# Patient Record
Sex: Male | Born: 1955 | Race: Black or African American | Hispanic: No | Marital: Single | Smoking: Current every day smoker
Health system: Southern US, Community
[De-identification: ages and names within clinical notes are randomized; demographics above are authoritative.]

## PROBLEM LIST (undated history)

## (undated) DIAGNOSIS — F1010 Alcohol abuse, uncomplicated: Secondary | ICD-10-CM

## (undated) DIAGNOSIS — I10 Essential (primary) hypertension: Secondary | ICD-10-CM

## (undated) DIAGNOSIS — N400 Enlarged prostate without lower urinary tract symptoms: Secondary | ICD-10-CM

## (undated) DIAGNOSIS — F1020 Alcohol dependence, uncomplicated: Secondary | ICD-10-CM

## (undated) DIAGNOSIS — K5790 Diverticulosis of intestine, part unspecified, without perforation or abscess without bleeding: Secondary | ICD-10-CM

## (undated) DIAGNOSIS — F329 Major depressive disorder, single episode, unspecified: Secondary | ICD-10-CM

---

## 2016-12-01 ENCOUNTER — Encounter: Payer: Self-pay | Admitting: Nurse Practitioner

## 2016-12-14 ENCOUNTER — Ambulatory Visit: Payer: Self-pay | Admitting: Nurse Practitioner

## 2017-05-02 ENCOUNTER — Emergency Department (HOSPITAL_COMMUNITY)
Admission: EM | Admit: 2017-05-02 | Discharge: 2017-05-02 | Disposition: A | Discharge status: Home / Self Care | Payer: Medicaid Other | Attending: Emergency Medicine | Admitting: Emergency Medicine

## 2017-05-02 ENCOUNTER — Emergency Department (HOSPITAL_COMMUNITY): Payer: Medicaid Other

## 2017-05-02 ENCOUNTER — Encounter (HOSPITAL_COMMUNITY): Payer: Self-pay | Admitting: Emergency Medicine

## 2017-05-02 DIAGNOSIS — R1030 Lower abdominal pain, unspecified: Secondary | ICD-10-CM | POA: Insufficient documentation

## 2017-05-02 DIAGNOSIS — Z79899 Other long term (current) drug therapy: Secondary | ICD-10-CM | POA: Insufficient documentation

## 2017-05-02 DIAGNOSIS — F17210 Nicotine dependence, cigarettes, uncomplicated: Secondary | ICD-10-CM | POA: Insufficient documentation

## 2017-05-02 DIAGNOSIS — I10 Essential (primary) hypertension: Secondary | ICD-10-CM | POA: Diagnosis not present

## 2017-05-02 HISTORY — DX: Benign prostatic hyperplasia without lower urinary tract symptoms: N40.0

## 2017-05-02 HISTORY — DX: Diverticulosis of intestine, part unspecified, without perforation or abscess without bleeding: K57.90

## 2017-05-02 HISTORY — DX: Essential (primary) hypertension: I10

## 2017-05-02 LAB — CBC
HEMATOCRIT: 48.3 % (ref 39.0–52.0)
HEMOGLOBIN: 16.2 g/dL (ref 13.0–17.0)
MCH: 32.5 pg (ref 26.0–34.0)
MCHC: 33.5 g/dL (ref 30.0–36.0)
MCV: 97 fL (ref 78.0–100.0)
Platelets: 140 10*3/uL — ABNORMAL LOW (ref 150–400)
RBC: 4.98 MIL/uL (ref 4.22–5.81)
RDW: 17.6 % — AB (ref 11.5–15.5)
WBC: 5.1 10*3/uL (ref 4.0–10.5)

## 2017-05-02 LAB — URINALYSIS, ROUTINE W REFLEX MICROSCOPIC
BACTERIA UA: NONE SEEN
GLUCOSE, UA: NEGATIVE mg/dL
Ketones, ur: 80 mg/dL — AB
LEUKOCYTES UA: NEGATIVE
NITRITE: NEGATIVE
PH: 5 (ref 5.0–8.0)
PROTEIN: 100 mg/dL — AB
Specific Gravity, Urine: 1.027 (ref 1.005–1.030)

## 2017-05-02 LAB — COMPREHENSIVE METABOLIC PANEL
ALT: 77 U/L — ABNORMAL HIGH (ref 17–63)
ANION GAP: 13 (ref 5–15)
AST: 111 U/L — ABNORMAL HIGH (ref 15–41)
Albumin: 4.4 g/dL (ref 3.5–5.0)
Alkaline Phosphatase: 129 U/L — ABNORMAL HIGH (ref 38–126)
BILIRUBIN TOTAL: 2.2 mg/dL — AB (ref 0.3–1.2)
BUN: 15 mg/dL (ref 6–20)
CO2: 27 mmol/L (ref 22–32)
Calcium: 9.4 mg/dL (ref 8.9–10.3)
Chloride: 94 mmol/L — ABNORMAL LOW (ref 101–111)
Creatinine, Ser: 1.34 mg/dL — ABNORMAL HIGH (ref 0.61–1.24)
GFR calc Af Amer: >60 mL/min (ref >60)
GFR, EST NON AFRICAN AMERICAN: 55 mL/min — AB (ref >60)
Glucose, Bld: 93 mg/dL (ref 65–99)
POTASSIUM: 4 mmol/L (ref 3.5–5.1)
Sodium: 134 mmol/L — ABNORMAL LOW (ref 135–145)
TOTAL PROTEIN: 8.4 g/dL — AB (ref 6.5–8.1)

## 2017-05-02 LAB — LIPASE, BLOOD: Lipase: 64 U/L — ABNORMAL HIGH (ref 11–51)

## 2017-05-02 MED ORDER — ONDANSETRON 4 MG PO TBDP
4.0000 mg | ORAL_TABLET | Freq: Once | ORAL | Status: AC | PRN
  Administered 2017-05-02: 4 mg via ORAL
  Filled 2017-05-02: qty 1

## 2017-05-02 MED ORDER — ONDANSETRON 4 MG PO TBDP: ORAL_TABLET | ORAL | 0 refills | Status: DC

## 2017-05-02 MED ORDER — PANTOPRAZOLE SODIUM 20 MG PO TBEC: 20.0000 mg | DELAYED_RELEASE_TABLET | Freq: Every day | ORAL | 0 refills | Status: DC

## 2017-05-02 MED ORDER — TRAMADOL HCL 50 MG PO TABS: 50.0000 mg | ORAL_TABLET | Freq: Four times a day (QID) | ORAL | 0 refills | Status: DC | PRN

## 2017-05-02 MED ORDER — SODIUM CHLORIDE 0.9 % IV BOLUS (SEPSIS)
1000.0000 mL | Freq: Once | INTRAVENOUS | Status: AC
  Administered 2017-05-02: 1000 mL via INTRAVENOUS

## 2017-05-02 MED ORDER — PANTOPRAZOLE SODIUM 40 MG IV SOLR
40.0000 mg | Freq: Once | INTRAVENOUS | Status: AC
  Administered 2017-05-02: 40 mg via INTRAVENOUS
  Filled 2017-05-02: qty 40

## 2017-05-02 NOTE — ED Provider Notes (Signed)
[facility]-EMERGENCY DEPT Provider Note   CSN: [id number] Arrival date & time: 05/02/17  4132     History   Chief Complaint Chief Complaint  Patient presents with  . Flank Pain  . Diarrhea    HPI Larry Byrd is a 61 y.o. male.  Patient complains of some lower abdominal pain.  He has a history of alcohol abuse and has been drinking alcohol again   The history is provided by the patient.  Flank Pain  This is a recurrent problem. The current episode started 2 days ago. The problem occurs constantly. The problem has not changed since onset.Pertinent negatives include no chest pain, no abdominal pain and no headaches. Nothing aggravates the symptoms. Nothing relieves the symptoms. He has tried nothing for the symptoms. The treatment provided no relief.  Diarrhea   Pertinent negatives include no abdominal pain, no headaches and no cough.    Past Medical History:  Diagnosis Date  . Diverticulosis   . Enlarged prostate   . Hypertension     There are no active problems to display for this patient.   History reviewed. No pertinent surgical history.     Home Medications    Prior to Admission medications   Medication Sig Start Date End Date Taking? Authorizing Provider  gabapentin (NEURONTIN) 300 MG capsule Take 300-1,200 mg by mouth as directed. Take  by mouth after lunch.  after supper, and  at bedtime 03/11/17  Yes [provider]  lisinopril-hydrochlorothiazide (PRINZIDE,ZESTORETIC) 20-12.5 MG tablet Take 1 tablet by mouth daily. 03/11/17  Yes [provider]  sertraline (ZOLOFT) 50 MG tablet Take 50 mg by mouth daily. 03/11/17  Yes [provider]  traZODone (DESYREL) 150 MG tablet Take 150-300 mg by mouth at bedtime. 03/11/17  Yes [provider]  ondansetron (ZOFRAN ODT) 4 MG disintegrating tablet  ODT q4 hours prn nausea/vomit 05/02/17   Bethann Berkshire, MD  pantoprazole (PROTONIX) 20 MG tablet Take 1 tablet (20 mg total)  by mouth daily. 05/02/17   Bethann Berkshire, MD  traMADol (ULTRAM) 50 MG tablet Take 1 tablet (50 mg total) by mouth every 6 (six) hours as needed. 05/02/17   Bethann Berkshire, MD    Family History No family history on file.  Social History Social History  Substance Use Topics  . Smoking status: Current Every Day Smoker    Types: Cigarettes  . Smokeless tobacco: Never Used  . Alcohol use Yes     Allergies   Patient has no known allergies.   Review of Systems Review of Systems  Constitutional: Negative for appetite change and fatigue.  HENT: Negative for congestion, ear discharge and sinus pressure.   Eyes: Negative for discharge.  Respiratory: Negative for cough.   Cardiovascular: Negative for chest pain.  Gastrointestinal: Positive for diarrhea. Negative for abdominal pain.  Genitourinary: Positive for flank pain. Negative for frequency and hematuria.  Musculoskeletal: Negative for back pain.  Skin: Negative for rash.  Neurological: Negative for seizures and headaches.  Psychiatric/Behavioral: Negative for hallucinations.     Physical Exam Updated Vital Signs BP (!) 156/87   Pulse 66   Temp 98.3 F (36.8 C) (Oral)   Resp 18   SpO2 100%   Physical Exam  Constitutional: He is oriented to person, place, and time. He appears well-developed.  HENT:  Head: Normocephalic.  Eyes: Conjunctivae and EOM are normal. No scleral icterus.  Neck: Neck supple. No thyromegaly present.  Cardiovascular: Normal rate and regular rhythm.  Exam reveals no gallop and  no friction rub.   No murmur heard. Pulmonary/Chest: No stridor. He has no wheezes. He has no rales. He exhibits no tenderness.  Abdominal: He exhibits no distension. There is no tenderness. There is no rebound.  Musculoskeletal: Normal range of motion. He exhibits no edema.  Lymphadenopathy:    He has no cervical adenopathy.  Neurological: He is oriented to person, place, and time. He exhibits normal muscle tone.  Coordination normal.  Skin: No rash noted. No erythema.  Psychiatric: He has a normal mood and affect. His behavior is normal.     ED Treatments / Results  Labs (all labs ordered are listed, but only abnormal results are displayed) Labs Reviewed  LIPASE, BLOOD - Abnormal; Notable for the following:       Result Value   Lipase 64 (*)    All other components within normal limits  COMPREHENSIVE METABOLIC PANEL - Abnormal; Notable for the following:    Sodium 134 (*)    Chloride 94 (*)    Creatinine, Ser 1.34 (*)    Total Protein 8.4 (*)    AST 111 (*)    ALT 77 (*)    Alkaline Phosphatase 129 (*)    Total Bilirubin 2.2 (*)    GFR calc non Af Amer 55 (*)    All other components within normal limits  CBC - Abnormal; Notable for the following:    RDW 17.6 (*)    Platelets 140 (*)    All other components within normal limits  URINALYSIS, ROUTINE W REFLEX MICROSCOPIC - Abnormal; Notable for the following:    Color, Urine AMBER (*)    APPearance HAZY (*)    Hgb urine dipstick SMALL (*)    Bilirubin Urine SMALL (*)    Ketones, ur 80 (*)    Protein, ur 100 (*)    Squamous Epithelial / LPF 0-5 (*)    All other components within normal limits    EKG  EKG Interpretation None       Radiology Ct Renal Stone Study  Result Date: 05/02/2017 CLINICAL DATA:  61 year old male with a history of flank pain and diarrhea EXAM: CT ABDOMEN AND PELVIS WITHOUT CONTRAST TECHNIQUE: Multidetector CT imaging of the abdomen and pelvis was performed following the standard protocol without IV contrast. COMPARISON:  None. FINDINGS: Lower chest: No acute abnormality. Hepatobiliary: Diffusely decreased attenuation of liver parenchyma. Unremarkable gallbladder. No intrahepatic or extrahepatic biliary ductal dilatation Pancreas: Unremarkable. No pancreatic ductal dilatation or surrounding inflammatory changes. Spleen: Normal in size without focal abnormality. Adrenals/Urinary Tract: Unremarkable appearance  of the adrenal glands. Right kidney unremarkable with no hydronephrosis. No nephrolithiasis. Unremarkable course of the right ureter. Left kidney without hydronephrosis. Nonobstructive stone at the inferior collecting system of the left kidney measures 2 mm - 3 mm. Unremarkable course of the left ureter with no inflammatory changes. Urinary bladder circumferentially thickened. Stomach/Bowel: Small hiatal hernia. Unremarkable stomach. Unremarkable appearance of small bowel without dilation. No dilated colon. Colonic diverticular change without evidence of associated inflammatory changes. Appendix is not visualized, however, no inflammatory changes are present adjacent to the cecum to indicate an appendicitis. Vascular/Lymphatic: No significant calcifications of the abdominal aorta. No adenopathy Reproductive: Transverse diameter of the prostate measures 5.0 cm. No significant calcifications. Other: No abdominal wall hernia. Musculoskeletal: Negative for acute displaced fracture. Multilevel degenerative changes of the spine. IMPRESSION: No acute intra-abdominal finding. Nonobstructive left-sided nephrolithiasis with a small 2 mm - 3 mm stone at the inferior collecting system. Circumferential urinary bladder wall  thickening may reflect cystitis, or alternatively chronic bladder outlet obstruction. Prostate diameter measures 5.0 cm. Liver steatosis. Colonic diverticular disease without evidence of acute diverticulitis. Electronically Signed   By: Gilmer Mor D.O.   On: 05/02/2017 13:10    Procedures Procedures (including critical care time)  Medications Ordered in ED Medications  ondansetron (ZOFRAN-ODT) disintegrating tablet 4 mg (4 mg Oral Given 05/02/17 0934)  sodium chloride 0.9 % bolus 1,000 mL (0 mLs Intravenous Stopped 05/02/17 1340)  pantoprazole (PROTONIX) injection 40 mg (40 mg Intravenous Given 05/02/17 1158)     Initial Impression / Assessment and Plan / ED Course  I have reviewed the triage  vital signs and the nursing notes.  Pertinent labs & imaging results that were available during my care of the patient were reviewed by me and considered in my medical decision making (see chart for details).     Patient has mild elevation of lipase. CT scan unremarkable. Patient improved with pain medicine AND medicine he will be put on protonic and will follow-up with his PCP  Final Clinical Impressions(s) / ED Diagnoses   Final diagnoses:  Lower abdominal pain    New Prescriptions New Prescriptions   ONDANSETRON (ZOFRAN ODT) 4 MG DISINTEGRATING TABLET     ODT q4 hours prn nausea/vomit   PANTOPRAZOLE (PROTONIX) 20 MG TABLET    Take 1 tablet (20 mg total) by mouth daily.   TRAMADOL (ULTRAM) 50 MG TABLET    Take 1 tablet (50 mg total) by mouth every 6 (six) hours as needed.     Bethann Berkshire, MD 05/02/17 [phone]

## 2017-05-02 NOTE — Discharge Instructions (Signed)
Follow up with your md next week for recheck.  Do not drink alcohol

## 2017-05-02 NOTE — ED Triage Notes (Signed)
Patient reports that he started back drinking ETOH after being successful in program. Patient c/o right flank pain and diarrhea for several days.

## 2017-05-02 NOTE — ED Notes (Signed)
Bed: WTR7 Expected date:  Expected time:  Means of arrival:  Comments: 

## 2017-05-02 NOTE — ED Notes (Signed)
Bed: [facility] Expected date:  Expected time:  Means of arrival:  Comments: 

## 2017-07-12 ENCOUNTER — Encounter (HOSPITAL_COMMUNITY): Payer: Self-pay | Admitting: Emergency Medicine

## 2017-07-12 DIAGNOSIS — Z79899 Other long term (current) drug therapy: Secondary | ICD-10-CM | POA: Diagnosis not present

## 2017-07-12 DIAGNOSIS — M79602 Pain in left arm: Secondary | ICD-10-CM | POA: Insufficient documentation

## 2017-07-12 DIAGNOSIS — E876 Hypokalemia: Secondary | ICD-10-CM | POA: Diagnosis not present

## 2017-07-12 DIAGNOSIS — I10 Essential (primary) hypertension: Secondary | ICD-10-CM | POA: Insufficient documentation

## 2017-07-12 DIAGNOSIS — F17210 Nicotine dependence, cigarettes, uncomplicated: Secondary | ICD-10-CM | POA: Diagnosis not present

## 2017-07-12 DIAGNOSIS — R51 Headache: Secondary | ICD-10-CM | POA: Insufficient documentation

## 2017-07-12 LAB — COMPREHENSIVE METABOLIC PANEL
ALBUMIN: 4.2 g/dL (ref 3.5–5.0)
ALT: 22 U/L (ref 17–63)
ANION GAP: 14 (ref 5–15)
AST: 45 U/L — ABNORMAL HIGH (ref 15–41)
Alkaline Phosphatase: 110 U/L (ref 38–126)
BILIRUBIN TOTAL: 1 mg/dL (ref 0.3–1.2)
BUN: 10 mg/dL (ref 6–20)
CO2: 24 mmol/L (ref 22–32)
Calcium: 9.3 mg/dL (ref 8.9–10.3)
Chloride: 96 mmol/L — ABNORMAL LOW (ref 101–111)
Creatinine, Ser: 1.93 mg/dL — ABNORMAL HIGH (ref 0.61–1.24)
GFR calc Af Amer: 41 mL/min — ABNORMAL LOW (ref >60)
GFR calc non Af Amer: 36 mL/min — ABNORMAL LOW (ref >60)
GLUCOSE: 128 mg/dL — AB (ref 65–99)
POTASSIUM: 3 mmol/L — AB (ref 3.5–5.1)
Sodium: 134 mmol/L — ABNORMAL LOW (ref 135–145)
TOTAL PROTEIN: 7.7 g/dL (ref 6.5–8.1)

## 2017-07-12 LAB — CBC
HEMATOCRIT: 43.1 % (ref 39.0–52.0)
HEMOGLOBIN: 14.8 g/dL (ref 13.0–17.0)
MCH: 33.3 pg (ref 26.0–34.0)
MCHC: 34.3 g/dL (ref 30.0–36.0)
MCV: 97.1 fL (ref 78.0–100.0)
Platelets: 191 10*3/uL (ref 150–400)
RBC: 4.44 MIL/uL (ref 4.22–5.81)
RDW: 15.6 % — ABNORMAL HIGH (ref 11.5–15.5)
WBC: 5.5 10*3/uL (ref 4.0–10.5)

## 2017-07-12 LAB — URINALYSIS, ROUTINE W REFLEX MICROSCOPIC
Bacteria, UA: NONE SEEN
Bilirubin Urine: NEGATIVE
Glucose, UA: 150 mg/dL — AB
KETONES UR: NEGATIVE mg/dL
Leukocytes, UA: NEGATIVE
NITRITE: NEGATIVE
PROTEIN: 30 mg/dL — AB
Specific Gravity, Urine: 1.016 (ref 1.005–1.030)
pH: 5 (ref 5.0–8.0)

## 2017-07-12 LAB — LIPASE, BLOOD: Lipase: 41 U/L (ref 11–51)

## 2017-07-12 NOTE — ED Triage Notes (Signed)
Pt presents with multiple complaints, states he has had rectal bleeding X several months, reports L hand pain states he has tendonitis, also reports HA. States he wants a new PCP and has came here until then.

## 2017-07-13 ENCOUNTER — Emergency Department (HOSPITAL_COMMUNITY)
Admission: EM | Admit: 2017-07-13 | Discharge: 2017-07-13 | Disposition: A | Discharge status: Home / Self Care | Payer: Medicaid Other | Attending: Emergency Medicine | Admitting: Emergency Medicine

## 2017-07-13 DIAGNOSIS — R51 Headache: Secondary | ICD-10-CM

## 2017-07-13 DIAGNOSIS — G8929 Other chronic pain: Secondary | ICD-10-CM

## 2017-07-13 DIAGNOSIS — M79602 Pain in left arm: Secondary | ICD-10-CM

## 2017-07-13 DIAGNOSIS — E876 Hypokalemia: Secondary | ICD-10-CM

## 2017-07-13 LAB — POC OCCULT BLOOD, ED: Fecal Occult Bld: NEGATIVE

## 2017-07-13 MED ORDER — GABAPENTIN 300 MG PO CAPS
300.0000 mg | ORAL_CAPSULE | Freq: Once | ORAL | Status: AC
  Administered 2017-07-13: 300 mg via ORAL
  Filled 2017-07-13: qty 1

## 2017-07-13 MED ORDER — HYDROCODONE-ACETAMINOPHEN 5-325 MG PO TABS
1.0000 | ORAL_TABLET | Freq: Once | ORAL | Status: AC
  Administered 2017-07-13: 1 via ORAL
  Filled 2017-07-13: qty 1

## 2017-07-13 MED ORDER — POTASSIUM CHLORIDE CRYS ER 20 MEQ PO TBCR: 40.0000 meq | EXTENDED_RELEASE_TABLET | Freq: Every day | ORAL | 0 refills | Status: DC

## 2017-07-13 NOTE — ED Provider Notes (Signed)
[facility]   CSN: [id number] Arrival date & time: 07/12/17  2015     History   Chief Complaint Chief Complaint  Patient presents with  . Multiple Complaints    HPI Gillermina PhyWilliam Bamber is a 61 y.o. male.  HPI  This is a 61 year old male with a history of diverticulosis hypertension who presents with multiple complaints.  Patient initially complains of left arm pain.  He reports shooting pain from his elbow to his wrist.  He states that he was diagnosed with tendinitis by his primary physician.  He was given a cream that seemed to help some.  However, the pain returns.  Patient also reports multiple months of bloody stools.  He states that it is streaks of blood.  Denies any abdominal pain, nausea, vomiting.  Denies any dark tarry stools.  History of diverticulosis but has not seen a GI doctor.  Denies any dizziness or syncope.  Patient also complains of left-sided headache.  This is been ongoing for several months.  He states "I feel like something is in my ear."  Patient rates pain at 8 out of 10.  He has not taken anything additional for pain.  When asked whether he has seen his primary physician.  He states that "I do not feel like he is doing anything for me."  Past Medical History:  Diagnosis Date  . Diverticulosis   . Enlarged prostate   . Hypertension     There are no active problems to display for this patient.   History reviewed. No pertinent surgical history.     Home Medications    Prior to Admission medications   Medication Sig Start Date End Date Taking? Authorizing Provider  gabapentin (NEURONTIN) 300 MG capsule Take 300-1,200 mg by mouth as directed. Take 300mg  by mouth after lunch. 300mg  after supper, and 1200mg  at bedtime 03/11/17  Yes [provider]  lisinopril-hydrochlorothiazide (PRINZIDE,ZESTORETIC) 20-12.5 MG tablet Take 1 tablet by mouth daily. 03/11/17  Yes [provider]    pantoprazole (PROTONIX) 20 MG tablet Take 1 tablet (20 mg total) by mouth daily. 05/02/17  Yes Bethann BerkshireZammit, Joseph, MD  sertraline (ZOLOFT) 50 MG tablet Take 50 mg by mouth daily. 03/11/17  Yes [provider]  traZODone (DESYREL) 150 MG tablet Take 150-300 mg by mouth at bedtime. 03/11/17  Yes [provider]  ondansetron (ZOFRAN ODT) 4 MG disintegrating tablet 4mg  ODT q4 hours prn nausea/vomit Patient not taking: Reported on 07/13/2017 05/02/17   Bethann BerkshireZammit, Joseph, MD  potassium chloride SA (K-DUR,KLOR-CON) 20 MEQ tablet Take 2 tablets (40 mEq total) by mouth daily. 07/13/17   Zylah Elsbernd, Mayer Maskerourtney F, MD  traMADol (ULTRAM) 50 MG tablet Take 1 tablet (50 mg total) by mouth every 6 (six) hours as needed. Patient not taking: Reported on 07/13/2017 05/02/17   Bethann BerkshireZammit, Joseph, MD    Family History No family history on file.  Social History Social History   Tobacco Use  . Smoking status: Current Every Day Smoker    Types: Cigarettes  . Smokeless tobacco: Never Used  Substance Use Topics  . Alcohol use: Yes  . Drug use: Not on file     Allergies   Patient has no known allergies.   Review of Systems Review of Systems  Constitutional: Negative for fever.  HENT: Positive for ear pain.   Respiratory: Negative for shortness of breath.   Cardiovascular: Negative for chest pain.  Gastrointestinal: Positive for blood in stool. Negative for abdominal  pain, constipation, diarrhea and vomiting.  Musculoskeletal:       Left arm pain  Neurological: Positive for headaches.  All other systems reviewed and are negative.    Physical Exam Updated Vital Signs BP (!) 148/87   Pulse 84   Temp 97.8 F (36.6 C) (Oral)   Resp 18   Ht 5\' 7"  (1.702 m)   Wt 68 kg (150 lb)   SpO2 100%   BMI 23.49 kg/m   Physical Exam  Constitutional: He is oriented to person, place, and time. He appears well-developed and well-nourished. No distress.  HENT:  Head: Normocephalic and atraumatic.  Right  Ear: External ear normal.  Left Ear: External ear normal.  TMs clear, no foreign bodies  Eyes: EOM are normal. Pupils are equal, round, and reactive to light.  Neck: Neck supple.  Cardiovascular: Normal rate, regular rhythm and normal heart sounds.  No murmur heard. Pulmonary/Chest: Effort normal and breath sounds normal. No respiratory distress. He has no wheezes.  Abdominal: Soft. Bowel sounds are normal. There is no tenderness. There is no rebound.  Genitourinary:  Genitourinary Comments: Normal rectal tone, no gross blood  Musculoskeletal: He exhibits no edema.  Normal range of motion of the left elbow and wrist, no obvious deformities, no overlying skin changes, 2+ radial pulse  Neurological: He is alert and oriented to person, place, and time.  Skin: Skin is warm and dry.  Psychiatric: He has a normal mood and affect.  Nursing note and vitals reviewed.    ED Treatments / Results  Labs (all labs ordered are listed, but only abnormal results are displayed) Labs Reviewed  COMPREHENSIVE METABOLIC PANEL - Abnormal; Notable for the following components:      Result Value   Sodium 134 (*)    Potassium 3.0 (*)    Chloride 96 (*)    Glucose, Bld 128 (*)    Creatinine, Ser 1.93 (*)    AST 45 (*)    GFR calc non Af Amer 36 (*)    GFR calc Af Amer 41 (*)    All other components within normal limits  CBC - Abnormal; Notable for the following components:   RDW 15.6 (*)    All other components within normal limits  URINALYSIS, ROUTINE W REFLEX MICROSCOPIC - Abnormal; Notable for the following components:   APPearance HAZY (*)    Glucose, UA 150 (*)    Hgb urine dipstick MODERATE (*)    Protein, ur 30 (*)    Squamous Epithelial / LPF 0-5 (*)    All other components within normal limits  LIPASE, BLOOD  POC OCCULT BLOOD, ED    EKG  EKG Interpretation None       Radiology No results found.  Procedures Procedures (including critical care time)  Medications Ordered in  ED Medications  HYDROcodone-acetaminophen (NORCO/VICODIN) 5-325 MG per tablet 1 tablet (1 tablet Oral Given 07/13/17 0622)  gabapentin (NEURONTIN) capsule 300 mg (300 mg Oral Given 07/13/17 [id number])     Initial Impression / Assessment and Plan / ED Course  I have reviewed the triage vital signs and the nursing notes.  Pertinent labs & imaging results that were available during my care of the patient were reviewed by me and considered in my medical decision making (see chart for details).     Patient presents with multiple complaints.  Most of them appear chronic in nature.  His exam is reassuring.  He has no gross blood and his hemoglobin is stable.  He does have mild hypokalemia which is likely unrelated.  Patient was given Norco and gabapentin.  Doubt acute emergent process.  Patient was given 5 days of potassium supplementation.  Recommend follow-up with PCP.  After history, exam, and medical workup I feel the patient has been appropriately medically screened and is safe for discharge home. Pertinent diagnoses were discussed with the patient. Patient was given return precautions.   Final Clinical Impressions(s) / ED Diagnoses   Final diagnoses:  Chronic nonintractable headache, unspecified headache type  Left arm pain  Hypokalemia    ED Discharge Orders        Ordered    potassium chloride SA (K-DUR,KLOR-CON) 20 MEQ tablet  Daily     07/13/17 0637       Shon Baton, MD 07/13/17 [phone]

## 2017-07-13 NOTE — Discharge Instructions (Signed)
You were seen today for multiple complaints.  Your testing for blood in her stools was negative.  Your hemoglobin is reassuring.  Your potassium was mildly diminished.  Take potassium supplementation.  Follow-up with your primary physician for recheck.  Most of your complaints are chronic in nature.  Close follow-up with primary physician and continue medications as directed.

## 2017-07-14 ENCOUNTER — Encounter (HOSPITAL_COMMUNITY): Payer: Self-pay | Admitting: Emergency Medicine

## 2017-07-14 DIAGNOSIS — M79602 Pain in left arm: Secondary | ICD-10-CM | POA: Insufficient documentation

## 2017-07-14 DIAGNOSIS — F17210 Nicotine dependence, cigarettes, uncomplicated: Secondary | ICD-10-CM | POA: Insufficient documentation

## 2017-07-14 DIAGNOSIS — M25562 Pain in left knee: Secondary | ICD-10-CM | POA: Diagnosis present

## 2017-07-14 DIAGNOSIS — R51 Headache: Secondary | ICD-10-CM | POA: Diagnosis not present

## 2017-07-14 DIAGNOSIS — I10 Essential (primary) hypertension: Secondary | ICD-10-CM | POA: Insufficient documentation

## 2017-07-14 DIAGNOSIS — Z760 Encounter for issue of repeat prescription: Secondary | ICD-10-CM | POA: Diagnosis not present

## 2017-07-14 DIAGNOSIS — Z79899 Other long term (current) drug therapy: Secondary | ICD-10-CM | POA: Insufficient documentation

## 2017-07-14 DIAGNOSIS — G8929 Other chronic pain: Secondary | ICD-10-CM | POA: Diagnosis not present

## 2017-07-14 NOTE — ED Triage Notes (Addendum)
Patient c/o right arm pain that "has been addressed by many doctors." pt also having left knee pain and tingling. Pt states he was told he has tinnitus. Pt adds he has an intermittent headache that has been going on for a while but does not want any blood work done but thinks he needs a scan of his head.

## 2017-07-15 ENCOUNTER — Emergency Department (HOSPITAL_COMMUNITY): Payer: Medicaid Other

## 2017-07-15 ENCOUNTER — Emergency Department (HOSPITAL_COMMUNITY)
Admission: EM | Admit: 2017-07-15 | Discharge: 2017-07-15 | Disposition: A | Discharge status: Home / Self Care | Payer: Medicaid Other | Attending: Emergency Medicine | Admitting: Emergency Medicine

## 2017-07-15 DIAGNOSIS — M25562 Pain in left knee: Secondary | ICD-10-CM

## 2017-07-15 DIAGNOSIS — M79602 Pain in left arm: Secondary | ICD-10-CM

## 2017-07-15 DIAGNOSIS — Z760 Encounter for issue of repeat prescription: Secondary | ICD-10-CM

## 2017-07-15 DIAGNOSIS — G8929 Other chronic pain: Secondary | ICD-10-CM

## 2017-07-15 DIAGNOSIS — R51 Headache: Secondary | ICD-10-CM

## 2017-07-15 MED ORDER — LISINOPRIL-HYDROCHLOROTHIAZIDE 20-12.5 MG PO TABS: 1.0000 | ORAL_TABLET | Freq: Every day | ORAL | 0 refills | Status: DC

## 2017-07-15 MED ORDER — KETOROLAC TROMETHAMINE 30 MG/ML IJ SOLN
30.0000 mg | Freq: Once | INTRAMUSCULAR | Status: AC
  Administered 2017-07-15: 30 mg via INTRAMUSCULAR
  Filled 2017-07-15: qty 1

## 2017-07-15 NOTE — ED Notes (Signed)
Pt is c/o left arm pain and left leg pain. Pt is ambulatory. Pt stated he fell a month ago and then developed this pain. PMS in tact.

## 2017-07-15 NOTE — ED Provider Notes (Signed)
[facility]-EMERGENCY DEPT Provider Note   CSN: [id number] Arrival date & time: 07/14/17  2238     History   Chief Complaint Chief Complaint  Patient presents with  . Arm Pain  . Knee Pain  . Headache    HPI Larry Byrd is a 61 y.o. male with a hx of HTN, diverticulosis, enlarged prostate presents to the Emergency Department complaining of gradual, persistent, progressively worsening left arm pain onset more than 1 month ago.  Pt describes the pain as burning and shooting.  Pt reports he has been diagnosed with tendonitis by a physician.  Pt reports taking gabapentin for his pain wihtout relief.  (He reports he is only taking this intermittently and is not taking it as prescribed.) He reports that sometimes the pain is so bad he has trouble using the arm and drops objects, but denies weakness without the pain.  Nothing seems to make the pain worse.    Pt also reports left knee pain that sometimes "gives out."  Pt reports injuring the left knee approx 1 month ago and has had pain since that time.  He reports the initial injury was from a fall, but since that time he has had trouble with near falls due to this.  He denies additional injury to the left knee.  He reports standing for long periods of time makes the knee pain worse.  He reports taking ibuprofen sometimes for the pain and inflammation. Last dose was yesterday.  Pt reports left ear pain as well.  He reports some pain in his temple.  Pt reports this pain has been ongoing for more than 1 month.   He does report Aleve improves the pain.  Pt denies fever, chills, neck pain, chest pain, SOB, N/V/D, vision changes, speech changes, sensation changes.   Pt also reports he has not taken his BP medications in the last 4 days as he recently moved and he had to leave all his belongings.  Upon questioning he does admit to drinking alcohol tonight.   The history is provided by medical records and the patient. No  language interpreter was used.    Past Medical History:  Diagnosis Date  . Diverticulosis   . Enlarged prostate   . Hypertension     There are no active problems to display for this patient.   History reviewed. No pertinent surgical history.     Home Medications    Prior to Admission medications   Medication Sig Start Date End Date Taking? Authorizing Provider  cetirizine (ZYRTEC) 10 MG tablet Take 10 mg by mouth daily.   Yes [provider]  gabapentin (NEURONTIN) 300 MG capsule Take 300-1,200 mg by mouth as directed. Take 300mg  by mouth after lunch. 300mg  after supper, and 1200mg  at bedtime 03/11/17  Yes [provider]  pantoprazole (PROTONIX) 20 MG tablet Take 1 tablet (20 mg total) by mouth daily. 05/02/17  Yes Bethann Berkshire, MD  potassium chloride SA (K-DUR,KLOR-CON) 20 MEQ tablet Take 2 tablets (40 mEq total) by mouth daily. 07/13/17  Yes Horton, Mayer Masker, MD  sertraline (ZOLOFT) 50 MG tablet Take 50 mg by mouth daily. 03/11/17  Yes [provider]  traZODone (DESYREL) 150 MG tablet Take 150-300 mg by mouth at bedtime. 03/11/17  Yes [provider]  lisinopril-hydrochlorothiazide (PRINZIDE,ZESTORETIC) 20-12.5 MG tablet Take 1 tablet by mouth daily. 07/15/17   Benjamim Harnish, Dahlia Client, PA-C    Family History No family history on file.  Social History Social History  Tobacco Use  . Smoking status: Current Every Day Smoker    Types: Cigarettes  . Smokeless tobacco: Never Used  Substance Use Topics  . Alcohol use: Yes  . Drug use: Not on file     Allergies   Patient has no known allergies.   Review of Systems Review of Systems  Constitutional: Negative for appetite change, diaphoresis, fatigue, fever and unexpected weight change.  HENT: Negative for mouth sores.   Eyes: Negative for visual disturbance.  Respiratory: Negative for cough, chest tightness, shortness of breath and wheezing.   Cardiovascular: Negative for chest  pain.  Gastrointestinal: Negative for abdominal pain, constipation, diarrhea, nausea and vomiting.  Endocrine: Negative for polydipsia, polyphagia and polyuria.  Genitourinary: Negative for dysuria, frequency, hematuria and urgency.  Musculoskeletal: Positive for arthralgias and myalgias. Negative for back pain and neck stiffness.  Skin: Negative for rash.  Allergic/Immunologic: Negative for immunocompromised state.  Neurological: Positive for headaches. Negative for syncope and light-headedness.  Hematological: Does not bruise/bleed easily.  Psychiatric/Behavioral: Negative for sleep disturbance. The patient is not nervous/anxious.      Physical Exam Updated Vital Signs BP 119/85 (BP Location: Left Arm)   Pulse 75   Temp 97.8 F (36.6 C) (Oral)   Resp 18   SpO2 100%   Physical Exam  Constitutional: He appears well-developed and well-nourished. No distress.  Awake, alert, nontoxic appearance  HENT:  Head: Normocephalic and atraumatic.  Mouth/Throat: Oropharynx is clear and moist. No oropharyngeal exudate.  TMs clear bilaterally without effusion or foreign bodies Left temporal area without increased warmth, rash, erythema or tenderness.  No bruit.  Eyes: Conjunctivae are normal. No scleral icterus.  Neck: Normal range of motion. Neck supple.  Cardiovascular: Normal rate, regular rhythm and intact distal pulses.  Pulmonary/Chest: Effort normal and breath sounds normal. No respiratory distress. He has no wheezes.  Equal chest expansion  Abdominal: Soft. Bowel sounds are normal. He exhibits no mass. There is no tenderness. There is no rebound and no guarding.  Musculoskeletal: Normal range of motion. He exhibits no edema.  Full range of motion of the left shoulder, elbow, wrist and fingers.  Sensation intact and normal touch in the left upper extremity.  Upper extremity strength and grip strength 5/5 in the left.  No overlying skin changes.  Left lower extremity with full range of  motion of the left hip, knee and ankle.  No abnormal patellar movement or defect of the patellar tendon.  Patient is able to fully extend and flex.  Sensation intact to normal touch in the left lower extremity.  Strength 5/5 with flexion and extension.  Normal gait.  No overlying skin changes.  Neurological: He is alert.  Speech is clear and goal oriented Moves extremities without ataxia  Skin: Skin is warm and dry. He is not diaphoretic.  Psychiatric: He has a normal mood and affect.  Nursing note and vitals reviewed.    ED Treatments / Results   Radiology Dg Knee Complete 4 Views Left  Result Date: 07/15/2017 CLINICAL DATA:  Left knee pain after fall and twisting injury 1 month ago. EXAM: LEFT KNEE - COMPLETE 4+ VIEW COMPARISON:  None. FINDINGS: No evidence of fracture, dislocation, or joint effusion. Minimal spurring of tibial spines. Trace peripheral lateral tibiofemoral spurring. Joint spaces are preserved. Small patellar and quadriceps tendon enthesophytes. Soft tissues are unremarkable. 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  No fracture or subluxation. Electronically Signed   By: Rubye OaksMelanie  Ehinger M.D.   On: 07/15/2017 05:14  Procedures Procedures (including critical care time)  Medications Ordered in ED Medications  ketorolac (TORADOL) 30 MG/ML injection 30 mg (30 mg Intramuscular Given 07/15/17 0442)     Initial Impression / Assessment and Plan / ED Course  I have reviewed the triage vital signs and the nursing notes.  Pertinent labs & imaging results that were available during my care of the patient were reviewed by me and considered in my medical decision making (see chart for details).     Patient presents with numerous complaints.  They appear chronic in nature and all have been ongoing for greater than 1 month.  Physical exam of the left upper and left lower extremity are reassuring.  X-ray of the left knee is without acute abnormalities and  osteoarthritis is noted.  No acute trauma.  No evidence of septic joint.  Patient given knee brace.  He has been drinking and I am concerned that this may be a regular occurrence for him.  As of such, will not give NSAIDs or Tylenol.  Other conservative therapies discussed with patient.  He does continue to have a prescription for gabapentin.  I have instructed him on the importance of taking this as directed.  Patient also with mild headache, present for > 1 month.  Neurologically intact.  No clinical evidence of giant cell arteritis.  No clinical evidence of shingles.  No evidence of acute emergent process.  Patient complains of hypertension as he has not been taking his medications.  He is not hypertensive here today.  I however have refilled his hypertension medication as he is unable to get to them.  I have instructed the patient to follow-up with his primary care provider for further evaluation and treatment.  Final Clinical Impressions(s) / ED Diagnoses   Final diagnoses:  Chronic pain of left knee  Pain of left upper extremity  Medication refill  Chronic nonintractable headache, unspecified headache type    ED Discharge Orders        Ordered    lisinopril-hydrochlorothiazide (PRINZIDE,ZESTORETIC) 20-12.5 MG tablet  Daily     07/15/17 0556       Bernie Fobes, Dahlia ClientHannah, PA-C 07/15/17 0601    Palumbo, April, MD 07/15/17 [phone]

## 2017-07-15 NOTE — Discharge Instructions (Addendum)
1. Medications: usual home medications 2. Treatment: rest, alternate ice and heat, elevate and use brace, drink plenty of fluids, gentle stretching 3. Follow Up: Please followup with your PCP in 1 week if no improvement for discussion of your diagnoses and further evaluation after today's visit; if you do not have a primary care doctor use the resource guide provided to find one; Please return to the ER for worsening symptoms or other concerns

## 2017-08-10 ENCOUNTER — Encounter (HOSPITAL_COMMUNITY): Payer: Self-pay | Admitting: Emergency Medicine

## 2017-08-10 DIAGNOSIS — R51 Headache: Secondary | ICD-10-CM | POA: Insufficient documentation

## 2017-08-10 DIAGNOSIS — F17210 Nicotine dependence, cigarettes, uncomplicated: Secondary | ICD-10-CM | POA: Diagnosis not present

## 2017-08-10 DIAGNOSIS — Z79899 Other long term (current) drug therapy: Secondary | ICD-10-CM | POA: Insufficient documentation

## 2017-08-10 DIAGNOSIS — J069 Acute upper respiratory infection, unspecified: Secondary | ICD-10-CM | POA: Diagnosis not present

## 2017-08-10 DIAGNOSIS — I10 Essential (primary) hypertension: Secondary | ICD-10-CM | POA: Insufficient documentation

## 2017-08-10 DIAGNOSIS — B9789 Other viral agents as the cause of diseases classified elsewhere: Secondary | ICD-10-CM | POA: Insufficient documentation

## 2017-08-10 DIAGNOSIS — M546 Pain in thoracic spine: Secondary | ICD-10-CM | POA: Diagnosis present

## 2017-08-10 NOTE — ED Triage Notes (Signed)
Pt comes in with complaints of chronic back pain and chronic headaches.  Describes the headache as a knife being stabbed in his head. States no pain medications are helping.  Pt also reports he has had some thick sputum that he has been spit up. States he is unsure if he has been around any mold. [facility].

## 2017-08-11 ENCOUNTER — Emergency Department (HOSPITAL_COMMUNITY): Payer: Medicaid Other

## 2017-08-11 ENCOUNTER — Emergency Department (HOSPITAL_COMMUNITY)
Admission: EM | Admit: 2017-08-11 | Discharge: 2017-08-11 | Disposition: A | Discharge status: Home / Self Care | Payer: Medicaid Other | Attending: Emergency Medicine | Admitting: Emergency Medicine

## 2017-08-11 DIAGNOSIS — J069 Acute upper respiratory infection, unspecified: Secondary | ICD-10-CM

## 2017-08-11 MED ORDER — POTASSIUM CHLORIDE CRYS ER 20 MEQ PO TBCR: 40.0000 meq | EXTENDED_RELEASE_TABLET | Freq: Every day | ORAL | 0 refills | Status: DC

## 2017-08-11 MED ORDER — BENZONATATE 100 MG PO CAPS: 100.0000 mg | ORAL_CAPSULE | Freq: Three times a day (TID) | ORAL | 0 refills | Status: DC

## 2017-08-11 MED ORDER — LISINOPRIL-HYDROCHLOROTHIAZIDE 20-12.5 MG PO TABS: 1.0000 | ORAL_TABLET | Freq: Every day | ORAL | 0 refills | Status: DC

## 2017-08-11 NOTE — ED Notes (Signed)
Called  No response from lobby 

## 2017-08-11 NOTE — ED Provider Notes (Signed)
[facility]-EMERGENCY DEPT Provider Note   CSN: [id number] Arrival date & time: 08/10/17  2251     History   Chief Complaint Chief Complaint  Patient presents with  . Headache  . Back Pain    HPI Larry Byrd is a 62 y.o. male.  The history is provided by the patient. No language interpreter was used.  Headache   This is a new problem. The problem occurs constantly. The problem has been gradually worsening. The headache is associated with nothing. The quality of the pain is described as dull. The pain does not radiate. Pertinent negatives include no nausea and no vomiting. He has tried nothing for the symptoms. The treatment provided no relief.  Back Pain   Associated symptoms include headaches.  Pt reports he has been coughing and has a headache.  Pt reports he is out of his blood pressure medication and his potassium.   Past Medical History:  Diagnosis Date  . Diverticulosis   . Enlarged prostate   . Hypertension     There are no active problems to display for this patient.   History reviewed. No pertinent surgical history.     Home Medications    Prior to Admission medications   Medication Sig Start Date End Date Taking? Authorizing Provider  traZODone (DESYREL) 150 MG tablet Take 150-300 mg by mouth at bedtime. 03/11/17  Yes [provider]  benzonatate (TESSALON) 100 MG capsule Take 1 capsule (100 mg total) by mouth every 8 (eight) hours. 08/11/17   Elson AreasSofia, Snyder Colavito K, PA-C  lisinopril-hydrochlorothiazide (PRINZIDE,ZESTORETIC) 20-12.5 MG tablet Take 1 tablet by mouth daily. 08/11/17   Elson AreasSofia, Myrla Malanowski K, PA-C  potassium chloride SA (K-DUR,KLOR-CON) 20 MEQ tablet Take 2 tablets (40 mEq total) by mouth daily. 08/11/17   Elson AreasSofia, Lakaisha Danish K, PA-C    Family History No family history on file.  Social History Social History   Tobacco Use  . Smoking status: Current Every Day Smoker    Types: Cigarettes  . Smokeless tobacco: Never Used    Substance Use Topics  . Alcohol use: Yes  . Drug use: No     Allergies   Patient has no known allergies.   Review of Systems Review of Systems  Gastrointestinal: Negative for nausea and vomiting.  Musculoskeletal: Positive for back pain.  Neurological: Positive for headaches.  All other systems reviewed and are negative.    Physical Exam Updated Vital Signs BP (!) 141/82   Pulse 64   Temp 98.3 F (36.8 C) (Oral)   Resp 19   Ht 5' 10.5" (1.791 m)   Wt 68 kg (150 lb)   SpO2 100%   BMI 21.22 kg/m   Physical Exam  Constitutional: He is oriented to person, place, and time. He appears well-developed and well-nourished.  HENT:  Head: Normocephalic.  Eyes: Pupils are equal, round, and reactive to light.  Neck: Normal range of motion.  Cardiovascular: Normal rate.  Pulmonary/Chest: Effort normal.  Abdominal: Soft.  Musculoskeletal: Normal range of motion.  Neurological: He is alert and oriented to person, place, and time.  Skin: Skin is warm.  Psychiatric: He has a normal mood and affect.  Nursing note and vitals reviewed.    ED Treatments / Results  Labs (all labs ordered are listed, but only abnormal results are displayed) Labs Reviewed - No data to display  EKG  EKG Interpretation None       Radiology Dg Chest 2 View  Result Date: 08/11/2017 CLINICAL DATA:  Headache, cough and hypertension. EXAM: CHEST  2 VIEW COMPARISON:  None. FINDINGS: Heart size is normal. Mediastinal shadows are normal. The lungs are clear. No bronchial thickening. No infiltrate, mass, effusion or collapse. Pulmonary vascularity is normal. No bony abnormality. IMPRESSION: Normal chest Electronically Signed   By: Paulina Fusi M.D.   On: 08/11/2017 07:03    Procedures Procedures (including critical care time)  Medications Ordered in ED Medications - No data to display   Initial Impression / Assessment and Plan / ED Course  I have reviewed the triage vital signs and the nursing  notes.  Pertinent labs & imaging results that were available during my care of the patient were reviewed by me and considered in my medical decision making (see chart for details).       Final Clinical Impressions(s) / ED Diagnoses   Final diagnoses:  Viral upper respiratory tract infection    ED Discharge Orders        Ordered    lisinopril-hydrochlorothiazide (PRINZIDE,ZESTORETIC) 20-12.5 MG tablet  Daily     08/11/17 0729    potassium chloride SA (K-DUR,KLOR-CON) 20 MEQ tablet  Daily     08/11/17 0729    benzonatate (TESSALON) 100 MG capsule  Every 8 hours     08/11/17 0729    An After Visit Summary was printed and given to the patient.    Elson Areas, [place] 08/11/17 1408    Gerhard Munch, MD 08/11/17 [phone]

## 2017-08-11 NOTE — Discharge Instructions (Signed)
Return if any problems.

## 2017-09-09 ENCOUNTER — Encounter (HOSPITAL_COMMUNITY): Payer: Self-pay | Admitting: *Deleted

## 2017-09-09 ENCOUNTER — Emergency Department (HOSPITAL_COMMUNITY): Payer: Medicaid Other

## 2017-09-09 ENCOUNTER — Other Ambulatory Visit: Payer: Self-pay

## 2017-09-09 ENCOUNTER — Inpatient Hospital Stay (HOSPITAL_COMMUNITY)
Admission: EM | Admit: 2017-09-09 | Discharge: 2017-09-13 | DRG: 683 | Disposition: A | Discharge status: Home / Self Care | Payer: Medicaid Other | Attending: Family Medicine | Admitting: Family Medicine

## 2017-09-09 DIAGNOSIS — F17210 Nicotine dependence, cigarettes, uncomplicated: Secondary | ICD-10-CM | POA: Diagnosis present

## 2017-09-09 DIAGNOSIS — N179 Acute kidney failure, unspecified: Principal | ICD-10-CM | POA: Diagnosis present

## 2017-09-09 DIAGNOSIS — I10 Essential (primary) hypertension: Secondary | ICD-10-CM | POA: Diagnosis present

## 2017-09-09 DIAGNOSIS — R519 Headache, unspecified: Secondary | ICD-10-CM

## 2017-09-09 DIAGNOSIS — R42 Dizziness and giddiness: Secondary | ICD-10-CM | POA: Diagnosis present

## 2017-09-09 DIAGNOSIS — R079 Chest pain, unspecified: Secondary | ICD-10-CM | POA: Diagnosis present

## 2017-09-09 DIAGNOSIS — N183 Chronic kidney disease, stage 3 (moderate): Secondary | ICD-10-CM | POA: Diagnosis present

## 2017-09-09 DIAGNOSIS — M542 Cervicalgia: Secondary | ICD-10-CM | POA: Diagnosis present

## 2017-09-09 DIAGNOSIS — I129 Hypertensive chronic kidney disease with stage 1 through stage 4 chronic kidney disease, or unspecified chronic kidney disease: Secondary | ICD-10-CM | POA: Diagnosis present

## 2017-09-09 DIAGNOSIS — R51 Headache: Secondary | ICD-10-CM | POA: Diagnosis present

## 2017-09-09 DIAGNOSIS — Z6820 Body mass index (BMI) 20.0-20.9, adult: Secondary | ICD-10-CM

## 2017-09-09 DIAGNOSIS — I959 Hypotension, unspecified: Secondary | ICD-10-CM | POA: Diagnosis present

## 2017-09-09 DIAGNOSIS — N400 Enlarged prostate without lower urinary tract symptoms: Secondary | ICD-10-CM | POA: Diagnosis present

## 2017-09-09 DIAGNOSIS — E861 Hypovolemia: Secondary | ICD-10-CM | POA: Diagnosis present

## 2017-09-09 DIAGNOSIS — Z79899 Other long term (current) drug therapy: Secondary | ICD-10-CM

## 2017-09-09 DIAGNOSIS — D631 Anemia in chronic kidney disease: Secondary | ICD-10-CM | POA: Diagnosis present

## 2017-09-09 DIAGNOSIS — R55 Syncope and collapse: Secondary | ICD-10-CM | POA: Diagnosis present

## 2017-09-09 DIAGNOSIS — E871 Hypo-osmolality and hyponatremia: Secondary | ICD-10-CM | POA: Diagnosis present

## 2017-09-09 DIAGNOSIS — E440 Moderate protein-calorie malnutrition: Secondary | ICD-10-CM | POA: Diagnosis present

## 2017-09-09 LAB — CBC
HEMATOCRIT: 39.2 % (ref 39.0–52.0)
Hemoglobin: 13.2 g/dL (ref 13.0–17.0)
MCH: 34 pg (ref 26.0–34.0)
MCHC: 33.7 g/dL (ref 30.0–36.0)
MCV: 101 fL — AB (ref 78.0–100.0)
PLATELETS: 222 10*3/uL (ref 150–400)
RBC: 3.88 MIL/uL — ABNORMAL LOW (ref 4.22–5.81)
RDW: 14.8 % (ref 11.5–15.5)
WBC: 5.1 10*3/uL (ref 4.0–10.5)

## 2017-09-09 LAB — BASIC METABOLIC PANEL
Anion gap: 16 — ABNORMAL HIGH (ref 5–15)
BUN: 15 mg/dL (ref 6–20)
CHLORIDE: 91 mmol/L — AB (ref 101–111)
CO2: 22 mmol/L (ref 22–32)
CREATININE: 3.06 mg/dL — AB (ref 0.61–1.24)
Calcium: 9.2 mg/dL (ref 8.9–10.3)
GFR calc Af Amer: 23 mL/min — ABNORMAL LOW (ref >60)
GFR calc non Af Amer: 20 mL/min — ABNORMAL LOW (ref >60)
GLUCOSE: 107 mg/dL — AB (ref 65–99)
POTASSIUM: 3.6 mmol/L (ref 3.5–5.1)
Sodium: 129 mmol/L — ABNORMAL LOW (ref 135–145)

## 2017-09-09 LAB — I-STAT TROPONIN, ED: Troponin i, poc: 0.02 ng/mL (ref 0.00–0.08)

## 2017-09-09 NOTE — ED Triage Notes (Addendum)
Pt has been having pain at the base of his skull for the past several days. Reports pain radiates into head and down spine, causing his chest to hurt. Pt also having dizziness and feeling lightheaded when standing. Pt did taken his lisinopril this morning

## 2017-09-10 ENCOUNTER — Other Ambulatory Visit: Payer: Self-pay

## 2017-09-10 ENCOUNTER — Inpatient Hospital Stay (HOSPITAL_COMMUNITY): Payer: Medicaid Other

## 2017-09-10 ENCOUNTER — Encounter (HOSPITAL_COMMUNITY): Payer: Self-pay | Admitting: Family Medicine

## 2017-09-10 DIAGNOSIS — I10 Essential (primary) hypertension: Secondary | ICD-10-CM | POA: Diagnosis not present

## 2017-09-10 DIAGNOSIS — R55 Syncope and collapse: Secondary | ICD-10-CM | POA: Diagnosis present

## 2017-09-10 DIAGNOSIS — E871 Hypo-osmolality and hyponatremia: Secondary | ICD-10-CM | POA: Diagnosis present

## 2017-09-10 DIAGNOSIS — I959 Hypotension, unspecified: Secondary | ICD-10-CM

## 2017-09-10 DIAGNOSIS — E440 Moderate protein-calorie malnutrition: Secondary | ICD-10-CM | POA: Diagnosis present

## 2017-09-10 DIAGNOSIS — E861 Hypovolemia: Secondary | ICD-10-CM | POA: Diagnosis present

## 2017-09-10 DIAGNOSIS — N400 Enlarged prostate without lower urinary tract symptoms: Secondary | ICD-10-CM | POA: Diagnosis present

## 2017-09-10 DIAGNOSIS — Z79899 Other long term (current) drug therapy: Secondary | ICD-10-CM | POA: Diagnosis not present

## 2017-09-10 DIAGNOSIS — M542 Cervicalgia: Secondary | ICD-10-CM

## 2017-09-10 DIAGNOSIS — N179 Acute kidney failure, unspecified: Secondary | ICD-10-CM | POA: Diagnosis not present

## 2017-09-10 DIAGNOSIS — R42 Dizziness and giddiness: Secondary | ICD-10-CM | POA: Diagnosis present

## 2017-09-10 DIAGNOSIS — F17210 Nicotine dependence, cigarettes, uncomplicated: Secondary | ICD-10-CM | POA: Diagnosis present

## 2017-09-10 DIAGNOSIS — N183 Chronic kidney disease, stage 3 (moderate): Secondary | ICD-10-CM | POA: Diagnosis present

## 2017-09-10 DIAGNOSIS — D631 Anemia in chronic kidney disease: Secondary | ICD-10-CM | POA: Diagnosis present

## 2017-09-10 DIAGNOSIS — R079 Chest pain, unspecified: Secondary | ICD-10-CM | POA: Diagnosis present

## 2017-09-10 DIAGNOSIS — Z6820 Body mass index (BMI) 20.0-20.9, adult: Secondary | ICD-10-CM | POA: Diagnosis not present

## 2017-09-10 DIAGNOSIS — R51 Headache: Secondary | ICD-10-CM | POA: Diagnosis present

## 2017-09-10 DIAGNOSIS — I129 Hypertensive chronic kidney disease with stage 1 through stage 4 chronic kidney disease, or unspecified chronic kidney disease: Secondary | ICD-10-CM | POA: Diagnosis present

## 2017-09-10 LAB — VITAMIN B12: VITAMIN B 12: 274 pg/mL (ref 180–914)

## 2017-09-10 LAB — COMPREHENSIVE METABOLIC PANEL
ALT: 40 U/L (ref 17–63)
AST: 65 U/L — ABNORMAL HIGH (ref 15–41)
Albumin: 3.4 g/dL — ABNORMAL LOW (ref 3.5–5.0)
Alkaline Phosphatase: 70 U/L (ref 38–126)
Anion gap: 16 — ABNORMAL HIGH (ref 5–15)
BILIRUBIN TOTAL: 1.1 mg/dL (ref 0.3–1.2)
BUN: 15 mg/dL (ref 6–20)
CHLORIDE: 95 mmol/L — AB (ref 101–111)
CO2: 19 mmol/L — ABNORMAL LOW (ref 22–32)
Calcium: 8.3 mg/dL — ABNORMAL LOW (ref 8.9–10.3)
Creatinine, Ser: 2.58 mg/dL — ABNORMAL HIGH (ref 0.61–1.24)
GFR, EST AFRICAN AMERICAN: 29 mL/min — AB (ref >60)
GFR, EST NON AFRICAN AMERICAN: 25 mL/min — AB (ref >60)
Glucose, Bld: 82 mg/dL (ref 65–99)
POTASSIUM: 3.7 mmol/L (ref 3.5–5.1)
Sodium: 130 mmol/L — ABNORMAL LOW (ref 135–145)
TOTAL PROTEIN: 6.1 g/dL — AB (ref 6.5–8.1)

## 2017-09-10 LAB — TROPONIN I: Troponin I: <0.03 ng/mL (ref <0.03)

## 2017-09-10 LAB — HIV ANTIBODY (ROUTINE TESTING W REFLEX): HIV SCREEN 4TH GENERATION: NONREACTIVE

## 2017-09-10 LAB — CREATININE, URINE, RANDOM: CREATININE, URINE: 118.83 mg/dL

## 2017-09-10 LAB — SODIUM, URINE, RANDOM: SODIUM UR: 64 mmol/L

## 2017-09-10 MED ORDER — SODIUM CHLORIDE 0.9% FLUSH
3.0000 mL | Freq: Two times a day (BID) | INTRAVENOUS | Status: DC
  Administered 2017-09-10 – 2017-09-13 (×5): 3 mL via INTRAVENOUS

## 2017-09-10 MED ORDER — BISACODYL 5 MG PO TBEC
5.0000 mg | DELAYED_RELEASE_TABLET | Freq: Every day | ORAL | Status: DC | PRN
Start: 2017-09-10 — End: 2017-09-13

## 2017-09-10 MED ORDER — HYDROCODONE-ACETAMINOPHEN 5-325 MG PO TABS
1.0000 | ORAL_TABLET | ORAL | Status: DC | PRN
  Administered 2017-09-10: 1 via ORAL
  Administered 2017-09-10 – 2017-09-12 (×4): 2 via ORAL
  Administered 2017-09-12: 1 via ORAL
  Filled 2017-09-10: qty 2
  Filled 2017-09-10: qty 1
  Filled 2017-09-10: qty 2
  Filled 2017-09-10: qty 1
  Filled 2017-09-10 (×2): qty 2

## 2017-09-10 MED ORDER — POTASSIUM CHLORIDE IN NACL 20-0.9 MEQ/L-% IV SOLN
INTRAVENOUS | Status: AC
  Administered 2017-09-10: 06:00:00 via INTRAVENOUS
  Filled 2017-09-10 (×2): qty 1000

## 2017-09-10 MED ORDER — INFLUENZA VAC SPLIT QUAD 0.5 ML IM SUSY: 0.5000 mL | PREFILLED_SYRINGE | INTRAMUSCULAR | Status: DC

## 2017-09-10 MED ORDER — MORPHINE SULFATE (PF) 4 MG/ML IV SOLN: 4.0000 mg | INTRAVENOUS | Status: DC | PRN

## 2017-09-10 MED ORDER — SENNOSIDES-DOCUSATE SODIUM 8.6-50 MG PO TABS: 1.0000 | ORAL_TABLET | Freq: Every evening | ORAL | Status: DC | PRN

## 2017-09-10 MED ORDER — ACETAMINOPHEN 325 MG PO TABS
650.0000 mg | ORAL_TABLET | Freq: Four times a day (QID) | ORAL | Status: DC | PRN
  Administered 2017-09-13: 650 mg via ORAL
  Filled 2017-09-10: qty 2

## 2017-09-10 MED ORDER — SODIUM CHLORIDE 0.9 % IV BOLUS (SEPSIS)
1000.0000 mL | Freq: Once | INTRAVENOUS | Status: AC
  Administered 2017-09-10: 1000 mL via INTRAVENOUS

## 2017-09-10 MED ORDER — ACETAMINOPHEN 650 MG RE SUPP: 650.0000 mg | Freq: Four times a day (QID) | RECTAL | Status: DC | PRN

## 2017-09-10 MED ORDER — ONDANSETRON HCL 4 MG/2ML IJ SOLN: 4.0000 mg | Freq: Four times a day (QID) | INTRAMUSCULAR | Status: DC | PRN

## 2017-09-10 MED ORDER — HEPARIN SODIUM (PORCINE) 5000 UNIT/ML IJ SOLN
5000.0000 [IU] | Freq: Three times a day (TID) | INTRAMUSCULAR | Status: DC
  Administered 2017-09-10 – 2017-09-13 (×7): 5000 [IU] via SUBCUTANEOUS
  Filled 2017-09-10 (×8): qty 1

## 2017-09-10 MED ORDER — ENSURE ENLIVE PO LIQD
237.0000 mL | Freq: Two times a day (BID) | ORAL | Status: DC
  Administered 2017-09-11 – 2017-09-13 (×5): 237 mL via ORAL

## 2017-09-10 MED ORDER — TRAZODONE HCL 50 MG PO TABS
150.0000 mg | ORAL_TABLET | Freq: Every day | ORAL | Status: DC
  Administered 2017-09-10: 150 mg via ORAL
  Administered 2017-09-11 – 2017-09-12 (×2): 300 mg via ORAL
  Filled 2017-09-10: qty 6
  Filled 2017-09-10: qty 3
  Filled 2017-09-10: qty 6

## 2017-09-10 MED ORDER — ONDANSETRON HCL 4 MG PO TABS: 4.0000 mg | ORAL_TABLET | Freq: Four times a day (QID) | ORAL | Status: DC | PRN

## 2017-09-10 NOTE — ED Notes (Signed)
Pt. To XRAY via stretcher. 

## 2017-09-10 NOTE — Progress Notes (Addendum)
Pt admitted after midnight. For details please refer to admission note done 09/10/2017. Pt admitted with lightheadedness and near passing out. Work up in progress.  Manson Passeylma Shaila Gilchrest [facility] [phone]

## 2017-09-10 NOTE — H&P (Signed)
History and Physical    Larry PhyWilliam Bevacqua NWG:[id number] DOB: [birth year] DOA: 09/09/2017  PCP: System, Pcp Not In   Patient coming from: Home  Chief Complaint: Neck pain, lightheaded on standing, chest pressure   HPI: Larry Byrd is a 62 y.o. male with medical history significant for hypertension, now presenting to the emergency department for evaluation of lightheadedness on standing, intermittent chest pressure, and neck pain.  Patient reports that he has developed a nonspecific malaise over the past week with poor appetite and progressive lightheadedness upon standing.  He also reports some intermittent chest pressure, sometimes with exertion, sometimes at rest.  He also describes pain at the base of his neck posteriorly, worse with certain movements, similar to pain he has experienced previously but had not had for a while.  He denies any recent fevers or chills, denies abdominal pain, nausea, vomiting, or diarrhea.  He continues to be adherent with his blood pressure medications.  ED Course: Upon arrival to the ED, patient is found to be afebrile, saturating well on room air, blood pressure 88/62, and vitals otherwise normal.  EKG features a sinus rhythm with possible sinus arrhythmia.  Chest x-ray is notable for hyperinflation.  Chemistry panel reveals a sodium of 129 and creatinine of 3.06, up from 1.93 in December and 1.34 last October.  CBC is notable for a new macrocytosis without anemia.  Troponin is within the normal limits.  Patient was given a liter of normal saline in the ED, blood pressure improved, he remains hemodynamically stable and in no apparent respiratory distress.  He will be admitted to the telemetry unit for ongoing evaluation and management of acute kidney injury and intermittent chest pressure.  Review of Systems:  All other systems reviewed and apart from HPI, are negative.  Past Medical History:  Diagnosis Date  . Diverticulosis   . Enlarged prostate   .  Hypertension     History reviewed. No pertinent surgical history.   reports that he has been smoking cigarettes.  he has never used smokeless tobacco. He reports that he drinks alcohol. He reports that he does not use drugs.  No Known Allergies  History reviewed. No pertinent family history.   Prior to Admission medications   Medication Sig Start Date End Date Taking? Authorizing Provider  benzonatate (TESSALON) 100 MG capsule Take 1 capsule (100 mg total) by mouth every 8 (eight) hours. 08/11/17   Elson AreasSofia, Leslie K, PA-C  lisinopril-hydrochlorothiazide (PRINZIDE,ZESTORETIC) 20-12.5 MG tablet Take 1 tablet by mouth daily. 08/11/17   Elson AreasSofia, Leslie K, PA-C  potassium chloride SA (K-DUR,KLOR-CON) 20 MEQ tablet Take 2 tablets (40 mEq total) by mouth daily. 08/11/17   Elson AreasSofia, Leslie K, PA-C  traZODone (DESYREL) 150 MG tablet Take 150-300 mg by mouth at bedtime. 03/11/17   [provider]    Physical Exam: Vitals:   09/09/17 2233 09/10/17 0159 09/10/17 0300  BP: (!) 88/62  103/60  Pulse: 73  70  Resp: 16  20  Temp: 98 F (36.7 C)    TempSrc: Oral    SpO2: 100%  99%  Weight:  68 kg (150 lb)   Height:  5' 10.5" (1.791 m)       Constitutional: NAD, calm, very thin Eyes: PERTLA, lids and conjunctivae normal ENMT: Mucous membranes are moist. Posterior pharynx clear of any exudate or lesions.   Neck: normal, supple, no masses, no thyromegaly Respiratory: Slightly diminished breath sounds bilaterally, no wheezing, no crackles. Normal respiratory effort.   Cardiovascular: S1 & S2  heard, regular rate and rhythm. No extremity edema. No significant JVD. Abdomen: No distension, no tenderness, no masses palpated. Bowel sounds normal.  Musculoskeletal: no clubbing / cyanosis. No joint deformity upper and lower extremities.   Skin: no significant rashes, lesions, ulcers. Poor turgor. Neurologic: CN 2-12 grossly intact. Sensation intact, DTR normal. Strength 5/5 in all 4 limbs.    Psychiatric: Alert and oriented x 3. Calm, cooperative.     Labs on Admission: I have personally reviewed following labs and imaging studies  CBC: Recent Labs  Lab 09/09/17 2238  WBC 5.1  HGB 13.2  HCT 39.2  MCV 101.0*  PLT 222   Basic Metabolic Panel: Recent Labs  Lab 09/09/17 2238  NA 129*  K 3.6  CL 91*  CO2 22  GLUCOSE 107*  BUN 15  CREATININE 3.06*  CALCIUM 9.2   GFR: Estimated Creatinine Clearance: 23.8 mL/min (A) (by C-G formula based on SCr of 3.06 mg/dL (H)). Liver Function Tests: No results for input(s): AST, ALT, ALKPHOS, BILITOT, PROT, ALBUMIN in the last 168 hours. No results for input(s): LIPASE, AMYLASE in the last 168 hours. No results for input(s): AMMONIA in the last 168 hours. Coagulation Profile: No results for input(s): INR, PROTIME in the last 168 hours. Cardiac Enzymes: No results for input(s): CKTOTAL, CKMB, CKMBINDEX, TROPONINI in the last 168 hours. BNP (last 3 results) No results for input(s): PROBNP in the last 8760 hours. HbA1C: No results for input(s): HGBA1C in the last 72 hours. CBG: No results for input(s): GLUCAP in the last 168 hours. Lipid Profile: No results for input(s): CHOL, HDL, LDLCALC, TRIG, CHOLHDL, LDLDIRECT in the last 72 hours. Thyroid Function Tests: No results for input(s): TSH, T4TOTAL, FREET4, T3FREE, THYROIDAB in the last 72 hours. Anemia Panel: No results for input(s): VITAMINB12, FOLATE, FERRITIN, TIBC, IRON, RETICCTPCT in the last 72 hours. Urine analysis:    Component Value Date/Time   COLORURINE YELLOW 07/12/2017 2100   APPEARANCEUR HAZY (A) 07/12/2017 2100   LABSPEC 1.016 07/12/2017 2100   PHURINE 5.0 07/12/2017 2100   GLUCOSEU 150 (A) 07/12/2017 2100   HGBUR MODERATE (A) 07/12/2017 2100   BILIRUBINUR NEGATIVE 07/12/2017 2100   KETONESUR NEGATIVE 07/12/2017 2100   PROTEINUR 30 (A) 07/12/2017 2100   NITRITE NEGATIVE 07/12/2017 2100   LEUKOCYTESUR NEGATIVE 07/12/2017 2100   Sepsis  Labs: @LABRCNTIP (procalcitonin:4,lacticidven:4) )No results found for this or any previous visit (from the past 240 hour(s)).   Radiological Exams on Admission: Dg Chest 2 View  Result Date: 09/09/2017 CLINICAL DATA:  Chest, neck and back pain this evening. EXAM: CHEST  2 VIEW COMPARISON:  None. FINDINGS: The heart size and mediastinal contours are within normal limits. Hyperinflated appearance of the lungs without pneumonic consolidation or CHF. No effusion or pneumothorax. The visualized skeletal structures are unremarkable. IMPRESSION: Hyperinflated lungs. Electronically Signed   By: Tollie Eth M.D.   On: 09/09/2017 23:15    EKG: Independently reviewed. Sinus rhythm, possible sinus arrhythmia.   Assessment/Plan  1. Acute kidney injury  - SCr is 3.06 on admission, up from 1.93 in December 2018 and 1.34 in October 2018  - He is hypovolemic on admission, continues to take lisinopril-HCTZ - Likely prerenal azotemia, will check renal US and urine studies  - Continue fluid-resuscitation with NS, renally-dose medications, avoid nephrotoxins, repeat chem panel in am    2. Chest pain - Reported chest pressure to ED physician, did not mention on admission interview until specifically questioned  - Vague transient chest pressure, sometimes with  exertion  - No known CAD, initial troponin wnl, no acute ischemic features appreciated on EKG, CXR notable for hyperinflation only  - Continue cardiac monitoring, obtain serial troponin measurements, hold ACE in light of AKI   3. Neck pain  - Reports pain at base of neck, worse with movements  - He used to have similar pain frequently, but not in a while  - Exam is unremarkable, will check radiographs   4. Hyponatremia  - Serum sodium is 129 on admission in setting of hypovolemia and HCTZ-use  - Hold HCTZ, continue fluid-resuscitation with NS, repeat chem panel in am     5. Hypotension; hx of HTN  - BP 88/62 on arrival to ED  - Suspected  secondary to hypovolemia, continued use of his antihypertensives  - Improved with 1 liter NS in ED  - Hold lisinopril-HCTZ, continue IVF     DVT prophylaxis: sq heparin  Code Status: Full  Family Communication: Significant other updated at bedside Disposition Plan: Admit to telemetry Consults called: None Admission status: Inpatient   Briscoe Deutscher, MD [facility] Pager [phone]  If 7PM-7AM, please contact night-coverage [email or web address] Password [facility]  09/10/2017, 4:26 AM

## 2017-09-10 NOTE — ED Notes (Signed)
Meal tray delivered.

## 2017-09-10 NOTE — ED Provider Notes (Signed)
[facility] EMERGENCY DEPARTMENT Provider Note   CSN: [id number] Arrival date & time: 09/09/17  2222     History   Chief Complaint Chief Complaint  Patient presents with  . Headache  . Chest Pain    HPI Larry Byrd is a 62 y.o. male.  Patient presents to the ER for evaluation of headache, neck pain and chest pain.  Symptoms began earlier this morning.  He reports that he feels a heaviness and weight on his neck and on his chest.  He tells me that he normally has been very active but recently has noticed he has not been able to be as active as he would like.  He has noticed that he gets pain in his chest when he walks and has to sit down.  Today he has not been able to get up and move around at all because of the pain.  He feels short of breath with exertion.      Past Medical History:  Diagnosis Date  . Diverticulosis   . Enlarged prostate   . Hypertension     There are no active problems to display for this patient.   History reviewed. No pertinent surgical history.     Home Medications    Prior to Admission medications   Medication Sig Start Date End Date Taking? Authorizing Provider  benzonatate (TESSALON) 100 MG capsule Take 1 capsule (100 mg total) by mouth every 8 (eight) hours. 08/11/17   Elson Areas, PA-C  lisinopril-hydrochlorothiazide (PRINZIDE,ZESTORETIC) 20-12.5 MG tablet Take 1 tablet by mouth daily. 08/11/17   Elson Areas, PA-C  potassium chloride SA (K-DUR,KLOR-CON) 20 MEQ tablet Take 2 tablets (40 mEq total) by mouth daily. 08/11/17   Elson Areas, PA-C  traZODone (DESYREL) 150 MG tablet Take 150-300 mg by mouth at bedtime. 03/11/17   [provider]    Family History No family history on file.  Social History Social History   Tobacco Use  . Smoking status: Current Every Day Smoker    Types: Cigarettes  . Smokeless tobacco: Never Used  Substance Use Topics  . Alcohol use: Yes  . Drug use: No      Allergies   Patient has no known allergies.   Review of Systems Review of Systems  Respiratory: Positive for shortness of breath.   Cardiovascular: Positive for chest pain.  Musculoskeletal: Positive for neck pain.  All other systems reviewed and are negative.    Physical Exam Updated Vital Signs BP (!) 88/62   Pulse 73   Temp 98 F (36.7 C) (Oral)   Resp 16   Ht 5' 10.5" (1.791 m)   Wt 68 kg (150 lb)   SpO2 100%   BMI 21.22 kg/m   Physical Exam  Constitutional: He is oriented to person, place, and time. He appears well-developed and well-nourished. No distress.  HENT:  Head: Normocephalic and atraumatic.  Right Ear: Hearing normal.  Left Ear: Hearing normal.  Nose: Nose normal.  Mouth/Throat: Oropharynx is clear and moist and mucous membranes are normal.  Eyes: Conjunctivae and EOM are normal. Pupils are equal, round, and reactive to light.  Neck: Normal range of motion. Neck supple.  Cardiovascular: Regular rhythm, S1 normal and S2 normal. Exam reveals no gallop and no friction rub.  No murmur heard. Pulmonary/Chest: Effort normal and breath sounds normal. No respiratory distress. He exhibits no tenderness.  Abdominal: Soft. Normal appearance and bowel sounds are normal. There is no hepatosplenomegaly. There is no  tenderness. There is no rebound, no guarding, no tenderness at McBurney's point and negative Murphy's sign. No hernia.  Musculoskeletal: Normal range of motion.  Neurological: He is alert and oriented to person, place, and time. He has normal strength. No cranial nerve deficit or sensory deficit. Coordination normal. GCS eye subscore is 4. GCS verbal subscore is 5. GCS motor subscore is 6.  Skin: Skin is warm, dry and intact. No rash noted. No cyanosis.  Psychiatric: He has a normal mood and affect. His speech is normal and behavior is normal. Thought content normal.  Nursing note and vitals reviewed.    ED Treatments / Results  Labs (all labs  ordered are listed, but only abnormal results are displayed) Labs Reviewed  BASIC METABOLIC PANEL - Abnormal; Notable for the following components:      Result Value   Sodium 129 (*)    Chloride 91 (*)    Glucose, Bld 107 (*)    Creatinine, Ser 3.06 (*)    GFR calc non Af Amer 20 (*)    GFR calc Af Amer 23 (*)    Anion gap 16 (*)    All other components within normal limits  CBC - Abnormal; Notable for the following components:   RBC 3.88 (*)    MCV 101.0 (*)    All other components within normal limits  I-STAT TROPONIN, ED    EKG  EKG Interpretation  Date/Time:  Friday September 09 2017 22:34:27 EST Ventricular Rate:  73 PR Interval:  142 QRS Duration: 90 QT Interval:  384 QTC Calculation: 423 R Axis:   87 Text Interpretation:  Normal sinus rhythm with sinus arrhythmia Right atrial enlargement Borderline ECG Confirmed by Gilda CreasePollina, Christopher J [phone]([id number]) on 09/10/2017 12:24:05 AM       Radiology Dg Chest 2 View  Result Date: 09/09/2017 CLINICAL DATA:  Chest, neck and back pain this evening. EXAM: CHEST  2 VIEW COMPARISON:  None. FINDINGS: The heart size and mediastinal contours are within normal limits. Hyperinflated appearance of the lungs without pneumonic consolidation or CHF. No effusion or pneumothorax. The visualized skeletal structures are unremarkable. IMPRESSION: Hyperinflated lungs. Electronically Signed   By: Tollie Ethavid  Kwon M.D.   On: 09/09/2017 23:15    Procedures Procedures (including critical care time)  Medications Ordered in ED Medications  sodium chloride 0.9 % bolus 1,000 mL (0 mLs Intravenous Stopped 09/10/17 0238)     Initial Impression / Assessment and Plan / ED Course  I have reviewed the triage vital signs and the nursing notes.  Pertinent labs & imaging results that were available during my care of the patient were reviewed by me and considered in my medical decision making (see chart for details).     Patient presents to the emergency  department for evaluation of neck pain, back pain, chest pain.  Patient reports that he has been noticing decreased exercise tolerance and exertional chest pain for the last several weeks or months.  Tonight he has not been feeling well all day, experiencing a headache as well as a pressure, "like someone standing on my chest".  This is concerning for progressive cardiac etiology.  He does not have any known cardiac disease.  Patient also found to be hypotensive at arrival.  He reports that he has not been eating or drinking, but has not had vomiting or diarrhea.  He is likely dehydrated because he has significant acute kidney injury noted.  Patient administered IV fluid hydration for his hypotension and dehydration.  He will require hospitalization for further management.  Final Clinical Impressions(s) / ED Diagnoses   Final diagnoses:  Bad headache  Chest pain, unspecified type  AKI (acute kidney injury) [facility])    ED Discharge Orders    None       Gilda Crease, MD 09/10/17 [phone]

## 2017-09-11 LAB — BASIC METABOLIC PANEL
ANION GAP: 10 (ref 5–15)
BUN: 8 mg/dL (ref 6–20)
CHLORIDE: 99 mmol/L — AB (ref 101–111)
CO2: 26 mmol/L (ref 22–32)
Calcium: 8.7 mg/dL — ABNORMAL LOW (ref 8.9–10.3)
Creatinine, Ser: 1.23 mg/dL (ref 0.61–1.24)
GFR calc non Af Amer: >60 mL/min (ref >60)
Glucose, Bld: 93 mg/dL (ref 65–99)
POTASSIUM: 4.1 mmol/L (ref 3.5–5.1)
Sodium: 135 mmol/L (ref 135–145)

## 2017-09-11 LAB — CBC
HCT: 32.6 % — ABNORMAL LOW (ref 39.0–52.0)
Hemoglobin: 10.8 g/dL — ABNORMAL LOW (ref 13.0–17.0)
MCH: 33.8 pg (ref 26.0–34.0)
MCHC: 33.1 g/dL (ref 30.0–36.0)
MCV: 101.9 fL — AB (ref 78.0–100.0)
Platelets: 172 10*3/uL (ref 150–400)
RBC: 3.2 MIL/uL — AB (ref 4.22–5.81)
RDW: 14.6 % (ref 11.5–15.5)
WBC: 4.3 10*3/uL (ref 4.0–10.5)

## 2017-09-11 LAB — UREA NITROGEN, URINE: Urea Nitrogen, Ur: 234 mg/dL

## 2017-09-11 NOTE — Plan of Care (Signed)
  Education: Knowledge of General Education information will improve 09/11/2017 0018 - Progressing by Elnita Maxwellodoo, Zaeem Kandel A, RN 09/11/2017 0017 - Progressing by Elnita Maxwellodoo, Anevay Campanella A, RN   Activity: Risk for activity intolerance will decrease 09/11/2017 0018 - Progressing by Elnita Maxwellodoo, Blayke Pinera A, RN 09/11/2017 0017 - Progressing by Elnita Maxwellodoo, Eilleen Davoli A, RN   Coping: Level of anxiety will decrease 09/11/2017 0018 - Progressing by Elnita Maxwellodoo, Seba Madole A, RN   Pain Managment: General experience of comfort will improve 09/11/2017 0018 - Progressing by Elnita Maxwellodoo, Fatimah Sundquist A, RN   Skin Integrity: Risk for impaired skin integrity will decrease 09/11/2017 0017 - Progressing by Elnita Maxwellodoo, Donnie Gedeon A, RN

## 2017-09-11 NOTE — Plan of Care (Signed)
  Health Behavior/Discharge Planning: Ability to manage health-related needs will improve 09/11/2017 2104 - Progressing by Satoshi Kalas A, RN   Clinical Measurements: Ability to maintain clinical measurements within normal limits will improve 09/11/2017 2104 - Progressing by Sheryle Hailolumbres, Orpha Dain A, RN Diagnostic test results will improve 09/11/2017 2104 - Progressing by Jessamyn Watterson A, RN   Nutrition: Adequate nutrition will be maintained 09/11/2017 2104 - Progressing by Kirtan Sada, Marlana Salvageonnie A, RN

## 2017-09-11 NOTE — Progress Notes (Signed)
Orthostatic vitals completed per order. Please see flowsheet.

## 2017-09-11 NOTE — Progress Notes (Addendum)
Patient ID: Larry Byrd, male   DOB: 1955/12/14, 62 y.o.   MRN: [id number]  PROGRESS NOTE    Larry Byrd  QMV:[id number] DOB: Dec 04, 1955 DOA: 09/09/2017  PCP: System, Pcp Not In   Brief Narrative:   62 y.o. male with medical history significant for hypertension who presented to ED with lightheadedness and almost passing out, intermittent chest pressure and neck pain for past 24 hours prior to this admission. Patient did have nonspecific malaise over past week and poor appetite. No fevers or chills or cough. No abdominal pain, nausea or vomiting. No loss of consciousness. In ED, blood pressure was 88/62, 12-lead EKG showed sinus rhythm with possible sinus arrhythmia. Chest x-ray showed hyperinflation. Sodium was 129 and creatinine was as high as 3.06, up from baseline of 1.9 recently. He was admitted for evaluation of acute on chronic kidney disease.  Assessment & Plan:   Principal Problem:   Chest pain - ACS ruled out, the 3 sets of cardiac enzymes are negative - No reports of chest pain this morning - 12-lead EKG showed no acute ischemic changes - No acute findings on CXR or neck x ray  Active Problems:   Acute renal failure superimposed on chronic kidney disease stage III - Recent creatinine baseline 1.9 and creatinine as high as 3.06 on this admission - Hold Prinzide - Creatinine is improving, 2.58 this morning - Follow-up BMP tomorrow morning      Hyponatremia - Sodium 130 this morning    Anemia of chronic kidney disease - Hemoglobin stable     DVT prophylaxis: Heparin subQ Code Status: full code  Family Communication: family at bedside Disposition Plan: home once Cr closer to baseline value of 1.9    Consultants:   None  Procedures:   None   Antimicrobials:   None    Subjective: No overnight events.    Objective: Vitals:   09/10/17 1659 09/10/17 1706 09/10/17 2115 09/11/17 0523  BP: 117/63  118/63 106/63  Pulse: 69  65 70  Resp: 18  20 20     Temp: 98.3 F (36.8 C)  98.4 F (36.9 C) 98.4 F (36.9 C)  TempSrc: Oral  Oral Oral  SpO2: 100%  100% 100%  Weight:  64 kg (141 lb)  63.3 kg (139 lb 8 oz)  Height: 5\' 10"  (1.778 m)       Intake/Output Summary (Last 24 hours) at 09/11/2017 0913 Last data filed at 09/11/2017 0824 Gross per 24 hour  Intake 1360 ml  Output 700 ml  Net 660 ml   Filed Weights   09/10/17 0159 09/10/17 1706 09/11/17 0523  Weight: 68 kg (150 lb) 64 kg (141 lb) 63.3 kg (139 lb 8 oz)    Examination:  General exam: Appears calm and comfortable  Respiratory system: Clear to auscultation. Respiratory effort normal. Cardiovascular system: S1 & S2 heard, RRR Gastrointestinal system: Abdomen is nondistended, soft and nontender. No organomegaly or masses felt. Normal bowel sounds heard. Central nervous system: Alert and oriented. No focal neurological deficits. Extremities: Symmetric 5 x 5 power. Skin: No rashes, lesions or ulcers Psychiatry: Judgement and insight appear normal. Mood & affect appropriate.   Data Reviewed: I have personally reviewed following labs and imaging studies  CBC: Recent Labs  Lab 09/09/17 2238 09/11/17 0811  WBC 5.1 4.3  HGB 13.2 10.8*  HCT 39.2 32.6*  MCV 101.0* 101.9*  PLT 222 172   Basic Metabolic Panel: Recent Labs  Lab 09/09/17 2238 09/10/17 0449  NA 129* 130*  K 3.6 3.7  CL 91* 95*  CO2 22 19*  GLUCOSE 107* 82  BUN 15 15  CREATININE 3.06* 2.58*  CALCIUM 9.2 8.3*   GFR: Estimated Creatinine Clearance: 26.2 mL/min (A) (by C-G formula based on SCr of 2.58 mg/dL (H)). Liver Function Tests: Recent Labs  Lab 09/10/17 0449  AST 65*  ALT 40  ALKPHOS 70  BILITOT 1.1  PROT 6.1*  ALBUMIN 3.4*   No results for input(s): LIPASE, AMYLASE in the last 168 hours. No results for input(s): AMMONIA in the last 168 hours. Coagulation Profile: No results for input(s): INR, PROTIME in the last 168 hours. Cardiac Enzymes: Recent Labs  Lab 09/10/17 0449  09/10/17 1114 09/10/17 1804  TROPONINI <0.03 <0.03 <0.03   BNP (last 3 results) No results for input(s): PROBNP in the last 8760 hours. HbA1C: No results for input(s): HGBA1C in the last 72 hours. CBG: No results for input(s): GLUCAP in the last 168 hours. Lipid Profile: No results for input(s): CHOL, HDL, LDLCALC, TRIG, CHOLHDL, LDLDIRECT in the last 72 hours. Thyroid Function Tests: No results for input(s): TSH, T4TOTAL, FREET4, T3FREE, THYROIDAB in the last 72 hours. Anemia Panel: Recent Labs    09/10/17 0449  VITAMINB12 274   Urine analysis:    Component Value Date/Time   COLORURINE YELLOW 07/12/2017 2100   APPEARANCEUR HAZY (A) 07/12/2017 2100   LABSPEC 1.016 07/12/2017 2100   PHURINE 5.0 07/12/2017 2100   GLUCOSEU 150 (A) 07/12/2017 2100   HGBUR MODERATE (A) 07/12/2017 2100   BILIRUBINUR NEGATIVE 07/12/2017 2100   KETONESUR NEGATIVE 07/12/2017 2100   PROTEINUR 30 (A) 07/12/2017 2100   NITRITE NEGATIVE 07/12/2017 2100   LEUKOCYTESUR NEGATIVE 07/12/2017 2100   Sepsis Labs: @LABRCNTIP (procalcitonin:4,lacticidven:4)   )No results found for this or any previous visit (from the past 240 hour(s)).    Radiology Studies: Dg Neck Soft Tissue  Result Date: 09/10/2017 CLINICAL DATA:  Neck pain. EXAM: NECK SOFT TISSUES - 1+ VIEW COMPARISON:  None. FINDINGS: There is no evidence of retropharyngeal soft tissue swelling or epiglottic enlargement. The cervical airway is unremarkable. No radio-opaque foreign body identified. Soft tissue planes are non suspicious. Multilevel degenerative disc disease in the cervical spine. C5-C6 through the cervicothoracic junction are obscured by overlapping osseous and soft tissue. IMPRESSION: 1. Unremarkable radiographic appearance of the soft tissues of the neck. 2. Diffuse degenerative disc disease of the cervical spine. Electronically Signed   By: Rubye OaksMelanie  Ehinger M.D.   On: 09/10/2017 06:18   Dg Chest 2 View  Result Date:  09/09/2017 CLINICAL DATA:  Chest, neck and back pain this evening. EXAM: CHEST  2 VIEW COMPARISON:  None. FINDINGS: The heart size and mediastinal contours are within normal limits. Hyperinflated appearance of the lungs without pneumonic consolidation or CHF. No effusion or pneumothorax. The visualized skeletal structures are unremarkable. IMPRESSION: Hyperinflated lungs. Electronically Signed   By: Tollie Ethavid  Kwon M.D.   On: 09/09/2017 23:15   [place] Renal  Result Date: 09/10/2017 CLINICAL DATA:  Acute kidney injury. EXAM: RENAL / URINARY TRACT ULTRASOUND COMPLETE COMPARISON:  CT 05/02/2017 FINDINGS: Right Kidney: Length: 10.1 cm. Echogenicity within normal limits. No mass or hydronephrosis visualized. Left Kidney: Length: 10 cm. Echogenicity within normal limits. Nonobstructing 4 mm stone in the lower kidney. No mass or hydronephrosis visualized. Bladder: Appears normal for degree of bladder distention. IMPRESSION: Nonobstructing left renal stone.  No hydronephrosis. Electronically Signed   By: Rubye OaksMelanie  Ehinger M.D.   On: 09/10/2017 05:56  Scheduled Meds: . feeding supplement (ENSURE ENLIVE)  237 mL Oral BID BM  . heparin  5,000 Units Subcutaneous Q8H  . traZODone  150-300 mg Oral QHS   Continuous Infusions:   LOS: 1 day    Time spent: 25 minutes  Greater than 50% of the time spent on counseling and coordinating the care.   Manson Passey, MD [facility] Pager [phone]  If 7PM-7AM, please contact night-coverage [email or web address] Password [facility] 09/11/2017, 9:13 AM

## 2017-09-12 LAB — CBC
HCT: 30.1 % — ABNORMAL LOW (ref 39.0–52.0)
HEMOGLOBIN: 10 g/dL — AB (ref 13.0–17.0)
MCH: 34.6 pg — ABNORMAL HIGH (ref 26.0–34.0)
MCHC: 33.2 g/dL (ref 30.0–36.0)
MCV: 104.2 fL — ABNORMAL HIGH (ref 78.0–100.0)
PLATELETS: 163 10*3/uL (ref 150–400)
RBC: 2.89 MIL/uL — ABNORMAL LOW (ref 4.22–5.81)
RDW: 14.9 % (ref 11.5–15.5)
WBC: 3.9 10*3/uL — ABNORMAL LOW (ref 4.0–10.5)

## 2017-09-12 LAB — BASIC METABOLIC PANEL
Anion gap: 10 (ref 5–15)
BUN: 12 mg/dL (ref 6–20)
CO2: 27 mmol/L (ref 22–32)
Calcium: 9.7 mg/dL (ref 8.9–10.3)
Chloride: 100 mmol/L — ABNORMAL LOW (ref 101–111)
Creatinine, Ser: 1.33 mg/dL — ABNORMAL HIGH (ref 0.61–1.24)
GFR calc Af Amer: >60 mL/min (ref >60)
GFR calc non Af Amer: 55 mL/min — ABNORMAL LOW (ref >60)
Glucose, Bld: 92 mg/dL (ref 65–99)
Potassium: 4.3 mmol/L (ref 3.5–5.1)
Sodium: 137 mmol/L (ref 135–145)

## 2017-09-12 LAB — FOLATE RBC
FOLATE, HEMOLYSATE: 367.6 ng/mL
FOLATE, RBC: 1075 ng/mL (ref >498)
HEMATOCRIT: 34.2 % — AB (ref 37.5–51.0)

## 2017-09-12 MED ORDER — ADULT MULTIVITAMIN W/MINERALS CH
1.0000 | ORAL_TABLET | Freq: Every day | ORAL | Status: DC
  Administered 2017-09-13: 1 via ORAL
  Filled 2017-09-12 (×2): qty 1

## 2017-09-12 NOTE — Progress Notes (Signed)
Initial Nutrition Assessment  DOCUMENTATION CODES:   Non-severe (moderate) malnutrition in context of social or environmental circumstances  INTERVENTION:   -Continue Ensure Enlive po BID, each supplement provides 350 kcal and 20 grams of protein -MVI daily  NUTRITION DIAGNOSIS:   Moderate Malnutrition related to social / environmental circumstances as evidenced by mild fat depletion, moderate fat depletion, mild muscle depletion, moderate muscle depletion, energy intake < 75% for > or equal to 1 month.  GOAL:   Patient will meet greater than or equal to 90% of their needs  MONITOR:   PO intake, Supplement acceptance, Labs, Weight trends, Skin, I & O's  REASON FOR ASSESSMENT:   Malnutrition Screening Tool    ASSESSMENT:   Larry Byrd is a 62 y.o. male with medical history significant for hypertension, now presenting to the emergency department for evaluation of lightheadedness on standing, intermittent chest pressure, and neck pain.   Pt admitted with AKI.   Spoke with pt at bedside, who reports concern over weight loss and "not eating right". Per pt, he has had a poor appetite over the past several months. He has gotten very concerned about ongoing wt loss and change in appearance ("I have no muscles"). Pt reports UBW is around 175#, but unable to provide details about time frame for weight loss and documented wt hx is limited.   Pt shares appetite has improved greatly since being admitted to the hospital. Pt consumed about 75% of lunch tray and also consumed an Ensure supplement earlier today. Pt estimates he consumes 1-2 times per day. He shares with this RD multiple stressors in his life including lack of family support, food/job/housing insecurity, and ongoing wt loss. Noted pt with multiple shopping bags of miscellaneous food items in room (things like peanut butter, seasonings, and candy).   Discussed importance of good meal and supplement intake to promote healing.  Pt amenable to continue Ensure supplements while in the hospital and intends to continue supplements once his living situation becomes more stable.  Labs reviewed.   NUTRITION - FOCUSED PHYSICAL EXAM:    Most Recent Value  Orbital Region  Mild depletion  Upper Arm Region  Mild depletion  Thoracic and Lumbar Region  Mild depletion  Buccal Region  Mild depletion  Temple Region  Mild depletion  Clavicle Bone Region  Moderate depletion  Clavicle and Acromion Bone Region  Mild depletion  Scapular Bone Region  Moderate depletion  Dorsal Hand  No depletion  Patellar Region  Moderate depletion  Anterior Thigh Region  Moderate depletion  Posterior Calf Region  Moderate depletion  Edema (RD Assessment)  None  Hair  Reviewed  Eyes  Reviewed  Mouth  Reviewed  Skin  Reviewed  Nails  Reviewed       Diet Order:  Diet regular Room service appropriate? Yes; Fluid consistency: Thin  EDUCATION NEEDS:   Education needs have been addressed  Skin:  Skin Assessment: Reviewed RN Assessment  Last BM:  09/10/17  Height:   Ht Readings from Last 1 Encounters:  09/10/17 5\' 10"  (1.778 m)    Weight:   Wt Readings from Last 1 Encounters:  09/12/17 143 lb 3.2 oz (65 kg)    Ideal Body Weight:  75.5 kg  BMI:  Body mass index is 20.55 kg/m.  Estimated Nutritional Needs:   Kcal:  1950-2150  Protein:  95-110 grams  Fluid:  1.9-2.1 L    Larry Byrd, RD, LDN, CDE Pager: [phone] After hours Pager: [phone]

## 2017-09-12 NOTE — Progress Notes (Signed)
Patient ID: Larry Byrd, male   DOB: 24-Jan-1956, 62 y.o.   MRN: [id number]  PROGRESS NOTE    Larry Byrd  WUJ:[id number] DOB: [birth year] DOA: 09/09/2017  PCP: System, Pcp Not In   Brief Narrative:   62 y.o. male with medical history significant for hypertension who presented to ED with lightheadedness and almost passing out, intermittent chest pressure and neck pain for past 24 hours prior to this admission. Patient did have nonspecific malaise over past week and poor appetite. No fevers or chills or cough. No abdominal pain, nausea or vomiting. No loss of consciousness. In ED, blood pressure was 88/62, 12-lead EKG showed sinus rhythm with possible sinus arrhythmia. Chest x-ray showed hyperinflation. Sodium was 129 and creatinine was as high as 3.06, up from baseline of 1.9 recently. He was admitted for evaluation of acute on chronic kidney disease.  Assessment & Plan:   Principal Problem:   Chest pain / Lightheadedness  - ACS ruled out, the 3 sets of cardiac enzymes are negative - 12-lead EKG showed no acute ischemic changes - No acute findings on CXR or neck x ray - No chest pain this am - Obtain PT eval since pt reports he is still feeling little lightheaded   Active Problems:   Acute renal failure superimposed on chronic kidney disease stage III - Recent creatinine baseline 1.9 and creatinine as high as 3.06 on this admission - Held Prinizide - Cr now WNL      Hyponatremia - Sodium improved with hydration - Sodium WNL this am     Anemia of chronic kidney disease - Hgb stable      DVT prophylaxis: Heparin subQ Code Status: full code  Family Communication: family at bedside  Disposition Plan: needs PT eval, reports feeling lightheaded and not ready for discharge    Consultants:   PT  Procedures:   None   Antimicrobials:   None    Subjective: Feels lightheaded.    Objective: Vitals:   09/11/17 1128 09/11/17 1131 09/11/17 2158 09/12/17 0507  BP:    117/73 108/63  Pulse: 70 78 62 61  Resp:   16   Temp:   98.8 F (37.1 C) 98.3 F (36.8 C)  TempSrc:   Oral Oral  SpO2:   100% 100%  Weight:    65 kg (143 lb 3.2 oz)  Height:        Intake/Output Summary (Last 24 hours) at 09/12/2017 0938 Last data filed at 09/12/2017 0507 Gross per 24 hour  Intake 1080 ml  Output 925 ml  Net 155 ml   Filed Weights   09/10/17 1706 09/11/17 0523 09/12/17 0507  Weight: 64 kg (141 lb) 63.3 kg (139 lb 8 oz) 65 kg (143 lb 3.2 oz)    Physical Exam  Constitutional: Appears well-developed and well-nourished. No distress.   CVS: RRR, S1/S2 + Pulmonary: Effort and breath sounds normal, no stridor, rhonchi, wheezes, rales.  Abdominal: Soft. BS +,  no distension, tenderness, rebound or guarding.  Musculoskeletal: Normal range of motion. No edema and no tenderness.  Lymphadenopathy: No lymphadenopathy noted, cervical, inguinal. Neuro: Alert. Normal reflexes, muscle tone coordination. No cranial nerve deficit. Skin: Skin is warm and dry. No rash noted. Not diaphoretic. No erythema. No pallor.  Psychiatric: Normal mood and affect. Behavior, judgment, thought content normal.    Data Reviewed: I have personally reviewed following labs and imaging studies  CBC: Recent Labs  Lab 09/09/17 2238 09/11/17 0811 09/12/17 0552  WBC 5.1 4.3 3.9*  HGB 13.2 10.8* 10.0*  HCT 39.2 32.6* 30.1*  MCV 101.0* 101.9* 104.2*  PLT 222 172 163   Basic Metabolic Panel: Recent Labs  Lab 09/09/17 2238 09/10/17 0449 09/11/17 0811 09/12/17 0552  NA 129* 130* 135 137  K 3.6 3.7 4.1 4.3  CL 91* 95* 99* 100*  CO2 22 19* 26 27  GLUCOSE 107* 82 93 92  BUN 15 15 8 12   CREATININE 3.06* 2.58* 1.23 1.33*  CALCIUM 9.2 8.3* 8.7* 9.7   GFR: Estimated Creatinine Clearance: 52.3 mL/min (A) (by C-G formula based on SCr of 1.33 mg/dL (H)). Liver Function Tests: Recent Labs  Lab 09/10/17 0449  AST 65*  ALT 40  ALKPHOS 70  BILITOT 1.1  PROT 6.1*  ALBUMIN 3.4*   No  results for input(s): LIPASE, AMYLASE in the last 168 hours. No results for input(s): AMMONIA in the last 168 hours. Coagulation Profile: No results for input(s): INR, PROTIME in the last 168 hours. Cardiac Enzymes: Recent Labs  Lab 09/10/17 0449 09/10/17 1114 09/10/17 1804  TROPONINI <0.03 <0.03 <0.03   BNP (last 3 results) No results for input(s): PROBNP in the last 8760 hours. HbA1C: No results for input(s): HGBA1C in the last 72 hours. CBG: No results for input(s): GLUCAP in the last 168 hours. Lipid Profile: No results for input(s): CHOL, HDL, LDLCALC, TRIG, CHOLHDL, LDLDIRECT in the last 72 hours. Thyroid Function Tests: No results for input(s): TSH, T4TOTAL, FREET4, T3FREE, THYROIDAB in the last 72 hours. Anemia Panel: Recent Labs    09/10/17 0449  VITAMINB12 274   Urine analysis:    Component Value Date/Time   COLORURINE YELLOW 07/12/2017 2100   APPEARANCEUR HAZY (A) 07/12/2017 2100   LABSPEC 1.016 07/12/2017 2100   PHURINE 5.0 07/12/2017 2100   GLUCOSEU 150 (A) 07/12/2017 2100   HGBUR MODERATE (A) 07/12/2017 2100   BILIRUBINUR NEGATIVE 07/12/2017 2100   KETONESUR NEGATIVE 07/12/2017 2100   PROTEINUR 30 (A) 07/12/2017 2100   NITRITE NEGATIVE 07/12/2017 2100   LEUKOCYTESUR NEGATIVE 07/12/2017 2100   Sepsis Labs: @LABRCNTIP (procalcitonin:4,lacticidven:4)   )No results found for this or any previous visit (from the past 240 hour(s)).    Radiology Studies: Dg Neck Soft Tissue  Result Date: 09/10/2017 CLINICAL DATA:  Neck pain. EXAM: NECK SOFT TISSUES - 1+ VIEW COMPARISON:  None. FINDINGS: There is no evidence of retropharyngeal soft tissue swelling or epiglottic enlargement. The cervical airway is unremarkable. No radio-opaque foreign body identified. Soft tissue planes are non suspicious. Multilevel degenerative disc disease in the cervical spine. C5-C6 through the cervicothoracic junction are obscured by overlapping osseous and soft tissue. IMPRESSION: 1.  Unremarkable radiographic appearance of the soft tissues of the neck. 2. Diffuse degenerative disc disease of the cervical spine. Electronically Signed   By: Rubye OaksMelanie  Ehinger M.D.   On: 09/10/2017 06:18   Dg Chest 2 View  Result Date: 09/09/2017 CLINICAL DATA:  Chest, neck and back pain this evening. EXAM: CHEST  2 VIEW COMPARISON:  None. FINDINGS: The heart size and mediastinal contours are within normal limits. Hyperinflated appearance of the lungs without pneumonic consolidation or CHF. No effusion or pneumothorax. The visualized skeletal structures are unremarkable. IMPRESSION: Hyperinflated lungs. Electronically Signed   By: Tollie Ethavid  Kwon M.D.   On: 09/09/2017 23:15   [place] Renal  Result Date: 09/10/2017 CLINICAL DATA:  Acute kidney injury. EXAM: RENAL / URINARY TRACT ULTRASOUND COMPLETE COMPARISON:  CT 05/02/2017 FINDINGS: Right Kidney: Length: 10.1 cm. Echogenicity within normal limits. No mass or hydronephrosis visualized. Left  Kidney: Length: 10 cm. Echogenicity within normal limits. Nonobstructing 4 mm stone in the lower kidney. No mass or hydronephrosis visualized. Bladder: Appears normal for degree of bladder distention. IMPRESSION: Nonobstructing left renal stone.  No hydronephrosis. Electronically Signed   By: Rubye Oaks M.D.   On: 09/10/2017 05:56       Scheduled Meds: . feeding supplement (ENSURE ENLIVE)  237 mL Oral BID BM  . heparin  5,000 Units Subcutaneous Q8H  . traZODone  150-300 mg Oral QHS   Continuous Infusions:   LOS: 2 days    Time spent: 25 minutes  Greater than 50% of the time spent on counseling and coordinating the care.   Manson Passey, MD [facility] Pager [phone]  If 7PM-7AM, please contact night-coverage [email or web address] Password [facility] 09/12/2017, 9:38 AM

## 2017-09-13 DIAGNOSIS — I10 Essential (primary) hypertension: Secondary | ICD-10-CM

## 2017-09-13 DIAGNOSIS — R079 Chest pain, unspecified: Secondary | ICD-10-CM

## 2017-09-13 DIAGNOSIS — R51 Headache: Secondary | ICD-10-CM

## 2017-09-13 DIAGNOSIS — E440 Moderate protein-calorie malnutrition: Secondary | ICD-10-CM

## 2017-09-13 MED ORDER — ADULT MULTIVITAMIN W/MINERALS CH: 1.0000 | ORAL_TABLET | Freq: Every day | ORAL | 1 refills | Status: DC

## 2017-09-13 NOTE — Clinical Social Work Note (Signed)
Clinical Social Work Assessment  Patient Details  Name: Larry Byrd MRN: [id number] Date of Birth: Apr 19, 1956  Date of referral:  09/13/17               Reason for consult:  [employer], Housing Concerns/Homelessness, Discharge Planning                Permission sought to share information with:    Permission granted to share information::  No  Name::        Agency::     Relationship::     Contact Information:     Housing/Transportation Living arrangements for the past 2 months:  Hotel/Motel Source of Information:  Patient, Medical Team Patient Interpreter Needed:  None Criminal Activity/Legal Involvement Pertinent to Current Situation/Hospitalization:  No - Comment as needed Significant Relationships:  Significant Other Lives with:  Significant Other Do you feel safe going back to the place where you live?  No Need for family participation in patient care:  Yes (Comment)  Care giving concerns:  Homelessness.   Social Worker assessment / plan:  CSW met with patient. No supports at bedside. CSW introduced role and inquired about housing situation. Patient stated that prior to admission he and his significant other had been staying at a motel until their money ran out. The patient gets paid next Wednesday and his significant other gets paid on March 1st. They have planned to move into an apartment once they both have been paid. Patient understands the [facility] is first come first serve. Discussed that [employer] requires an application process before he can stay there. The shelter in [facility] on [address] is full according to a Education officer, museum that spoke with them this morning. Patient stated his cousin that lives up [place] where he is from had offered to send him money to move back up there. CSW provided patient with community resources, free meals and food pantry booklets, and a bus pass. Patient wants to stay until tomorrow but discussed that  insurance would stop paying if he is stable for discharge. No further concerns. CSW signing off as social work intervention is no longer needed.  Employment status:  Unemployed Forensic scientist:  Medicaid In [place] PT Recommendations:  No Follow Up Information / Referral to community resources:  Shelter, Other (Comment Required)(Low-income housing options, [employer], [employer] , Bus pass.)  Patient/Family's Response to care:  Patient agreeable to receiving resources. Patient's significant other and cousin are supportive and involved in patient's care. Patient appreciated social work intervention.  Patient/Family's Understanding of and Emotional Response to Diagnosis, Current Treatment, and Prognosis:  Patient has a good understanding of the reason for admission and social work intervention. Patient appears happy with hospital care.  Emotional Assessment Appearance:  Appears stated age Attitude/Demeanor/Rapport:  Engaged, Gracious Affect (typically observed):  Accepting, Appropriate, Calm, Pleasant Orientation:  Oriented to Self, Oriented to Place, Oriented to  Time, Oriented to Situation Alcohol / Substance use:  Never Used Psych involvement (Current and /or in the community):  No (Comment)  Discharge Needs  Concerns to be addressed:  Care Coordination Readmission within the last 30 days:  No Current discharge risk:  Homeless Barriers to Discharge:  No Barriers Identified   Candie Chroman, LCSW 09/13/2017, 2:58 PM

## 2017-09-13 NOTE — Care Management Note (Addendum)
Case Management Note  Patient Details  Name: Larry Byrd MRN: [id number] Date of Birth: 1956-06-04  Subjective/Objective:  Chest Pain                Action/Plan: Patient lives with spouse; PCP is Dr Fleet ContrasEdwin Avbuere; has private insurance with Medicaid with prescription drug coverage; pharmacy of choice is [facility], they were delivering medication to his home and patient is working with them to extend this program; patient states that he and his spouse are having housing issues; Maralyn SagoSarah SW made aware and is to give them resources prior to discharge.  10:58 am - Gilmer MorCane ordered through [facility] and to be delivered to his room today prior to discharging home. Abelino DerrickB Latacha Texe[facility] HospitalRN,MHA,BSN  Expected Discharge Date:  09/13/17               Expected Discharge Plan:  Home/Self Care  Discharge planning Services  CM Consult  Status of Service:  In process, will continue to follow  Reola MosherChandler, Erika Hussar L, RN,MHA,BSN [phone] 09/13/2017, 10:25 AM

## 2017-09-13 NOTE — Discharge Summary (Signed)
Physician Discharge Summary [facility]       Patient: Larry Byrd                   Admit date: 09/09/2017   DOB: Sep 09, 1955             Discharge date:09/13/2017/10:22 AM WUJ:[id number]                           PCP: System, Pcp Not In Recommendations for Outpatient Follow-up:   1.  Please follow-up with your primary care physician within 1-2 weeks.   Discharge Condition: Stable  CODE STATUS:  Full code    Diet recommendation:  Cardiac diet/ healthy diet  ----------------------------------------------------------------------------------------------------------------------  Discharge Diagnoses:   Principal Problem:   Chest pain Active Problems:   Hypertension   AKI (acute kidney injury) ([place])   Hyponatremia   Hypotension   Near syncope   Neck pain   Malnutrition of moderate degree   History of present illness :  Is a 62 y.o.malewith medical history significant forhypertension who presented to ED with lightheadedness and almost passing out, intermittent chest pressure and neck pain for past 24 hours prior to this admission. Patient did have nonspecific malaise over past week and poor appetite. No fevers or chills or cough. No abdominal pain, nausea or vomiting. No loss of consciousness. In ED, blood pressure was 88/62, 12-lead EKG showed sinus rhythm with possible sinus arrhythmia. Chest x-ray showed hyperinflation. Sodium was 129 and creatinine was as high as 3.06, up from baseline of 1.9 recently. He was admitted for evaluation of acute on chronic kidney disease.  Hospital course / Brief Summary:  Chest pain / Lightheadedness  - ACS ruled out, the 3 sets of cardiac enzymes are negative - 12-lead EKG showed no acute ischemic changes - No acute findings on CXR or neck x ray - No chest pain this am - Obtain PT eval since pt reports he is still feeling little lightheaded   Active Problems:   Acute renal failure superimposed on chronic kidney disease  stage III - Recent creatinine baseline 1.9 and creatinine as high as 3.06 on this admission - D/C  Prinizide - Cr now WNL      Hyponatremia - Sodium improved with hydration - Sodium WNL this am     Anemia of chronic kidney disease - Hgb stable   Disposition - stable to be discharged home Has met with the patient needs has been addressed including housing.  Consultations:  None   Procedures: No admission procedures for hospital encounter.   ----------------------------------------------------------------------------------------------------------------------  Discharge Instructions:   Discharge Instructions    Activity as tolerated - No restrictions   Complete by:  As directed    Diet - low sodium heart healthy   Complete by:  As directed    Discharge instructions   Complete by:  As directed    Over the PCP accordingly   Increase activity slowly   Complete by:  As directed        Medication List    TAKE these medications   aspirin 325 MG tablet Take 650 mg by mouth every 4 (four) hours as needed for mild pain.   lisinopril-hydrochlorothiazide 20-12.5 MG tablet Commonly known as:  PRINZIDE,ZESTORETIC Take 1 tablet by mouth daily.   multivitamin with minerals Tabs tablet Take 1 tablet by mouth daily. Start taking on:  09/14/2017   potassium chloride SA 20 MEQ tablet Commonly known as:  K-DUR,KLOR-CON Take 2 tablets (40 mEq total) by mouth daily.   traZODone 150 MG tablet Commonly known as:  DESYREL Take 150-300 mg by mouth at bedtime as needed for sleep.       No Known Allergies    Procedures/Studies: Dg Neck Soft Tissue  Result Date: 09/10/2017 CLINICAL DATA:  Neck pain. EXAM: NECK SOFT TISSUES - 1+ VIEW COMPARISON:  None. FINDINGS: There is no evidence of retropharyngeal soft tissue swelling or epiglottic enlargement. The cervical airway is unremarkable. No radio-opaque foreign body identified. Soft tissue planes are non suspicious. Multilevel  degenerative disc disease in the cervical spine. C5-C6 through the cervicothoracic junction are obscured by overlapping osseous and soft tissue. IMPRESSION: 1. Unremarkable radiographic appearance of the soft tissues of the neck. 2. Diffuse degenerative disc disease of the cervical spine. Electronically Signed   By: Jeb Levering M.D.   On: 09/10/2017 06:18   Dg Chest 2 View  Result Date: 09/09/2017 CLINICAL DATA:  Chest, neck and back pain this evening. EXAM: CHEST  2 VIEW COMPARISON:  None. FINDINGS: The heart size and mediastinal contours are within normal limits. Hyperinflated appearance of the lungs without pneumonic consolidation or CHF. No effusion or pneumothorax. The visualized skeletal structures are unremarkable. IMPRESSION: Hyperinflated lungs. Electronically Signed   By: Ashley Royalty M.D.   On: 09/09/2017 23:15   US Renal  Result Date: 09/10/2017 CLINICAL DATA:  Acute kidney injury. EXAM: RENAL / URINARY TRACT ULTRASOUND COMPLETE COMPARISON:  CT 05/02/2017 FINDINGS: Right Kidney: Length: 10.1 cm. Echogenicity within normal limits. No mass or hydronephrosis visualized. Left Kidney: Length: 10 cm. Echogenicity within normal limits. Nonobstructing 4 mm stone in the lower kidney. No mass or hydronephrosis visualized. Bladder: Appears normal for degree of bladder distention. IMPRESSION: Nonobstructing left renal stone.  No hydronephrosis. Electronically Signed   By: Jeb Levering M.D.   On: 09/10/2017 05:56      Subjective: Patient was seen and examined 09/13/2017, 10:22 AM Patient stable  Today. No acute distress.  No issues overnight Stable for discharge.  Discharge Exam:  Vitals:   09/12/17 0507 09/12/17 1147 09/12/17 2008 09/13/17 0424  BP: 108/63 113/61 128/65 115/68  Pulse: 61 61 66 65  Resp:  _0 Temp: 98.3 F (36.8 C) 98.4 F (36.9 C) 97.7 F (36.5 C) 98.6 F (37 C)  TempSrc: Oral Oral Oral Oral  SpO2: 100% 100% 100% 98%  Weight: 65 kg (143 lb 3.2 oz)   63.9  kg (140 lb 12.8 oz)  Height:        General: Pt lying comfortably in bed & appears in no obvious distress. Cardiovascular: S1 & S2 heard, RRR, S1/S2 +. No murmurs, rubs, gallops or clicks. No JVD or pedal edema. Respiratory: Clear to auscultation without wheezing, rhonchi or crackles. No increased work of breathing. Abdominal:  Non distended, non tender & soft. No organomegaly or masses appreciated. Normal bowel sounds heard. CNS: Alert and oriented. No focal deficits. Extremities: no edema, no cyanosis    The results of significant diagnostics from this hospitalization (including imaging, microbiology, ancillary and laboratory) are listed below for reference.     Microbiology: No results found for this or any previous visit (from the past 240 hour(s)).   Labs: CBC: Recent Labs  Lab 09/09/17 2238 09/10/17 0450 09/11/17 0811 09/12/17 0552  WBC 5.1  --  4.3 3.9*  HGB 13.2  --  10.8* 10.0*  HCT 39.2 34.2* 32.6* 30.1*  MCV 101.0*  --  101.9* 104.2*  PLT 222  --  172 432   Basic Metabolic Panel: Recent Labs  Lab 09/09/17 2238 09/10/17 0449 09/11/17 0811 09/12/17 0552  NA 129* 130* 135 137  K 3.6 3.7 4.1 4.3  CL 91* 95* 99* 100*  CO2 22 19* 26 27  GLUCOSE 107* 82 93 92  BUN _0 CREATININE 3.06* 2.58* 1.23 1.33*  CALCIUM 9.2 8.3* 8.7* 9.7   Liver Function Tests: Recent Labs  Lab 09/10/17 0449  AST 65*  ALT 40  ALKPHOS 70  BILITOT 1.1  PROT 6.1*  ALBUMIN 3.4*   BNP (last 3 results) No results for input(s): BNP in the last 8760 hours. Cardiac Enzymes: Recent Labs  Lab 09/10/17 0449 09/10/17 1114 09/10/17 1804  TROPONINI <0.03 <0.03 <0.03   CBG: No results for input(s): GLUCAP in the last 168 hours. Hgb A1c No results for input(s): HGBA1C in the last 72 hours. Lipid Profile No results for input(s): CHOL, HDL, LDLCALC, TRIG, CHOLHDL, LDLDIRECT in the last 72 hours. Thyroid function studies No results for input(s): TSH, T4TOTAL, T3FREE,  THYROIDAB in the last 72 hours.  Invalid input(s): FREET3 Anemia work up No results for input(s): VITAMINB12, FOLATE, FERRITIN, TIBC, IRON, RETICCTPCT in the last 72 hours. Urinalysis    Component Value Date/Time   COLORURINE YELLOW 07/12/2017 2100   APPEARANCEUR HAZY (A) 07/12/2017 2100   LABSPEC 1.016 07/12/2017 2100   PHURINE 5.0 07/12/2017 2100   GLUCOSEU 150 (A) 07/12/2017 2100   HGBUR MODERATE (A) 07/12/2017 2100   BILIRUBINUR NEGATIVE 07/12/2017 2100   [place] NEGATIVE 07/12/2017 2100   PROTEINUR 30 (A) 07/12/2017 2100   NITRITE NEGATIVE 07/12/2017 2100   LEUKOCYTESUR NEGATIVE 07/12/2017 2100    Time coordinating discharge: Over 30 minutes  SIGNED: Deatra James, MD, FACP, FHM. [facility],  Pager [phone]  If 7PM-7AM, please contact night-coverage [email or web address], Password [facility] 09/13/2017, 10:22 AM

## 2017-09-13 NOTE — Evaluation (Signed)
Physical Therapy Evaluation Patient Details Name: Leonell Lobdell MRN: [id number] DOB: 1955/10/11 Today's Date: 09/13/2017   History of Present Illness  Pt is a 62 y.o. male admitted 09/09/17 with c/o lightheadedness, chest pressure, and neck pain. Worked up for acute renal failure superimposed on CKD III. CXR only showing hyperinflation. Cervical xray showing diffuse degenerative disc disease; unremarkable for soft tissue deformity. PMH includes HTN.    Clinical Impression  Pt presents with an overall decrease in functional mobility secondary to above. PTA, pt indep with mobility; lives with wife, but recently lost home and has no family/friends available to provide support. Today, pt able to ambulate with intermittent UE support and supervision for safety. Only c/o balance was upon returning to room (BP WFL - see values below). Pt's main concerns are current homelessness and continued c/o intermittent dizziness. Pt would benefit from continued acute PT services to maximize functional mobility and independence prior to d/c.   Sitting BP 109/75 Standing BP 113/56 Standing 2 min BP 114/62     Follow Up Recommendations No PT follow up;Supervision - Intermittent    Equipment Recommendations  Cane    Recommendations for Other Services       Precautions / Restrictions Precautions Precautions: Fall Restrictions Weight Bearing Restrictions: No      Mobility  Bed Mobility Overal bed mobility: Independent                Transfers Overall transfer level: Independent                  Ambulation/Gait Ambulation/Gait assistance: Supervision Ambulation Distance (Feet): 150 Feet Assistive device: None Gait Pattern/deviations: Step-through pattern;Decreased stride length;Drifts right/left Gait velocity: Decreased Gait velocity interpretation: <1.8 ft/sec, indicative of risk for recurrent falls General Gait Details: Slightly unsteady ambution with supervision for balance.  Pt intermittently reaching for UE support on rail in hallway. Educ on [facility] use. Did not c/o dizziness until returning to room  Stairs            Wheelchair Mobility    Modified Rankin (Stroke Patients Only)       Balance Overall balance assessment: Needs assistance   Sitting balance-Leahy Scale: Good       Standing balance-Leahy Scale: Fair                               Pertinent Vitals/Pain Pain Assessment: No/denies pain    Home Living Family/patient expects to be discharged to:: Unsure(homeless) Living Arrangements: Spouse/significant other               Additional Comments: Pt reports had been staying at hotel PTA, but has run out of money. No family/friends nearby who can assist    Prior Function Level of Independence: Independent               Hand Dominance        Extremity/Trunk Assessment   Upper Extremity Assessment Upper Extremity Assessment: Overall WFL for tasks assessed    Lower Extremity Assessment Lower Extremity Assessment: Overall WFL for tasks assessed       Communication   Communication: No difficulties  Cognition Arousal/Alertness: Awake/alert Behavior During Therapy: WFL for tasks assessed/performed Overall Cognitive Status: Within Functional Limits for tasks assessed  General Comments      Exercises     Assessment/Plan    PT Assessment Patient needs continued PT services  PT Problem List Decreased activity tolerance;Decreased balance;Decreased mobility;Decreased knowledge of use of DME       PT Treatment Interventions DME instruction;Gait training;Stair training;Functional mobility training;Therapeutic activities;Therapeutic exercise;Balance training;Patient/family education    PT Goals (Current goals can be found in the Care Plan section)  Acute Rehab PT Goals Patient Stated Goal: Decreased dizziness PT Goal Formulation: With  patient Time For Goal Achievement: 09/27/17 Potential to Achieve Goals: Good    Frequency Min 3X/week   Barriers to discharge Other (comment) Homeless    Co-evaluation               AM-PAC PT "6 Clicks" Daily Activity  Outcome Measure Difficulty turning over in bed (including adjusting bedclothes, sheets and blankets)?: None Difficulty moving from lying on back to sitting on the side of the bed? : None Difficulty sitting down on and standing up from a chair with arms (e.g., wheelchair, bedside commode, etc,.)?: None Help needed moving to and from a bed to chair (including a wheelchair)?: A Little Help needed walking in hospital room?: A Little Help needed climbing 3-5 steps with a railing? : A Little 6 Click Score: 21    End of Session Equipment Utilized During Treatment: Gait belt Activity Tolerance: Patient tolerated treatment well Patient left: in bed;with call bell/phone within reach Nurse Communication: Mobility status PT Visit Diagnosis: Other abnormalities of gait and mobility (R26.89)    Time: [id number] PT Time Calculation (min) (ACUTE ONLY): 17 min   Charges:   PT Evaluation $PT Eval Moderate Complexity: 1 Mod     PT G Codes:       Ina HomesJaclyn Genene Kilman, PT, DPT Acute Rehab Services  Pager: [phone]  Malachy ChamberJaclyn L Inell Mimbs 09/13/2017, 11:41 AM

## 2017-09-13 NOTE — Plan of Care (Signed)
  Education: Knowledge of General Education information will improve 09/13/2017 0054 - Progressing by Elnita Maxwellodoo, Albie Arizpe A, RN   Coping: Level of anxiety will decrease 09/13/2017 0054 - Progressing by Elnita Maxwellodoo, Khizar Fiorella A, RN   Pain Managment: General experience of comfort will improve 09/13/2017 0054 - Progressing by Elnita Maxwellodoo, Mickael Mcnutt A, RN   Safety: Ability to remain free from injury will improve 09/13/2017 0054 - Progressing by Elnita Maxwellodoo, Aryanne Gilleland A, RN

## 2017-10-08 ENCOUNTER — Emergency Department (HOSPITAL_COMMUNITY)
Admission: EM | Admit: 2017-10-08 | Discharge: 2017-10-08 | Disposition: A | Discharge status: Home / Self Care | Payer: Medicaid Other | Attending: Emergency Medicine | Admitting: Emergency Medicine

## 2017-10-08 ENCOUNTER — Other Ambulatory Visit: Payer: Self-pay

## 2017-10-08 ENCOUNTER — Encounter (HOSPITAL_COMMUNITY): Payer: Self-pay | Admitting: Emergency Medicine

## 2017-10-08 DIAGNOSIS — R748 Abnormal levels of other serum enzymes: Secondary | ICD-10-CM | POA: Diagnosis not present

## 2017-10-08 DIAGNOSIS — Z7982 Long term (current) use of aspirin: Secondary | ICD-10-CM | POA: Insufficient documentation

## 2017-10-08 DIAGNOSIS — F17210 Nicotine dependence, cigarettes, uncomplicated: Secondary | ICD-10-CM | POA: Diagnosis not present

## 2017-10-08 DIAGNOSIS — Z79899 Other long term (current) drug therapy: Secondary | ICD-10-CM | POA: Insufficient documentation

## 2017-10-08 DIAGNOSIS — I10 Essential (primary) hypertension: Secondary | ICD-10-CM | POA: Diagnosis not present

## 2017-10-08 DIAGNOSIS — Z9119 Patient's noncompliance with other medical treatment and regimen: Secondary | ICD-10-CM | POA: Diagnosis not present

## 2017-10-08 HISTORY — DX: Major depressive disorder, single episode, unspecified: F32.9

## 2017-10-08 LAB — CBC WITH DIFFERENTIAL/PLATELET
Basophils Absolute: 0 10*3/uL (ref 0.0–0.1)
Basophils Relative: 1 %
EOS ABS: 0.1 10*3/uL (ref 0.0–0.7)
EOS PCT: 3 %
HCT: 37.3 % — ABNORMAL LOW (ref 39.0–52.0)
Hemoglobin: 12.2 g/dL — ABNORMAL LOW (ref 13.0–17.0)
LYMPHS ABS: 1.8 10*3/uL (ref 0.7–4.0)
LYMPHS PCT: 53 %
MCH: 33.7 pg (ref 26.0–34.0)
MCHC: 32.7 g/dL (ref 30.0–36.0)
MCV: 103 fL — AB (ref 78.0–100.0)
MONO ABS: 0.4 10*3/uL (ref 0.1–1.0)
Monocytes Relative: 12 %
Neutro Abs: 1.1 10*3/uL — ABNORMAL LOW (ref 1.7–7.7)
Neutrophils Relative %: 31 %
PLATELETS: 124 10*3/uL — AB (ref 150–400)
RBC: 3.62 MIL/uL — AB (ref 4.22–5.81)
RDW: 14.8 % (ref 11.5–15.5)
WBC: 3.4 10*3/uL — AB (ref 4.0–10.5)

## 2017-10-08 LAB — COMPREHENSIVE METABOLIC PANEL
ALT: 108 U/L — ABNORMAL HIGH (ref 17–63)
ANION GAP: 12 (ref 5–15)
AST: 225 U/L — ABNORMAL HIGH (ref 15–41)
Albumin: 3.9 g/dL (ref 3.5–5.0)
Alkaline Phosphatase: 88 U/L (ref 38–126)
BUN: 8 mg/dL (ref 6–20)
CHLORIDE: 100 mmol/L — AB (ref 101–111)
CO2: 25 mmol/L (ref 22–32)
Calcium: 8.4 mg/dL — ABNORMAL LOW (ref 8.9–10.3)
Creatinine, Ser: 1.04 mg/dL (ref 0.61–1.24)
Glucose, Bld: 100 mg/dL — ABNORMAL HIGH (ref 65–99)
POTASSIUM: 3.5 mmol/L (ref 3.5–5.1)
SODIUM: 137 mmol/L (ref 135–145)
Total Bilirubin: 0.7 mg/dL (ref 0.3–1.2)
Total Protein: 7 g/dL (ref 6.5–8.1)

## 2017-10-08 LAB — TSH: TSH: 0.681 u[IU]/mL (ref 0.350–4.500)

## 2017-10-08 MED ORDER — AMLODIPINE BESYLATE 5 MG PO TABS: 5.0000 mg | ORAL_TABLET | Freq: Every day | ORAL | 1 refills | Status: DC

## 2017-10-08 NOTE — Discharge Instructions (Signed)
Your creatinine [kidney] has normalized.  Do not take lisinopril.  Prescription for Norvasc or amlodipine 5 mg daily.  Follow-up your primary care doctor.

## 2017-10-08 NOTE — ED Triage Notes (Signed)
Patient states he is scared to take his blood pressure medications because it lowers his blood pressure.

## 2017-10-08 NOTE — ED Notes (Signed)
Pt reports not taking his blood pressure medications since he was [facility] last month because it lowered his blood pressure to "around 80/60."

## 2017-10-09 ENCOUNTER — Encounter (HOSPITAL_COMMUNITY): Payer: Self-pay | Admitting: Emergency Medicine

## 2017-10-09 ENCOUNTER — Emergency Department (HOSPITAL_COMMUNITY)
Admission: EM | Admit: 2017-10-09 | Discharge: 2017-10-09 | Disposition: A | Discharge status: Home / Self Care | Payer: Medicaid Other | Attending: Emergency Medicine | Admitting: Emergency Medicine

## 2017-10-09 DIAGNOSIS — F17210 Nicotine dependence, cigarettes, uncomplicated: Secondary | ICD-10-CM | POA: Diagnosis not present

## 2017-10-09 DIAGNOSIS — R109 Unspecified abdominal pain: Secondary | ICD-10-CM | POA: Insufficient documentation

## 2017-10-09 DIAGNOSIS — Z7982 Long term (current) use of aspirin: Secondary | ICD-10-CM | POA: Insufficient documentation

## 2017-10-09 DIAGNOSIS — I10 Essential (primary) hypertension: Secondary | ICD-10-CM | POA: Insufficient documentation

## 2017-10-09 DIAGNOSIS — R748 Abnormal levels of other serum enzymes: Secondary | ICD-10-CM | POA: Diagnosis not present

## 2017-10-09 DIAGNOSIS — Z79899 Other long term (current) drug therapy: Secondary | ICD-10-CM | POA: Insufficient documentation

## 2017-10-09 DIAGNOSIS — R030 Elevated blood-pressure reading, without diagnosis of hypertension: Secondary | ICD-10-CM

## 2017-10-09 LAB — CBC
HEMATOCRIT: 39.9 % (ref 39.0–52.0)
HEMOGLOBIN: 13.5 g/dL (ref 13.0–17.0)
MCH: 34.4 pg — ABNORMAL HIGH (ref 26.0–34.0)
MCHC: 33.8 g/dL (ref 30.0–36.0)
MCV: 101.8 fL — ABNORMAL HIGH (ref 78.0–100.0)
Platelets: 120 10*3/uL — ABNORMAL LOW (ref 150–400)
RBC: 3.92 MIL/uL — ABNORMAL LOW (ref 4.22–5.81)
RDW: 14.8 % (ref 11.5–15.5)
WBC: 3.6 10*3/uL — ABNORMAL LOW (ref 4.0–10.5)

## 2017-10-09 LAB — URINALYSIS, ROUTINE W REFLEX MICROSCOPIC
BACTERIA UA: NONE SEEN
BILIRUBIN URINE: NEGATIVE
Glucose, UA: NEGATIVE mg/dL
Ketones, ur: NEGATIVE mg/dL
LEUKOCYTES UA: NEGATIVE
Nitrite: NEGATIVE
Protein, ur: 30 mg/dL — AB
RBC / HPF: NONE SEEN RBC/hpf (ref 0–5)
SPECIFIC GRAVITY, URINE: 1.012 (ref 1.005–1.030)
WBC, UA: NONE SEEN WBC/hpf (ref 0–5)
pH: 5 (ref 5.0–8.0)

## 2017-10-09 LAB — COMPREHENSIVE METABOLIC PANEL
ALT: 111 U/L — ABNORMAL HIGH (ref 17–63)
ANION GAP: 16 — AB (ref 5–15)
AST: 190 U/L — ABNORMAL HIGH (ref 15–41)
Albumin: 4 g/dL (ref 3.5–5.0)
Alkaline Phosphatase: 91 U/L (ref 38–126)
BILIRUBIN TOTAL: 0.8 mg/dL (ref 0.3–1.2)
BUN: 6 mg/dL (ref 6–20)
CO2: 22 mmol/L (ref 22–32)
Calcium: 8.6 mg/dL — ABNORMAL LOW (ref 8.9–10.3)
Chloride: 102 mmol/L (ref 101–111)
Creatinine, Ser: 1.11 mg/dL (ref 0.61–1.24)
GFR calc non Af Amer: >60 mL/min (ref >60)
Glucose, Bld: 87 mg/dL (ref 65–99)
POTASSIUM: 4.5 mmol/L (ref 3.5–5.1)
Sodium: 140 mmol/L (ref 135–145)
TOTAL PROTEIN: 7.1 g/dL (ref 6.5–8.1)

## 2017-10-09 LAB — LIPASE, BLOOD: LIPASE: 33 U/L (ref 11–51)

## 2017-10-09 MED ORDER — ONDANSETRON 4 MG PO TBDP: 4.0000 mg | ORAL_TABLET | Freq: Once | ORAL | Status: DC | PRN

## 2017-10-09 NOTE — ED Provider Notes (Signed)
[facility] EMERGENCY DEPARTMENT Provider Note   CSN: [id number] Arrival date & time: 10/09/17  0123     History   Chief Complaint Chief Complaint  Patient presents with  . Hypertension    HPI Larry Byrd is a 62 y.o. male.  HPI Patient presents by EMS.  Initially told staff that he called EMS to be with his girlfriend.  His complaining that his blood pressure has been variable and he has not taking his blood pressure medication.  Has not followed up with his primary physician.  States he is wanting to change to a different doctor.  Also complains of intermittent abdominal pain.  None currently.  No nausea or vomiting.  No melanotic or grossly bloody stool.  No focal weakness or numbness.  Admits to drinking alcohol heavily. Past Medical History:  Diagnosis Date  . Diverticulosis   . Enlarged prostate   . Hypertension   . Major depression, chronic     Patient Active Problem List   Diagnosis Date Noted  . Malnutrition of moderate degree 09/13/2017  . Hypertension 09/10/2017  . AKI (acute kidney injury) (HCC) 09/10/2017  . Chest pain 09/10/2017  . Hyponatremia 09/10/2017  . Hypotension 09/10/2017  . Near syncope 09/10/2017  . Neck pain 09/10/2017    History reviewed. No pertinent surgical history.     Home Medications    Prior to Admission medications   Medication Sig Start Date End Date Taking? Authorizing Provider  amLODipine (NORVASC) 5 MG tablet Take 1 tablet (5 mg total) by mouth daily. 10/08/17   Donnetta Hutching, MD  aspirin 325 MG tablet Take 650 mg by mouth every 4 (four) hours as needed for mild pain.    [provider]  diphenhydrAMINE (BENADRYL) 25 MG tablet Take 25 mg by mouth every 6 (six) hours as needed for sleep.    [provider]  lisinopril-hydrochlorothiazide (PRINZIDE,ZESTORETIC) 20-12.5 MG tablet Take 1 tablet by mouth daily. Patient not taking: Reported on 10/08/2017 08/11/17   Elson Areas, PA-C  Multiple  Vitamin (MULTIVITAMIN WITH MINERALS) TABS tablet Take 1 tablet by mouth daily. Patient not taking: Reported on 10/08/2017 09/14/17   Kendell Bane, MD  potassium chloride SA (K-DUR,KLOR-CON) 20 MEQ tablet Take 2 tablets (40 mEq total) by mouth daily. 08/11/17   Elson Areas, PA-C    Family History No family history on file.  Social History Social History   Tobacco Use  . Smoking status: Current Every Day Smoker    Types: Cigarettes  . Smokeless tobacco: Never Used  Substance Use Topics  . Alcohol use: Yes  . Drug use: No     Allergies   Patient has no known allergies.   Review of Systems Review of Systems  Constitutional: Negative for chills and fever.  HENT: Negative for congestion, sinus pressure, sore throat and trouble swallowing.   Eyes: Negative for photophobia and visual disturbance.  Respiratory: Negative for cough and shortness of breath.   Cardiovascular: Negative for chest pain, palpitations and leg swelling.  Gastrointestinal: Positive for abdominal pain. Negative for constipation, diarrhea, nausea and vomiting.  Genitourinary: Negative for dysuria, flank pain, frequency and hematuria.  Musculoskeletal: Negative for back pain, myalgias and neck pain.  Skin: Negative for rash and wound.  Neurological: Negative for dizziness, syncope, weakness, light-headedness, numbness and headaches.  All other systems reviewed and are negative.    Physical Exam Updated Vital Signs BP (!) 145/98   Pulse 76   Temp 98.3 F (36.8  C) (Oral)   Resp 16   Ht 5\' 10"  (1.778 m)   Wt 63.5 kg (140 lb)   SpO2 98%   BMI 20.09 kg/m   Physical Exam  Constitutional: He is oriented to person, place, and time. He appears well-developed and well-nourished. No distress.  Resting comfortably.  Easily aroused.  HENT:  Head: Normocephalic and atraumatic.  Mouth/Throat: Oropharynx is clear and moist. No oropharyngeal exudate.  Eyes: EOM are normal. Pupils are equal, round, and  reactive to light.  Neck: Normal range of motion. Neck supple.  Cardiovascular: Normal rate and regular rhythm. Exam reveals no gallop and no friction rub.  No murmur heard. Pulmonary/Chest: Effort normal and breath sounds normal. No stridor. No respiratory distress. He has no wheezes. He has no rales. He exhibits no tenderness.  Abdominal: Soft. Bowel sounds are normal. There is no tenderness. There is no rebound and no guarding.  Musculoskeletal: Normal range of motion. He exhibits no edema or tenderness.  No lower extremity swelling, assymetry or tenderness.  No  Lymphadenopathy:    He has no cervical adenopathy.  Neurological: He is alert and oriented to person, place, and time.  Moves all extremities without focal deficit.  Sensation intact.  Skin: Skin is warm and dry. Capillary refill takes less than 2 seconds. No rash noted. He is not diaphoretic. No erythema.  Psychiatric: He has a normal mood and affect. His behavior is normal.  Nursing note and vitals reviewed.    ED Treatments / Results  Labs (all labs ordered are listed, but only abnormal results are displayed) Labs Reviewed  COMPREHENSIVE METABOLIC PANEL - Abnormal; Notable for the following components:      Result Value   Calcium 8.6 (*)    AST 190 (*)    ALT 111 (*)    Anion gap 16 (*)    All other components within normal limits  CBC - Abnormal; Notable for the following components:   WBC 3.6 (*)    RBC 3.92 (*)    MCV 101.8 (*)    MCH 34.4 (*)    Platelets 120 (*)    All other components within normal limits  URINALYSIS, ROUTINE W REFLEX MICROSCOPIC - Abnormal; Notable for the following components:   Hgb urine dipstick SMALL (*)    Protein, ur 30 (*)    Squamous Epithelial / LPF 0-5 (*)    All other components within normal limits  LIPASE, BLOOD    EKG  EKG Interpretation None       Radiology No results found.  Procedures Procedures (including critical care time)  Medications Ordered in  ED Medications  ondansetron (ZOFRAN-ODT) disintegrating tablet 4 mg (not administered)     Initial Impression / Assessment and Plan / ED Course  I have reviewed the triage vital signs and the nursing notes.  Pertinent labs & imaging results that were available during my care of the patient were reviewed by me and considered in my medical decision making (see chart for details).    Given referral to health and wellness clinic.  Needs follow-up for blood pressure.  Abdominal exam is benign.  Mild elevation in LFTs which are improved from yesterday.  Likely due to alcohol consumption.  Advised to decrease alcohol intake.  Can have liver enzymes repeated as an outpatient basis.  Return precautions given.   Final Clinical Impressions(s) / ED Diagnoses   Final diagnoses:  Transient elevated blood pressure  Elevated liver enzymes    ED Discharge Orders  None       Loren Racer, MD 10/09/17 1153

## 2017-10-09 NOTE — ED Triage Notes (Signed)
Brought by ems from [facility].  Per patient wanted to be with his girlfriend who was also brought here.  C/o Htn.  Per patient doesn't want to take bp meds anymore because he thinks it drops too low.  Per patient also feels nauseated and has abdominal pain that "has been happening for a while".  Pointing to RU and LU quads.

## 2017-10-09 NOTE — ED Notes (Signed)
Pt ambulated to room from waiting room, tolerated well. 

## 2017-10-09 NOTE — ED Notes (Addendum)
Refusing blood work, urinalysis, and any medications at this time.

## 2017-10-09 NOTE — ED Provider Notes (Signed)
[facility]-EMERGENCY DEPT Provider Note   CSN: [id number] Arrival date & time: 10/08/17  0407     History   Chief Complaint Chief Complaint  Patient presents with  . Hypertension    HPI Larry Byrd is a 62 y.o. male.  Patient presents with a concern of increasing blood pressure.  He has been on lisinopril 20 mg in the past, but has been non compliant with this for several months.  He felt like he "might pass out".  His creatinine has been elevated in the past.  No substernal chest pain, dyspnea, gross neurological deficits, prodromal illnesses.  Severity of symptoms is mild.  Nothing makes symptoms better or worse.      Past Medical History:  Diagnosis Date  . Diverticulosis   . Enlarged prostate   . Hypertension   . Major depression, chronic     Patient Active Problem List   Diagnosis Date Noted  . Malnutrition of moderate degree 09/13/2017  . Hypertension 09/10/2017  . AKI (acute kidney injury) (HCC) 09/10/2017  . Chest pain 09/10/2017  . Hyponatremia 09/10/2017  . Hypotension 09/10/2017  . Near syncope 09/10/2017  . Neck pain 09/10/2017    History reviewed. No pertinent surgical history.     Home Medications    Prior to Admission medications   Medication Sig Start Date End Date Taking? Authorizing Provider  aspirin 325 MG tablet Take 650 mg by mouth every 4 (four) hours as needed for mild pain.   Yes [provider]  diphenhydrAMINE (BENADRYL) 25 MG tablet Take 25 mg by mouth every 6 (six) hours as needed for sleep.   Yes [provider]  potassium chloride SA (K-DUR,KLOR-CON) 20 MEQ tablet Take 2 tablets (40 mEq total) by mouth daily. 08/11/17  Yes Cheron Schaumann K, PA-C  amLODipine (NORVASC) 5 MG tablet Take 1 tablet (5 mg total) by mouth daily. 10/08/17   Donnetta Hutching, MD  lisinopril-hydrochlorothiazide (PRINZIDE,ZESTORETIC) 20-12.5 MG tablet Take 1 tablet by mouth daily. Patient not taking: Reported on 10/08/2017  08/11/17   Elson Areas, PA-C  Multiple Vitamin (MULTIVITAMIN WITH MINERALS) TABS tablet Take 1 tablet by mouth daily. Patient not taking: Reported on 10/08/2017 09/14/17   Kendell Bane, MD    Family History History reviewed. No pertinent family history.  Social History Social History   Tobacco Use  . Smoking status: Current Every Day Smoker    Types: Cigarettes  . Smokeless tobacco: Never Used  Substance Use Topics  . Alcohol use: Yes  . Drug use: No     Allergies   Patient has no known allergies.   Review of Systems Review of Systems  All other systems reviewed and are negative.    Physical Exam Updated Vital Signs BP 125/73 (BP Location: Left Arm)   Pulse 83   Temp 97.8 F (36.6 C) (Oral)   Resp 18   Ht 5\' 10"  (1.778 m)   Wt 63.5 kg (140 lb)   SpO2 98%   BMI 20.09 kg/m   Physical Exam  Constitutional: He is oriented to person, place, and time. He appears well-developed and well-nourished.  HENT:  Head: Normocephalic and atraumatic.  Eyes: Conjunctivae are normal.  Neck: Neck supple.  Cardiovascular: Normal rate and regular rhythm.  Pulmonary/Chest: Effort normal and breath sounds normal.  Abdominal: Soft. Bowel sounds are normal.  Musculoskeletal: Normal range of motion.  Neurological: He is alert and oriented to person, place, and time.  Skin: Skin is warm and dry.  Psychiatric: He has a normal mood and affect. His behavior is normal.  Nursing note and vitals reviewed.    ED Treatments / Results  Labs (all labs ordered are listed, but only abnormal results are displayed) Labs Reviewed  CBC WITH DIFFERENTIAL/PLATELET - Abnormal; Notable for the following components:      Result Value   WBC 3.4 (*)    RBC 3.62 (*)    Hemoglobin 12.2 (*)    HCT 37.3 (*)    MCV 103.0 (*)    Platelets 124 (*)    Neutro Abs 1.1 (*)    All other components within normal limits  COMPREHENSIVE METABOLIC PANEL - Abnormal; Notable for the following components:    Chloride 100 (*)    Glucose, Bld 100 (*)    Calcium 8.4 (*)    AST 225 (*)    ALT 108 (*)    All other components within normal limits  TSH    EKG  EKG Interpretation  Date/Time:  Saturday October 08 2017 12:52:42 EDT Ventricular Rate:  75 PR Interval:    QRS Duration: 102 QT Interval:  387 QTC Calculation: 433 R Axis:   85 Text Interpretation:  Sinus rhythm Borderline right axis deviation Nonspecific T abnrm, anterolateral leads Confirmed by Donnetta Hutchingook, Elfida Shimada ([id number]) on 10/08/2017 1:02:19 PM       Radiology No results found.  Procedures Procedures (including critical care time)  Medications Ordered in ED Medications - No data to display   Initial Impression / Assessment and Plan / ED Course  I have reviewed the triage vital signs and the nursing notes.  Pertinent labs & imaging results that were available during my care of the patient were reviewed by me and considered in my medical decision making (see chart for details).    Patient presents with concerns about his blood pressure.  His creatinine is normalized.  However, his liver functions are slightly elevated.  I called the phone number given to me on the demographic sheet [phone] and left a message concerning his mildly elevated liver functions.  Will start Norvasc 5 mg daily.  He will follow-up with primary care.   Final Clinical Impressions(s) / ED Diagnoses   Final diagnoses:  Hypertension, unspecified type  Elevated liver enzymes    ED Discharge Orders        Ordered    amLODipine (NORVASC) 5 MG tablet  Daily     10/08/17 1555       Donnetta Hutchingook, Pernella Ackerley, MD 10/09/17 1725

## 2017-10-12 ENCOUNTER — Encounter (HOSPITAL_COMMUNITY): Payer: Self-pay | Admitting: Emergency Medicine

## 2017-10-12 DIAGNOSIS — Z590 Homelessness: Secondary | ICD-10-CM | POA: Diagnosis not present

## 2017-10-12 DIAGNOSIS — Z7982 Long term (current) use of aspirin: Secondary | ICD-10-CM | POA: Insufficient documentation

## 2017-10-12 DIAGNOSIS — Z79899 Other long term (current) drug therapy: Secondary | ICD-10-CM | POA: Diagnosis not present

## 2017-10-12 DIAGNOSIS — R0789 Other chest pain: Secondary | ICD-10-CM | POA: Diagnosis not present

## 2017-10-12 DIAGNOSIS — F17210 Nicotine dependence, cigarettes, uncomplicated: Secondary | ICD-10-CM | POA: Diagnosis not present

## 2017-10-12 DIAGNOSIS — I10 Essential (primary) hypertension: Secondary | ICD-10-CM | POA: Insufficient documentation

## 2017-10-12 DIAGNOSIS — R51 Headache: Secondary | ICD-10-CM | POA: Diagnosis present

## 2017-10-12 LAB — I-STAT CHEM 8, ED
BUN: 3 mg/dL — ABNORMAL LOW (ref 6–20)
CHLORIDE: 96 mmol/L — AB (ref 101–111)
CREATININE: 1.4 mg/dL — AB (ref 0.61–1.24)
Calcium, Ion: 1.03 mmol/L — ABNORMAL LOW (ref 1.15–1.40)
GLUCOSE: 90 mg/dL (ref 65–99)
HEMATOCRIT: 41 % (ref 39.0–52.0)
Hemoglobin: 13.9 g/dL (ref 13.0–17.0)
POTASSIUM: 3.4 mmol/L — AB (ref 3.5–5.1)
Sodium: 134 mmol/L — ABNORMAL LOW (ref 135–145)
TCO2: 27 mmol/L (ref 22–32)

## 2017-10-12 LAB — I-STAT TROPONIN, ED: TROPONIN I, POC: 0.01 ng/mL (ref 0.00–0.08)

## 2017-10-12 LAB — DIFFERENTIAL
BASOS PCT: 1 %
Basophils Absolute: 0 10*3/uL (ref 0.0–0.1)
Eosinophils Absolute: 0.1 10*3/uL (ref 0.0–0.7)
Eosinophils Relative: 1 %
LYMPHS ABS: 1.4 10*3/uL (ref 0.7–4.0)
Lymphocytes Relative: 40 %
MONOS PCT: 9 %
Monocytes Absolute: 0.3 10*3/uL (ref 0.1–1.0)
NEUTROS ABS: 1.8 10*3/uL (ref 1.7–7.7)
NEUTROS PCT: 49 %

## 2017-10-12 LAB — APTT: APTT: 26 s (ref 24–36)

## 2017-10-12 LAB — PROTIME-INR
INR: 0.92
Prothrombin Time: 12.3 seconds (ref 11.4–15.2)

## 2017-10-12 NOTE — ED Triage Notes (Signed)
Pt presents with multiple complaints. States he has had HA since this afternoon, reports BP has been elevated. Also reports chest discomfort, pressure, 6/10. Pt rambling in triage, will not sit still.

## 2017-10-13 ENCOUNTER — Emergency Department (HOSPITAL_COMMUNITY): Payer: Medicaid Other

## 2017-10-13 ENCOUNTER — Emergency Department (HOSPITAL_COMMUNITY)
Admission: EM | Admit: 2017-10-13 | Discharge: 2017-10-13 | Disposition: A | Discharge status: Home / Self Care | Payer: Medicaid Other | Attending: Emergency Medicine | Admitting: Emergency Medicine

## 2017-10-13 DIAGNOSIS — Z5900 Homelessness unspecified: Secondary | ICD-10-CM

## 2017-10-13 DIAGNOSIS — R0789 Other chest pain: Secondary | ICD-10-CM

## 2017-10-13 LAB — COMPREHENSIVE METABOLIC PANEL
ALBUMIN: 4 g/dL (ref 3.5–5.0)
ALT: 119 U/L — AB (ref 17–63)
AST: 270 U/L — ABNORMAL HIGH (ref 15–41)
Alkaline Phosphatase: 101 U/L (ref 38–126)
Anion gap: 15 (ref 5–15)
CHLORIDE: 95 mmol/L — AB (ref 101–111)
CO2: 24 mmol/L (ref 22–32)
CREATININE: 1.03 mg/dL (ref 0.61–1.24)
Calcium: 8.8 mg/dL — ABNORMAL LOW (ref 8.9–10.3)
GFR calc Af Amer: >60 mL/min (ref >60)
GLUCOSE: 89 mg/dL (ref 65–99)
Potassium: 3.3 mmol/L — ABNORMAL LOW (ref 3.5–5.1)
Sodium: 134 mmol/L — ABNORMAL LOW (ref 135–145)
Total Bilirubin: 1.5 mg/dL — ABNORMAL HIGH (ref 0.3–1.2)
Total Protein: 7.3 g/dL (ref 6.5–8.1)

## 2017-10-13 LAB — CBC
HEMATOCRIT: 36.1 % — AB (ref 39.0–52.0)
HEMOGLOBIN: 12.5 g/dL — AB (ref 13.0–17.0)
MCH: 34.9 pg — ABNORMAL HIGH (ref 26.0–34.0)
MCHC: 34.6 g/dL (ref 30.0–36.0)
MCV: 100.8 fL — ABNORMAL HIGH (ref 78.0–100.0)
Platelets: 91 10*3/uL — ABNORMAL LOW (ref 150–400)
RBC: 3.58 MIL/uL — ABNORMAL LOW (ref 4.22–5.81)
RDW: 14.4 % (ref 11.5–15.5)
WBC: 3.7 10*3/uL — ABNORMAL LOW (ref 4.0–10.5)

## 2017-10-13 LAB — RAPID URINE DRUG SCREEN, HOSP PERFORMED
AMPHETAMINES: NOT DETECTED
BARBITURATES: NOT DETECTED
BENZODIAZEPINES: NOT DETECTED
Cocaine: NOT DETECTED
Opiates: NOT DETECTED
Tetrahydrocannabinol: NOT DETECTED

## 2017-10-13 LAB — ETHANOL: ALCOHOL ETHYL (B): 212 mg/dL — AB (ref <10)

## 2017-10-13 NOTE — Progress Notes (Signed)
CSW spoke with pt at bedside. Pt informed CSW that pt and companion are homeless and needing other options for living. CSW spoke with pt about local shelter. Pt expressed that pt dan companion have been at the shelter in the past and then told by staff that they are coming to frequently. Pt reports "I know you all are getting tired of seeing me and her but we don't have any where to go". CSW expressed verbal understanding of this and offered support in any way at this time. CSW was informed that pt and companion gets over $2,000 a month in money but have had a hard time with finding housing that is affordable. CSW suggested that pt and companion reach out to either DSS or [facility] to see if they are able to assist with finding affordable housing options for pt at this time. Pt expressed being agreeable and willing to take resources that CSW has to offer. At this time there are no further CSW needs. CSW will sign off.   Claude MangesKierra S. Abdo Denault, MSW, LCSW-A Emergency Department Clinical Social Worker [phone]

## 2017-10-13 NOTE — Discharge Instructions (Addendum)
As discussed, your evaluation today has been largely reassuring.  But, it is important that you monitor your condition carefully, and do not hesitate to return to the ED if you develop new, or concerning changes in your condition.  Please use the provided resources for assistance with locating housing assistance, and health care follow-up.

## 2017-10-13 NOTE — ED Provider Notes (Signed)
[facility]   CSN: [id number] Arrival date & time: 10/12/17  2219     History   Chief Complaint Chief Complaint  Patient presents with  . Chest Pain    HPI Larry Byrd is a 62 y.o. male.  HPI Patient presents with multiple concerns. He notes that he is homeless, drinks alcohol, lives with a male companion in a tent or in hotels. He notes that yesterday, upon awakening, about 24 hours ago he had a headache, unsteadiness, without focal weakness, without confusion.  And there was soon thereafter, diffuse chest pain, mild nausea.  The chest pain was sore, diffuse, nonradiating, consistent, with no clear alleviating or exacerbating factors. No vomiting, no vision changes, no falling, no syncope.  Patient did not take any medication for relief, and symptoms seemingly were worse with ambulation. No confusion, no disorientation. Given the after mentioned concerns, he presents for evaluation. He acknowledges relying on the emergency department for medical care, states that he has no access to other options.  Past Medical History:  Diagnosis Date  . Diverticulosis   . Enlarged prostate   . Hypertension   . Major depression, chronic     Patient Active Problem List   Diagnosis Date Noted  . Malnutrition of moderate degree 09/13/2017  . Hypertension 09/10/2017  . AKI (acute kidney injury) (HCC) 09/10/2017  . Chest pain 09/10/2017  . Hyponatremia 09/10/2017  . Hypotension 09/10/2017  . Near syncope 09/10/2017  . Neck pain 09/10/2017    History reviewed. No pertinent surgical history.     Home Medications    Prior to Admission medications   Medication Sig Start Date End Date Taking? Authorizing Provider  amLODipine (NORVASC) 5 MG tablet Take 1 tablet (5 mg total) by mouth daily. 10/08/17   Donnetta Hutchingook, Brian, MD  aspirin 325 MG tablet Take 650 mg by mouth every 4 (four) hours as needed for mild pain.    [provider]  diphenhydrAMINE (BENADRYL) 25 MG tablet Take 25 mg by mouth every 6 (six) hours as needed for sleep.    [provider]  lisinopril-hydrochlorothiazide (PRINZIDE,ZESTORETIC) 20-12.5 MG tablet Take 1 tablet by mouth daily. Patient not taking: Reported on 10/08/2017 08/11/17   Elson AreasSofia, Leslie K, PA-C  Multiple Vitamin (MULTIVITAMIN WITH MINERALS) TABS tablet Take 1 tablet by mouth daily. Patient not taking: Reported on 10/08/2017 09/14/17   Kendell BaneShahmehdi, Seyed A, MD  potassium chloride SA (K-DUR,KLOR-CON) 20 MEQ tablet Take 2 tablets (40 mEq total) by mouth daily. 08/11/17   Elson AreasSofia, Leslie K, PA-C    Family History No family history on file.  Social History Social History   Tobacco Use  . Smoking status: Current Every Day Smoker    Types: Cigarettes  . Smokeless tobacco: Never Used  Substance Use Topics  . Alcohol use: Yes  . Drug use: No     Allergies   Patient has no known allergies.   Review of Systems Review of Systems  Constitutional:       Per HPI, otherwise negative  HENT:       Per HPI, otherwise negative  Respiratory:       Per HPI, otherwise negative  Cardiovascular:       Per HPI, otherwise negative  Gastrointestinal: Negative for vomiting.  Endocrine:       Negative aside from HPI  Genitourinary:       Neg aside from HPI   Musculoskeletal:       Per HPI,  otherwise negative  Skin: Negative.   Neurological: Positive for weakness and headaches. Negative for syncope.     Physical Exam Updated Vital Signs BP (!) 149/94 (BP Location: Right Arm)   Pulse 73   Temp 98.5 F (36.9 C) (Oral)   Resp 18   Ht 5\' 10"  (1.778 m)   Wt 68 kg (150 lb)   SpO2 100%   BMI 21.52 kg/m   Physical Exam  Constitutional: He is oriented to person, place, and time. He appears well-developed. No distress.  HENT:  Head: Normocephalic and atraumatic.  Eyes: Conjunctivae and EOM are normal.  Cardiovascular: Normal rate and regular rhythm.  Pulmonary/Chest:  Effort normal. No stridor. No respiratory distress.  Abdominal: He exhibits no distension.  Musculoskeletal: He exhibits no edema.  No deformities, patient moves all extremities spontaneously.  Neurological: He is alert and oriented to person, place, and time.  Skin: Skin is warm and dry.  Psychiatric: He has a normal mood and affect.  Patient demonstrates some insight into the fact that his homelessness, lack of access to care, lack of access to medications is contributing to his episodic pain.  Nursing note and vitals reviewed.    ED Treatments / Results  Labs (all labs ordered are listed, but only abnormal results are displayed) Labs Reviewed  CBC - Abnormal; Notable for the following components:      Result Value   WBC 3.7 (*)    RBC 3.58 (*)    Hemoglobin 12.5 (*)    HCT 36.1 (*)    MCV 100.8 (*)    MCH 34.9 (*)    Platelets 91 (*)    All other components within normal limits  COMPREHENSIVE METABOLIC PANEL - Abnormal; Notable for the following components:   Sodium 134 (*)    Potassium 3.3 (*)    Chloride 95 (*)    BUN <5 (*)    Calcium 8.8 (*)    AST 270 (*)    ALT 119 (*)    Total Bilirubin 1.5 (*)    All other components within normal limits  ETHANOL - Abnormal; Notable for the following components:   Alcohol, Ethyl (B) 212 (*)    All other components within normal limits  I-STAT CHEM 8, ED - Abnormal; Notable for the following components:   Sodium 134 (*)    Potassium 3.4 (*)    Chloride 96 (*)    BUN 3 (*)    Creatinine, Ser 1.40 (*)    Calcium, Ion 1.03 (*)    All other components within normal limits  PROTIME-INR  APTT  DIFFERENTIAL  RAPID URINE DRUG SCREEN, HOSP PERFORMED  I-STAT TROPONIN, ED    EKG  EKG Interpretation  Date/Time:  Wednesday October 12 2017 22:58:16 EDT Ventricular Rate:  78 PR Interval:  142 QRS Duration: 92 QT Interval:  382 QTC Calculation: 435 R Axis:   85 Text Interpretation:  Normal sinus rhythm Normal ECG Normal ECG  Confirmed by Gerhard Munch [phone]) on 10/13/2017 7:04:10 AM       Radiology Ct Head Wo Contrast  Result Date: 10/13/2017 CLINICAL DATA:  Headache.  Cramping in hands. EXAM: CT HEAD WITHOUT CONTRAST TECHNIQUE: Contiguous axial images were obtained from the base of the skull through the vertex without intravenous contrast. COMPARISON:  None. FINDINGS: Brain: Age related atrophy. Mild chronic small vessel ischemia. No intracranial hemorrhage, mass effect, or midline shift. No hydrocephalus. The basilar cisterns are patent. No evidence of territorial infarct or acute ischemia. No  extra-axial or intracranial fluid collection. Vascular: No hyperdense vessel or unexpected calcification. Skull: No fracture or focal lesion. Sinuses/Orbits: Paranasal sinuses and mastoid air cells are clear. The visualized orbits are unremarkable. Other: None. IMPRESSION: 1.  No acute intracranial abnormality. 2. Age related atrophy and chronic small vessel ischemia. Electronically Signed   By: Rubye Oaks M.D.   On: 10/13/2017 03:59    Procedures Procedures (including critical care time)  Medications Ordered in ED Medications - No data to display   Initial Impression / Assessment and Plan / ED Course  I have reviewed the triage vital signs and the nursing notes.  Pertinent labs & imaging results that were available during my care of the patient were reviewed by me and considered in my medical decision making (see chart for details).     7:28 AM On repeat exam the patient is in similar condition, labs most notable for elevated alcohol level, otherwise generally reassuring, consistent with prior studies. No evidence for ongoing coronary ischemia, with reassuring troponin, EKG. No evidence for pneumonia, bacteremia, sepsis. Patient has been seen multiple times, and given today's reassuring findings, there is low suspicion for acute new pathology, some suspicion for the fact that it is raining outside  contributing to his presentation. However, the patient is homeless, has multiple medical issues, including hypertension, poor access to medication and healthcare, no clear living situation. Patient will have his case discussed with social work, prior to discharge.  Final Clinical Impressions(s) / ED Diagnoses  Atypical chest pain Homelessness   Gerhard Munch, MD 10/13/17 [phone]

## 2018-07-14 ENCOUNTER — Encounter (HOSPITAL_COMMUNITY): Payer: Self-pay | Admitting: Emergency Medicine

## 2018-07-14 ENCOUNTER — Emergency Department (HOSPITAL_COMMUNITY): Payer: Medicaid Other

## 2018-07-14 ENCOUNTER — Emergency Department (HOSPITAL_COMMUNITY)
Admission: EM | Admit: 2018-07-14 | Discharge: 2018-07-14 | Disposition: A | Discharge status: Home / Self Care | Payer: Medicaid Other | Attending: Emergency Medicine | Admitting: Emergency Medicine

## 2018-07-14 ENCOUNTER — Other Ambulatory Visit: Payer: Self-pay

## 2018-07-14 DIAGNOSIS — F10920 Alcohol use, unspecified with intoxication, uncomplicated: Secondary | ICD-10-CM | POA: Diagnosis not present

## 2018-07-14 DIAGNOSIS — R899 Unspecified abnormal finding in specimens from other organs, systems and tissues: Secondary | ICD-10-CM

## 2018-07-14 DIAGNOSIS — F10929 Alcohol use, unspecified with intoxication, unspecified: Secondary | ICD-10-CM | POA: Diagnosis present

## 2018-07-14 DIAGNOSIS — I10 Essential (primary) hypertension: Secondary | ICD-10-CM | POA: Insufficient documentation

## 2018-07-14 DIAGNOSIS — Z79899 Other long term (current) drug therapy: Secondary | ICD-10-CM | POA: Diagnosis not present

## 2018-07-14 DIAGNOSIS — F17210 Nicotine dependence, cigarettes, uncomplicated: Secondary | ICD-10-CM | POA: Diagnosis not present

## 2018-07-14 LAB — CBC WITH DIFFERENTIAL/PLATELET
Abs Immature Granulocytes: 0.01 10*3/uL (ref 0.00–0.07)
Basophils Absolute: 0 10*3/uL (ref 0.0–0.1)
Basophils Relative: 2 %
EOS ABS: 0 10*3/uL (ref 0.0–0.5)
Eosinophils Relative: 1 %
HEMATOCRIT: 39.1 % (ref 39.0–52.0)
HEMOGLOBIN: 13.2 g/dL (ref 13.0–17.0)
IMMATURE GRANULOCYTES: 0 %
LYMPHS ABS: 1.3 10*3/uL (ref 0.7–4.0)
LYMPHS PCT: 48 %
MCH: 33.9 pg (ref 26.0–34.0)
MCHC: 33.8 g/dL (ref 30.0–36.0)
MCV: 100.5 fL — AB (ref 80.0–100.0)
MONOS PCT: 13 %
Monocytes Absolute: 0.4 10*3/uL (ref 0.1–1.0)
Neutro Abs: 1 10*3/uL — ABNORMAL LOW (ref 1.7–7.7)
Neutrophils Relative %: 36 %
Platelets: 93 10*3/uL — ABNORMAL LOW (ref 150–400)
RBC: 3.89 MIL/uL — ABNORMAL LOW (ref 4.22–5.81)
RDW: 13.1 % (ref 11.5–15.5)
WBC: 2.7 10*3/uL — ABNORMAL LOW (ref 4.0–10.5)
nRBC: 0 % (ref 0.0–0.2)

## 2018-07-14 LAB — COMPREHENSIVE METABOLIC PANEL
ALK PHOS: 71 U/L (ref 38–126)
ALT: 61 U/L — AB (ref 0–44)
AST: 157 U/L — AB (ref 15–41)
Albumin: 4.4 g/dL (ref 3.5–5.0)
Anion gap: 17 — ABNORMAL HIGH (ref 5–15)
BILIRUBIN TOTAL: 1 mg/dL (ref 0.3–1.2)
BUN: 8 mg/dL (ref 8–23)
CALCIUM: 9.3 mg/dL (ref 8.9–10.3)
CO2: 24 mmol/L (ref 22–32)
CREATININE: 0.98 mg/dL (ref 0.61–1.24)
Chloride: 96 mmol/L — ABNORMAL LOW (ref 98–111)
GFR calc Af Amer: >60 mL/min (ref >60)
GFR calc non Af Amer: >60 mL/min (ref >60)
Glucose, Bld: 109 mg/dL — ABNORMAL HIGH (ref 70–99)
Potassium: 4.3 mmol/L (ref 3.5–5.1)
Sodium: 137 mmol/L (ref 135–145)
TOTAL PROTEIN: 8 g/dL (ref 6.5–8.1)

## 2018-07-14 LAB — ETHANOL: Alcohol, Ethyl (B): 427 mg/dL (ref <10)

## 2018-07-14 NOTE — ED Notes (Signed)
ED Provider at bedside. 

## 2018-07-14 NOTE — ED Notes (Signed)
Patient transported to X-ray 

## 2018-07-14 NOTE — ED Provider Notes (Signed)
[facility]   CSN: [id number] Arrival date & time: 07/14/18  0358     History   Chief Complaint Chief Complaint  Patient presents with  . Cough  . Nasal Congestion    HPI Gillermina PhyWilliam Dimon is a 62 y.o. male.  Patient presents to the emergency department with a chief complaint of cough and nasal congestion.  Also reports alcohol abuse, and would like help with this.  States that he drinks daily.  He is intoxicated now.  Denies SI or HI.  Denies any hallucinations.  Denies being in any pain.  Refuses to state how much she drank today.  The history is provided by the patient. No language interpreter was used.    Past Medical History:  Diagnosis Date  . Diverticulosis   . Enlarged prostate   . Hypertension   . Major depression, chronic     Patient Active Problem List   Diagnosis Date Noted  . Malnutrition of moderate degree 09/13/2017  . Hypertension 09/10/2017  . AKI (acute kidney injury) (HCC) 09/10/2017  . Chest pain 09/10/2017  . Hyponatremia 09/10/2017  . Hypotension 09/10/2017  . Near syncope 09/10/2017  . Neck pain 09/10/2017    History reviewed. No pertinent surgical history.      Home Medications    Prior to Admission medications   Medication Sig Start Date End Date Taking? Authorizing Provider  amLODipine (NORVASC) 5 MG tablet Take 1 tablet (5 mg total) by mouth daily. 10/08/17   Donnetta Hutchingook, Brian, MD  aspirin 325 MG tablet Take 650 mg by mouth every 4 (four) hours as needed for mild pain.    [provider]  diphenhydrAMINE (BENADRYL) 25 MG tablet Take 25 mg by mouth every 6 (six) hours as needed for sleep.    [provider]  lisinopril-hydrochlorothiazide (PRINZIDE,ZESTORETIC) 20-12.5 MG tablet Take 1 tablet by mouth daily. Patient not taking: Reported on 10/08/2017 08/11/17   Elson AreasSofia, Leslie K, PA-C  Multiple Vitamin (MULTIVITAMIN WITH MINERALS) TABS tablet Take 1 tablet by mouth  daily. Patient not taking: Reported on 10/08/2017 09/14/17   Kendell BaneShahmehdi, Seyed A, MD  potassium chloride SA (K-DUR,KLOR-CON) 20 MEQ tablet Take 2 tablets (40 mEq total) by mouth daily. 08/11/17   Elson AreasSofia, Leslie K, PA-C    Family History History reviewed. No pertinent family history.  Social History Social History   Tobacco Use  . Smoking status: Current Every Day Smoker    Types: Cigarettes  . Smokeless tobacco: Never Used  Substance Use Topics  . Alcohol use: Yes  . Drug use: No     Allergies   Patient has no known allergies.   Review of Systems Review of Systems  All other systems reviewed and are negative.    Physical Exam Updated Vital Signs BP (!) 165/95   Pulse 79   Temp (!) 97.4 F (36.3 C) (Oral)   Resp 16   Ht 5\' 11"  (1.803 m)   Wt 61.2 kg   SpO2 100%   BMI 18.83 kg/m   Physical Exam Vitals signs and nursing note reviewed.  Constitutional:      Appearance: He is well-developed.  HENT:     Head: Normocephalic and atraumatic.  Eyes:     General: No scleral icterus.       Right eye: No discharge.        Left eye: No discharge.     Conjunctiva/sclera: Conjunctivae normal.     Pupils: Pupils are equal, round, and  reactive to light.  Neck:     Musculoskeletal: Normal range of motion and neck supple.     Vascular: No JVD.  Cardiovascular:     Rate and Rhythm: Normal rate and regular rhythm.     Heart sounds: Normal heart sounds. No murmur. No friction rub. No gallop.   Pulmonary:     Effort: Pulmonary effort is normal. No respiratory distress.     Breath sounds: Normal breath sounds. No wheezing or rales.  Chest:     Chest wall: No tenderness.  Abdominal:     General: There is no distension.     Palpations: Abdomen is soft. There is no mass.     Tenderness: There is no abdominal tenderness. There is no guarding or rebound.  Musculoskeletal: Normal range of motion.        General: No tenderness.  Skin:    General: Skin is warm and dry.   Neurological:     Mental Status: He is alert and oriented to person, place, and time.  Psychiatric:        Behavior: Behavior normal.        Thought Content: Thought content normal.        Judgment: Judgment normal.      ED Treatments / Results  Labs (all labs ordered are listed, but only abnormal results are displayed) Labs Reviewed  CBC WITH DIFFERENTIAL/PLATELET  COMPREHENSIVE METABOLIC PANEL  ETHANOL    EKG None  Radiology No results found.  Procedures Procedures (including critical care time)  Medications Ordered in ED Medications - No data to display   Initial Impression / Assessment and Plan / ED Course  I have reviewed the triage vital signs and the nursing notes.  Pertinent labs & imaging results that were available during my care of the patient were reviewed by me and considered in my medical decision making (see chart for details).     Patient presents to the emergency department with nasal congestion, alcohol intoxication, and reported abnormal lab test.  He states that his white blood cell count was low.  This concerned him.  He wanted to be reevaluated tonight.  His white blood cell count is 2.7, but he has been low in the past.  Do not feel that any additional emergent work-up is indicated.  He is in no acute distress.  He stays with his girlfriend, and can go home with her.  She states that she will look after him.  Final Clinical Impressions(s) / ED Diagnoses   Final diagnoses:  Abnormal laboratory test  Alcoholic intoxication without complication [facility](HCC)    ED Discharge Orders    None       Roxy HorsemanBrowning, Jameia Makris, PA-C 07/14/18 0617    Ward, Layla MawKristen N, DO 07/14/18 [phone]

## 2018-07-14 NOTE — ED Triage Notes (Signed)
Pt c/o nasal congestion, watery eyes, dry cough. Denies pain. Admits to ETOH use, but avoiding answer as to how much he's had to drink tonight or regularly. Presents with mild slurred, slow speech. Ambulatory but slow movements.

## 2018-07-16 ENCOUNTER — Emergency Department (HOSPITAL_COMMUNITY)
Admission: EM | Admit: 2018-07-16 | Discharge: 2018-07-16 | Disposition: A | Discharge status: Home / Self Care | Payer: Medicaid Other | Attending: Emergency Medicine | Admitting: Emergency Medicine

## 2018-07-16 ENCOUNTER — Encounter (HOSPITAL_COMMUNITY): Payer: Self-pay

## 2018-07-16 DIAGNOSIS — F17210 Nicotine dependence, cigarettes, uncomplicated: Secondary | ICD-10-CM | POA: Insufficient documentation

## 2018-07-16 DIAGNOSIS — R51 Headache: Secondary | ICD-10-CM | POA: Diagnosis not present

## 2018-07-16 DIAGNOSIS — Y907 Blood alcohol level of 200-239 mg/100 ml: Secondary | ICD-10-CM | POA: Diagnosis not present

## 2018-07-16 DIAGNOSIS — I10 Essential (primary) hypertension: Secondary | ICD-10-CM | POA: Diagnosis not present

## 2018-07-16 DIAGNOSIS — H9312 Tinnitus, left ear: Secondary | ICD-10-CM | POA: Insufficient documentation

## 2018-07-16 DIAGNOSIS — F1010 Alcohol abuse, uncomplicated: Secondary | ICD-10-CM

## 2018-07-16 DIAGNOSIS — F10120 Alcohol abuse with intoxication, uncomplicated: Secondary | ICD-10-CM | POA: Insufficient documentation

## 2018-07-16 LAB — CBC WITH DIFFERENTIAL/PLATELET
Abs Immature Granulocytes: 0 10*3/uL (ref 0.00–0.07)
BASOS PCT: 1 %
Basophils Absolute: 0 10*3/uL (ref 0.0–0.1)
EOS ABS: 0 10*3/uL (ref 0.0–0.5)
EOS PCT: 1 %
HEMATOCRIT: 35.6 % — AB (ref 39.0–52.0)
Hemoglobin: 12 g/dL — ABNORMAL LOW (ref 13.0–17.0)
Immature Granulocytes: 0 %
LYMPHS ABS: 1.2 10*3/uL (ref 0.7–4.0)
Lymphocytes Relative: 31 %
MCH: 33.5 pg (ref 26.0–34.0)
MCHC: 33.7 g/dL (ref 30.0–36.0)
MCV: 99.4 fL (ref 80.0–100.0)
MONOS PCT: 14 %
Monocytes Absolute: 0.5 10*3/uL (ref 0.1–1.0)
Neutro Abs: 2 10*3/uL (ref 1.7–7.7)
Neutrophils Relative %: 53 %
Platelets: 95 10*3/uL — ABNORMAL LOW (ref 150–400)
RBC: 3.58 MIL/uL — ABNORMAL LOW (ref 4.22–5.81)
RDW: 12.9 % (ref 11.5–15.5)
WBC: 3.7 10*3/uL — ABNORMAL LOW (ref 4.0–10.5)
nRBC: 0 % (ref 0.0–0.2)

## 2018-07-16 LAB — I-STAT CHEM 8, ED
BUN: 11 mg/dL (ref 8–23)
CREATININE: 1.3 mg/dL — AB (ref 0.61–1.24)
Calcium, Ion: 1.02 mmol/L — ABNORMAL LOW (ref 1.15–1.40)
Chloride: 96 mmol/L — ABNORMAL LOW (ref 98–111)
Glucose, Bld: 94 mg/dL (ref 70–99)
HCT: 38 % — ABNORMAL LOW (ref 39.0–52.0)
Hemoglobin: 12.9 g/dL — ABNORMAL LOW (ref 13.0–17.0)
Potassium: 3.8 mmol/L (ref 3.5–5.1)
Sodium: 133 mmol/L — ABNORMAL LOW (ref 135–145)
TCO2: 27 mmol/L (ref 22–32)

## 2018-07-16 LAB — BASIC METABOLIC PANEL WITH GFR
Anion gap: 18 — ABNORMAL HIGH (ref 5–15)
BUN: 11 mg/dL (ref 8–23)
CO2: 24 mmol/L (ref 22–32)
Calcium: 8.8 mg/dL — ABNORMAL LOW (ref 8.9–10.3)
Chloride: 86 mmol/L — ABNORMAL LOW (ref 98–111)
Creatinine, Ser: 0.99 mg/dL (ref 0.61–1.24)
GFR calc Af Amer: >60 mL/min
GFR calc non Af Amer: >60 mL/min
Glucose, Bld: 89 mg/dL (ref 70–99)
Potassium: 3.6 mmol/L (ref 3.5–5.1)
Sodium: 128 mmol/L — ABNORMAL LOW (ref 135–145)

## 2018-07-16 LAB — ETHANOL: Alcohol, Ethyl (B): 213 mg/dL — ABNORMAL HIGH (ref <10)

## 2018-07-16 MED ORDER — SODIUM CHLORIDE 0.9 % IV SOLN
INTRAVENOUS | Status: DC
  Administered 2018-07-16: 19:00:00 via INTRAVENOUS

## 2018-07-16 MED ORDER — SODIUM CHLORIDE 0.9 % IV SOLN
Freq: Once | INTRAVENOUS | Status: AC
  Administered 2018-07-16: 20:00:00 via INTRAVENOUS

## 2018-07-16 MED ORDER — ACETAMINOPHEN 325 MG PO TABS: 325.0000 mg | ORAL_TABLET | Freq: Once | ORAL | Status: DC

## 2018-07-16 MED ORDER — ACETAMINOPHEN 325 MG PO TABS
650.0000 mg | ORAL_TABLET | Freq: Once | ORAL | Status: AC
  Administered 2018-07-16: 650 mg via ORAL
  Filled 2018-07-16: qty 2

## 2018-07-16 NOTE — ED Notes (Signed)
Patient ambulated to bathroom with stead gait at this time. Denies dizziness or blurry vision.

## 2018-07-16 NOTE — ED Notes (Signed)
Patient ambulated to the bathroom. Steady gait noted at this time.  

## 2018-07-16 NOTE — ED Provider Notes (Signed)
Complains of tinnitus of right ear for 1 week.  He also reports nasal congestion and drainage in the back of his throat for approximately a year and left-sided parietal headaches intermittently for a year which occur "when I get worried".  Patient admits to drinking alcohol earlier today.  He reportedly fell today but did not strike his head.  He also reports that he had diarrhea earlier in the week.  Has had no diarrhea for the past 2 days.  Denies abdominal pain.  He is treated himself with Zyrtec which does help drainage and throat and with nasal congestion.  On exam he is alert Glasgow Coma Score 15 ENT exam normocephalic atraumatic.  Bilateral tympanic membranes are normal.  Ear canals normal external ears normal.  Neck supple lungs clear to auscultation heart regular rate and rhythm abdomen nondistended nontender neurologic Glasgow Coma Score 15 cranial nerves II through XII intact gait normal Romberg normal pronator drift normal finger-to-nose normal.   Larry Byrd, Larry Enck, MD 07/16/18 [phone]

## 2018-07-16 NOTE — Discharge Instructions (Addendum)
Follow up with your doctor. Take your blood pressure medication as directed.

## 2018-07-16 NOTE — ED Notes (Signed)
Patient verbalizes understanding of medications and discharge instructions. No further questions at this time. VSS and patient ambulatory at discharge.   

## 2018-07-16 NOTE — ED Triage Notes (Signed)
Patient c/o ringing in RIGHT ear x 1 week. Patient states he was seen here a few days ago but forgot to mention it. Patient also c/o headache, watery eyes and runny nose. Denies chest pain, SOB, abdominal pain, dysuria, and fever.

## 2018-07-16 NOTE — ED Provider Notes (Signed)
[facility] Provider Note   CSN: [id number] Arrival date & time: 07/16/18  1816     History   Chief Complaint Chief Complaint  Patient presents with  . Tinnitus    HPI Larry Byrd is a 62 y.o. male with hx of alcohol abuse, HTN and depression who presents to the ED with multiple complaints. Patient reports having high blood pressure and is in the process of moving and his medication got sent with other stuff so he has not had his medication in 2 days. Patient also c/o ringing in his ears, ear pain and headache on the left side. Patient reports headaches off and on for the past year and this is similar Patient reports diarrhea a few days ago but none now.   The history is provided by the patient. No language interpreter was used.    Past Medical History:  Diagnosis Date  . Diverticulosis   . Enlarged prostate   . Hypertension   . Major depression, chronic     Patient Active Problem List   Diagnosis Date Noted  . Malnutrition of moderate degree 09/13/2017  . Hypertension 09/10/2017  . AKI (acute kidney injury) (HCC) 09/10/2017  . Chest pain 09/10/2017  . Hyponatremia 09/10/2017  . Hypotension 09/10/2017  . Near syncope 09/10/2017  . Neck pain 09/10/2017    History reviewed. No pertinent surgical history.      Home Medications    Prior to Admission medications   Medication Sig Start Date End Date Taking? Authorizing Provider  amLODipine (NORVASC) 5 MG tablet Take 1 tablet (5 mg total) by mouth daily. 10/08/17   Donnetta Hutchingook, Brian, MD  aspirin 325 MG tablet Take 650 mg by mouth every 4 (four) hours as needed for mild pain.    [provider]  diphenhydrAMINE (BENADRYL) 25 MG tablet Take 25 mg by mouth every 6 (six) hours as needed for sleep.    [provider]  lisinopril-hydrochlorothiazide (PRINZIDE,ZESTORETIC) 20-12.5 MG tablet Take 1 tablet by mouth daily. Patient not taking: Reported on 10/08/2017 08/11/17    Elson AreasSofia, Leslie K, PA-C  Multiple Vitamin (MULTIVITAMIN WITH MINERALS) TABS tablet Take 1 tablet by mouth daily. Patient not taking: Reported on 10/08/2017 09/14/17   Kendell BaneShahmehdi, Seyed A, MD  potassium chloride SA (K-DUR,KLOR-CON) 20 MEQ tablet Take 2 tablets (40 mEq total) by mouth daily. 08/11/17   Elson AreasSofia, Leslie K, PA-C    Family History No family history on file.  Social History Social History   Tobacco Use  . Smoking status: Current Every Day Smoker    Types: Cigarettes  . Smokeless tobacco: Never Used  Substance Use Topics  . Alcohol use: Yes  . Drug use: No     Allergies   Patient has no known allergies.   Review of Systems Review of Systems  Constitutional: Negative for chills and fever.  HENT: Positive for ear pain and tinnitus.   Respiratory: Negative for cough and shortness of breath.   Cardiovascular: Negative for chest pain.  Gastrointestinal: Positive for diarrhea. Negative for abdominal pain and vomiting.  Musculoskeletal: Positive for arthralgias.       Left leg pain  Skin: Negative for rash.  Neurological: Positive for headaches.  Psychiatric/Behavioral: Negative for confusion.     Physical Exam Updated Vital Signs BP (!) 147/94 (BP Location: Right Arm)   Pulse 91   Temp 98.4 F (36.9 C) (Oral)   Resp 18   Ht 5\' 10"  (1.778 m)   Wt 61.2 kg  SpO2 99%   BMI 19.37 kg/m   Physical Exam Vitals signs and nursing note reviewed.  Constitutional:      General: He is not in acute distress.    Appearance: He is well-developed.  HENT:     Head: Normocephalic.     Right Ear: Tympanic membrane normal.     Left Ear: Tympanic membrane normal.     Nose: Nose normal.  Neck:     Musculoskeletal: Neck supple.  Cardiovascular:     Rate and Rhythm: Normal rate.  Pulmonary:     Effort: Pulmonary effort is normal.  Abdominal:     Palpations: Abdomen is soft.     Tenderness: There is no abdominal tenderness.  Musculoskeletal: Normal range of motion.  Skin:     General: Skin is warm and dry.  Neurological:     Mental Status: He is alert and oriented to person, place, and time.     Motor: Motor function is intact.     Coordination: Romberg sign negative.     Gait: Gait normal.  Psychiatric:        Mood and Affect: Mood normal.    Dr. Ethelda ChickJacubowitz also saw the patient and did a neuro exam that was normal.   ED Treatments / Results  Labs (all labs ordered are listed, but only abnormal results are displayed) Labs Reviewed  CBC WITH DIFFERENTIAL/PLATELET - Abnormal; Notable for the following components:      Result Value   WBC 3.7 (*)    RBC 3.58 (*)    Hemoglobin 12.0 (*)    HCT 35.6 (*)    Platelets 95 (*)    All other components within normal limits  BASIC METABOLIC PANEL - Abnormal; Notable for the following components:   Sodium 128 (*)    Chloride 86 (*)    Calcium 8.8 (*)    Anion gap 18 (*)    All other components within normal limits  ETHANOL - Abnormal; Notable for the following components:   Alcohol, Ethyl (B) 213 (*)    All other components within normal limits  I-STAT CHEM 8, ED - Abnormal; Notable for the following components:   Sodium 133 (*)    Chloride 96 (*)    Creatinine, Ser 1.30 (*)    Calcium, Ion 1.02 (*)    Hemoglobin 12.9 (*)    HCT 38.0 (*)    All other components within normal limits  I-STAT CHEM 8, ED   Radiology No results found.  Procedures Procedures (including critical care time)  Medications Ordered in ED Medications  0.9 %  sodium chloride infusion ( Intravenous Stopped 07/16/18 2041)  0.9 %  sodium chloride infusion ( Intravenous Stopped 07/16/18 1938)  acetaminophen (TYLENOL) tablet 650 mg (650 mg Oral Given 07/16/18 2125)   After IV hydration patient reports feeling better and ready to go home. Repeat labs reviewed with Dr. Jeraldine LootsLockwood and patient is stable for d/c. Discussed with the patient need for f/u with PCP or return for worsening symptoms.   Initial Impression / Assessment and Plan  / ED Course  I have reviewed the triage vital signs and the nursing notes.  Final Clinical Impressions(s) / ED Diagnoses   Final diagnoses:  Tinnitus of left ear  Alcohol abuse    ED Discharge Orders    None       Kerrie Buffaloeese, Welford Christmas NewcastleM, NP 07/16/18 2256    Gerhard MunchLockwood, Robert, MD 07/16/18 2326

## 2019-03-24 ENCOUNTER — Encounter (HOSPITAL_COMMUNITY): Payer: Self-pay

## 2019-03-24 ENCOUNTER — Emergency Department (HOSPITAL_COMMUNITY)
Admission: EM | Admit: 2019-03-24 | Discharge: 2019-03-24 | Disposition: A | Discharge status: Home / Self Care | Payer: Medicaid Other | Attending: Emergency Medicine | Admitting: Emergency Medicine

## 2019-03-24 ENCOUNTER — Emergency Department (HOSPITAL_COMMUNITY)
Admission: EM | Admit: 2019-03-24 | Discharge: 2019-03-24 | Disposition: A | Discharge status: Home / Self Care | Payer: Medicaid Other | Source: Home / Self Care | Attending: Emergency Medicine | Admitting: Emergency Medicine

## 2019-03-24 ENCOUNTER — Other Ambulatory Visit: Payer: Self-pay

## 2019-03-24 ENCOUNTER — Emergency Department (HOSPITAL_COMMUNITY): Payer: Medicaid Other

## 2019-03-24 DIAGNOSIS — S0083XA Contusion of other part of head, initial encounter: Secondary | ICD-10-CM | POA: Insufficient documentation

## 2019-03-24 DIAGNOSIS — Y999 Unspecified external cause status: Secondary | ICD-10-CM | POA: Diagnosis not present

## 2019-03-24 DIAGNOSIS — S199XXA Unspecified injury of neck, initial encounter: Secondary | ICD-10-CM | POA: Diagnosis not present

## 2019-03-24 DIAGNOSIS — H5712 Ocular pain, left eye: Secondary | ICD-10-CM | POA: Insufficient documentation

## 2019-03-24 DIAGNOSIS — F10929 Alcohol use, unspecified with intoxication, unspecified: Secondary | ICD-10-CM | POA: Diagnosis not present

## 2019-03-24 DIAGNOSIS — Y929 Unspecified place or not applicable: Secondary | ICD-10-CM | POA: Diagnosis not present

## 2019-03-24 DIAGNOSIS — F17210 Nicotine dependence, cigarettes, uncomplicated: Secondary | ICD-10-CM | POA: Insufficient documentation

## 2019-03-24 DIAGNOSIS — Z7982 Long term (current) use of aspirin: Secondary | ICD-10-CM | POA: Insufficient documentation

## 2019-03-24 DIAGNOSIS — I10 Essential (primary) hypertension: Secondary | ICD-10-CM | POA: Insufficient documentation

## 2019-03-24 DIAGNOSIS — Y939 Activity, unspecified: Secondary | ICD-10-CM | POA: Insufficient documentation

## 2019-03-24 DIAGNOSIS — Z79899 Other long term (current) drug therapy: Secondary | ICD-10-CM | POA: Insufficient documentation

## 2019-03-24 DIAGNOSIS — S0990XA Unspecified injury of head, initial encounter: Secondary | ICD-10-CM | POA: Diagnosis present

## 2019-03-24 HISTORY — DX: Alcohol dependence, uncomplicated: F10.20

## 2019-03-24 MED ORDER — KETOROLAC TROMETHAMINE 15 MG/ML IJ SOLN
15.0000 mg | Freq: Once | INTRAMUSCULAR | Status: AC
  Administered 2019-03-24: 21:00:00 15 mg via INTRAMUSCULAR
  Filled 2019-03-24: qty 1

## 2019-03-24 NOTE — ED Notes (Signed)
Pt stated "I can't walk and I can't see". Pt ambulatory in hallway without assistance to [place].

## 2019-03-24 NOTE — ED Notes (Signed)
Care handoff to Katie, RN
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
## 2019-03-24 NOTE — ED Notes (Signed)
Patient ambulated to restroom without complication or assistance. 
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
## 2019-03-24 NOTE — Discharge Instructions (Addendum)
Follow-up with outpatient resources for alcohol use.  Return to ER as needed for worsening or concerning symptoms.  Apply ice for pain and swelling your face.
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
## 2019-03-24 NOTE — ED Notes (Signed)
Pt was not in [facility] when I went to discharge him.

## 2019-03-24 NOTE — Patient Outreach (Signed)
[facility] made several attempts to speak with Pt and to better assist Pt. [facility] asked Pt if he wanted to receive assistance from [facility] for services and Pt stated that he just wants to sleep. [facility] issued resources contact information for Pt to follow up when Pt is ready for services.

## 2019-03-24 NOTE — ED Notes (Signed)
Pt ambulatory to restroom with no difficulty 

## 2019-03-24 NOTE — ED Notes (Signed)
Patient transported to CT 
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
## 2019-03-29 ENCOUNTER — Emergency Department (HOSPITAL_COMMUNITY): Payer: Medicaid Other

## 2019-03-29 ENCOUNTER — Other Ambulatory Visit: Payer: Self-pay

## 2019-03-29 ENCOUNTER — Emergency Department (HOSPITAL_COMMUNITY)
Admission: EM | Admit: 2019-03-29 | Discharge: 2019-03-29 | Disposition: A | Discharge status: Home / Self Care | Payer: Medicaid Other | Attending: Emergency Medicine | Admitting: Emergency Medicine

## 2019-03-29 DIAGNOSIS — R58 Hemorrhage, not elsewhere classified: Secondary | ICD-10-CM | POA: Insufficient documentation

## 2019-03-29 DIAGNOSIS — W19XXXA Unspecified fall, initial encounter: Secondary | ICD-10-CM | POA: Insufficient documentation

## 2019-03-29 DIAGNOSIS — S2231XA Fracture of one rib, right side, initial encounter for closed fracture: Secondary | ICD-10-CM | POA: Insufficient documentation

## 2019-03-29 DIAGNOSIS — Y939 Activity, unspecified: Secondary | ICD-10-CM | POA: Diagnosis not present

## 2019-03-29 DIAGNOSIS — F10920 Alcohol use, unspecified with intoxication, uncomplicated: Secondary | ICD-10-CM | POA: Insufficient documentation

## 2019-03-29 DIAGNOSIS — Y929 Unspecified place or not applicable: Secondary | ICD-10-CM | POA: Diagnosis not present

## 2019-03-29 DIAGNOSIS — Z79899 Other long term (current) drug therapy: Secondary | ICD-10-CM | POA: Insufficient documentation

## 2019-03-29 DIAGNOSIS — S098XXA Other specified injuries of head, initial encounter: Secondary | ICD-10-CM | POA: Insufficient documentation

## 2019-03-29 DIAGNOSIS — Y999 Unspecified external cause status: Secondary | ICD-10-CM | POA: Insufficient documentation

## 2019-03-29 DIAGNOSIS — F17210 Nicotine dependence, cigarettes, uncomplicated: Secondary | ICD-10-CM | POA: Diagnosis not present

## 2019-03-29 DIAGNOSIS — H1132 Conjunctival hemorrhage, left eye: Secondary | ICD-10-CM | POA: Diagnosis not present

## 2019-03-29 DIAGNOSIS — I10 Essential (primary) hypertension: Secondary | ICD-10-CM | POA: Insufficient documentation

## 2019-03-29 DIAGNOSIS — H5712 Ocular pain, left eye: Secondary | ICD-10-CM | POA: Diagnosis present

## 2019-03-29 LAB — ETHANOL: Alcohol, Ethyl (B): 391 mg/dL (ref <10)

## 2019-03-29 MED ORDER — ACETAMINOPHEN 325 MG PO TABS
650.0000 mg | ORAL_TABLET | Freq: Once | ORAL | Status: AC
  Administered 2019-03-29: 650 mg via ORAL
  Filled 2019-03-29: qty 2

## 2019-03-29 MED ORDER — SODIUM CHLORIDE 0.9 % IV BOLUS
1000.0000 mL | Freq: Once | INTRAVENOUS | Status: AC
  Administered 2019-03-29: 1000 mL via INTRAVENOUS
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
## 2019-03-29 NOTE — Discharge Instructions (Signed)
You have a broken rib. You will need to use the incentive spirometer as instructed to help prevent pneumonia.   You were given information to follow up with a regular doctor in regards to your broken rib.   You were given information to follow up with an eye doctor in regards to your eye.   Please call the offices to make follow up appointments within the next week.   Return

## 2019-04-08 ENCOUNTER — Encounter (HOSPITAL_COMMUNITY): Payer: Self-pay | Admitting: Emergency Medicine

## 2019-04-08 ENCOUNTER — Emergency Department (HOSPITAL_COMMUNITY): Payer: Medicaid Other

## 2019-04-08 ENCOUNTER — Other Ambulatory Visit: Payer: Self-pay

## 2019-04-08 ENCOUNTER — Emergency Department (HOSPITAL_COMMUNITY)
Admission: EM | Admit: 2019-04-08 | Discharge: 2019-04-08 | Disposition: A | Discharge status: Home / Self Care | Payer: Medicaid Other | Attending: Emergency Medicine | Admitting: Emergency Medicine

## 2019-04-08 DIAGNOSIS — F17210 Nicotine dependence, cigarettes, uncomplicated: Secondary | ICD-10-CM | POA: Diagnosis not present

## 2019-04-08 DIAGNOSIS — S2231XA Fracture of one rib, right side, initial encounter for closed fracture: Secondary | ICD-10-CM | POA: Insufficient documentation

## 2019-04-08 DIAGNOSIS — Y929 Unspecified place or not applicable: Secondary | ICD-10-CM | POA: Diagnosis not present

## 2019-04-08 DIAGNOSIS — Y999 Unspecified external cause status: Secondary | ICD-10-CM | POA: Insufficient documentation

## 2019-04-08 DIAGNOSIS — Z79899 Other long term (current) drug therapy: Secondary | ICD-10-CM | POA: Diagnosis not present

## 2019-04-08 DIAGNOSIS — I10 Essential (primary) hypertension: Secondary | ICD-10-CM | POA: Diagnosis not present

## 2019-04-08 DIAGNOSIS — S29091A Other injury of muscle and tendon of front wall of thorax, initial encounter: Secondary | ICD-10-CM | POA: Diagnosis present

## 2019-04-08 DIAGNOSIS — W19XXXA Unspecified fall, initial encounter: Secondary | ICD-10-CM | POA: Diagnosis not present

## 2019-04-08 DIAGNOSIS — Y939 Activity, unspecified: Secondary | ICD-10-CM | POA: Insufficient documentation

## 2019-04-08 MED ORDER — KETOROLAC TROMETHAMINE 60 MG/2ML IM SOLN
15.0000 mg | Freq: Once | INTRAMUSCULAR | Status: AC
  Administered 2019-04-08: 15 mg via INTRAMUSCULAR
  Filled 2019-04-08: qty 2

## 2019-04-08 MED ORDER — ACETAMINOPHEN 500 MG PO TABS
1000.0000 mg | ORAL_TABLET | Freq: Once | ORAL | Status: AC
  Administered 2019-04-08: 1000 mg via ORAL
  Filled 2019-04-08: qty 2
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
## 2019-04-08 NOTE — ED Provider Notes (Signed)
[facility] EMERGENCY DEPARTMENT Provider Note   CSN: [id number] Arrival date & time: 04/08/19  1325     History   Chief Complaint Chief Complaint  Patient presents with  . Fall  . Chest Pain    HPI Larry Byrd is a 63 y.o. male.     63 yo M with a chief complaint of a fall.  This occurred a couple days ago.  Was seen in the ED had a CT scan of his head and his neck and a plain film of his chest.  Found to have acute rib fracture and discharged home.  Patient here with continued pain to the left chest wall.  The history is provided by the patient.  Fall This is a new problem. The current episode started 2 days ago. The problem occurs constantly. The problem has not changed since onset.Associated symptoms include chest pain. Pertinent negatives include no abdominal pain, no headaches and no shortness of breath. The symptoms are aggravated by bending and twisting. Nothing relieves the symptoms. He has tried nothing for the symptoms. The treatment provided no relief.  Chest Pain Associated symptoms: no abdominal pain, no fever, no headache, no palpitations, no shortness of breath and no vomiting     Past Medical History:  Diagnosis Date  . Alcohol dependence (HCC)   . Diverticulosis   . Enlarged prostate   . Hypertension   . Major depression, chronic     Patient Active Problem List   Diagnosis Date Noted  . Malnutrition of moderate degree 09/13/2017  . Hypertension 09/10/2017  . AKI (acute kidney injury) (HCC) 09/10/2017  . Chest pain 09/10/2017  . Hyponatremia 09/10/2017  . Hypotension 09/10/2017  . Near syncope 09/10/2017  . Neck pain 09/10/2017    History reviewed. No pertinent surgical history.      Home Medications    Prior to Admission medications   Medication Sig Start Date End Date Taking? Authorizing Provider  amLODipine (NORVASC) 5 MG tablet Take 1 tablet (5 mg total) by mouth daily. Patient not taking: Reported on 03/29/2019  10/08/17   Donnetta Hutching, MD  FLUoxetine (PROZAC) 20 MG capsule Take 20 mg by mouth daily. 02/24/19   [provider]  lisinopril (ZESTRIL) 5 MG tablet Take 5 mg by mouth daily. 02/24/19   [provider]  lisinopril-hydrochlorothiazide (PRINZIDE,ZESTORETIC) 20-12.5 MG tablet Take 1 tablet by mouth daily. Patient not taking: Reported on 03/29/2019 08/11/17   Elson Areas, PA-C  Multiple Vitamin (MULTIVITAMIN WITH MINERALS) TABS tablet Take 1 tablet by mouth daily. Patient not taking: Reported on 10/08/2017 09/14/17   Kendell Bane, MD  potassium chloride SA (K-DUR,KLOR-CON) 20 MEQ tablet Take 2 tablets (40 mEq total) by mouth daily. Patient not taking: Reported on 03/29/2019 08/11/17   Elson Areas, PA-C    Family History No family history on file.  Social History Social History   Tobacco Use  . Smoking status: Current Every Day Smoker    Types: Cigarettes  . Smokeless tobacco: Never Used  Substance Use Topics  . Alcohol use: Yes  . Drug use: No     Allergies   Patient has no known allergies.   Review of Systems Review of Systems  Constitutional: Negative for chills and fever.  HENT: Negative for congestion and facial swelling.   Eyes: Negative for discharge and visual disturbance.  Respiratory: Negative for shortness of breath.   Cardiovascular: Positive for chest pain. Negative for palpitations.  Gastrointestinal: Negative for abdominal pain,  diarrhea and vomiting.  Musculoskeletal: Negative for arthralgias and myalgias.  Skin: Negative for color change and rash.  Neurological: Negative for tremors, syncope and headaches.  Psychiatric/Behavioral: Negative for confusion and dysphoric mood.     Physical Exam Updated Vital Signs BP (!) 160/81 (BP Location: Right Arm)   Pulse 73   Temp 98.7 F (37.1 C)   Resp 14   SpO2 100%   Physical Exam Vitals signs and nursing note reviewed.  Constitutional:      Appearance: He is well-developed.  HENT:      Head: Normocephalic and atraumatic.  Eyes:     Pupils: Pupils are equal, round, and reactive to light.  Neck:     Musculoskeletal: Normal range of motion and neck supple.     Vascular: No JVD.  Cardiovascular:     Rate and Rhythm: Normal rate and regular rhythm.     Heart sounds: No murmur. No friction rub. No gallop.   Pulmonary:     Effort: No respiratory distress.     Breath sounds: No wheezing.  Abdominal:     General: There is no distension.     Tenderness: There is no guarding or rebound.  Musculoskeletal: Normal range of motion.  Skin:    Coloration: Skin is not pale.     Findings: No rash.  Neurological:     Mental Status: He is alert and oriented to person, place, and time.  Psychiatric:        Behavior: Behavior normal.      ED Treatments / Results  Labs (all labs ordered are listed, but only abnormal results are displayed) Labs Reviewed - No data to display  EKG None  Radiology Dg Chest 2 View  Result Date: 04/08/2019 CLINICAL DATA:  Chest pain since a fall a few days ago. The microwave then fell on top of him. Right-sided rib pain. EXAM: CHEST - 2 VIEW COMPARISON:  Radiographs dated 03/29/2019 FINDINGS: There is a subtle fracture anterior aspect of the right eighth rib just lateral to the costochondral junction. There is a minimal deformity of the anterolateral aspect of the right sixth rib which I doubt represents a fracture. The heart size and pulmonary vascularity are normal. No pneumothorax. No effusions. IMPRESSION: Fracture of the anterolateral aspect of the right eighth rib. Electronically Signed   By: Lorriane Shire M.D.   On: 04/08/2019 14:24    Procedures Procedures (including critical care time)  Medications Ordered in ED Medications  acetaminophen (TYLENOL) tablet 1,000 mg (1,000 mg Oral Given 04/08/19 1433)  ketorolac (TORADOL) injection 15 mg (15 mg Intramuscular Given 04/08/19 1433)     Initial Impression / Assessment and Plan / ED Course   I have reviewed the triage vital signs and the nursing notes.  Pertinent labs & imaging results that were available during my care of the patient were reviewed by me and considered in my medical decision making (see chart for details).        63 yo M with a chief complaint of chest pain after a fall 3 days ago.  Was seen at the onset of the fall with a plain film that showed a rib fracture.  With ongoing symptoms we will obtain a chest x-ray to evaluate for pneumothorax or now underlying pneumonia.  He is well-appearing nontoxic on my exam.  Plain film viewed by me without focal infiltrate or ptx.  Known R 8th rib fx.  D/c home. PCP follow up.   2:40 PM:  I  have discussed the diagnosis/risks/treatment options with the patient and believe the pt to be eligible for discharge home to follow-up with PCP. We also discussed returning to the ED immediately if new or worsening sx occur. We discussed the sx which are most concerning (e.g., sudden worsening pain, fever, inability to tolerate by mouth) that necessitate immediate return. Medications administered to the patient during their visit and any new prescriptions provided to the patient are listed below.  Medications given during this visit Medications  acetaminophen (TYLENOL) tablet 1,000 mg (1,000 mg Oral Given 04/08/19 1433)  ketorolac (TORADOL) injection 15 mg (15 mg Intramuscular Given 04/08/19 1433)     The patient appears reasonably screen and/or stabilized for discharge and I doubt any other medical condition or other [facility] requiring further screening, evaluation, or treatment in the ED at this time prior to discharge.    Final Clinical Impressions(s) / ED Diagnoses   Final diagnoses:  Closed fracture of one rib of right side, initial encounter    ED Discharge Orders    None       Melene PlanFloyd, Delshawn Stech, DO 04/08/19 1440

## 2019-04-08 NOTE — Discharge Instructions (Signed)
Take 4 over the counter ibuprofen tablets 3 times a day or 2 over-the-counter naproxen tablets twice a day for pain. Also take tylenol 1000mg (2 extra strength) four times a day.    Use your incentive spirometer 10 minutes out of every hour that you are awake.  Please return for worsening trouble breathing or fever.

## 2019-04-08 NOTE — ED Notes (Signed)
Signature pad not responding. Pt discharge paperwork reviewed with patient. Pt verbalized understanding of instructions. Pt discharged.

## 2019-04-11 ENCOUNTER — Encounter (HOSPITAL_COMMUNITY): Payer: Self-pay | Admitting: Emergency Medicine

## 2019-04-11 DIAGNOSIS — F17210 Nicotine dependence, cigarettes, uncomplicated: Secondary | ICD-10-CM | POA: Diagnosis not present

## 2019-04-11 DIAGNOSIS — Z79899 Other long term (current) drug therapy: Secondary | ICD-10-CM | POA: Diagnosis not present

## 2019-04-11 DIAGNOSIS — I10 Essential (primary) hypertension: Secondary | ICD-10-CM | POA: Insufficient documentation

## 2019-04-11 DIAGNOSIS — Z5320 Procedure and treatment not carried out because of patient's decision for unspecified reasons: Secondary | ICD-10-CM | POA: Diagnosis not present

## 2019-04-11 DIAGNOSIS — M545 Low back pain: Secondary | ICD-10-CM | POA: Insufficient documentation

## 2019-04-11 NOTE — ED Triage Notes (Signed)
Patient here from home with complaints of left sided back pain. Patient acting weird. Not answering questions appropriately.

## 2019-04-12 ENCOUNTER — Emergency Department (HOSPITAL_COMMUNITY)
Admission: EM | Admit: 2019-04-12 | Discharge: 2019-04-12 | Discharge status: Against Medical Advice | Payer: Medicaid Other | Attending: Emergency Medicine | Admitting: Emergency Medicine

## 2019-04-12 ENCOUNTER — Encounter (HOSPITAL_COMMUNITY): Payer: Self-pay | Admitting: Emergency Medicine

## 2019-04-12 DIAGNOSIS — M545 Low back pain, unspecified: Secondary | ICD-10-CM

## 2019-04-12 MED ORDER — ACETAMINOPHEN 325 MG PO TABS
650.0000 mg | ORAL_TABLET | Freq: Once | ORAL | Status: AC
  Administered 2019-04-12: 05:00:00 650 mg via ORAL
  Filled 2019-04-12: qty 2

## 2019-04-12 NOTE — ED Notes (Signed)
Pt provided with sprite and crackers with peanut butter. Pt tolerated Sprite and Crackers.

## 2019-04-12 NOTE — ED Provider Notes (Signed)
[facility] DEPT Provider Note   CSN: [id number] Arrival date & time: 04/11/19  2105     History   Chief Complaint Chief Complaint  Patient presents with  . Back Pain    HPI Larry Byrd is a 63 y.o. male.     The history is provided by the patient.  Back Pain Location:  Sacro-iliac joint Quality:  Aching Radiates to:  Does not radiate Pain severity:  Mild Onset quality:  Gradual Timing:  Constant Progression:  Unchanged Chronicity:  New Context: not emotional stress, not falling, not jumping from heights, not lifting heavy objects, not MCA, not MVA, not occupational injury, not pedestrian accident, not physical stress, not recent illness, not recent injury and not twisting   Relieved by:  Nothing Worsened by:  Nothing Ineffective treatments:  None tried Associated symptoms: no abdominal pain, no abdominal swelling, no bladder incontinence, no bowel incontinence, no chest pain, no dysuria, no fever, no headaches, no leg pain, no numbness, no paresthesias, no pelvic pain, no perianal numbness, no tingling, no weakness and no weight loss   Risk factors: no hx of cancer     Past Medical History:  Diagnosis Date  . Alcohol dependence ([place])   . Diverticulosis   . Enlarged prostate   . Hypertension   . Major depression, chronic     Patient Active Problem List   Diagnosis Date Noted  . Malnutrition of moderate degree 09/13/2017  . Hypertension 09/10/2017  . AKI (acute kidney injury) ([place]) 09/10/2017  . Chest pain 09/10/2017  . Hyponatremia 09/10/2017  . Hypotension 09/10/2017  . Near syncope 09/10/2017  . Neck pain 09/10/2017    History reviewed. No pertinent surgical history.      Home Medications    Prior to Admission medications   Medication Sig Start Date End Date Taking? Authorizing Provider  amLODipine (NORVASC) 5 MG tablet Take 1 tablet (5 mg total) by mouth daily. Patient not taking: Reported on 03/29/2019 10/08/17    Nat Christen, MD  FLUoxetine (PROZAC) 20 MG capsule Take 20 mg by mouth daily. 02/24/19   [provider]  lisinopril (ZESTRIL) 5 MG tablet Take 5 mg by mouth daily. 02/24/19   [provider]  lisinopril-hydrochlorothiazide (PRINZIDE,ZESTORETIC) 20-12.5 MG tablet Take 1 tablet by mouth daily. Patient not taking: Reported on 03/29/2019 08/11/17   Fransico Meadow, PA-C  Multiple Vitamin (MULTIVITAMIN WITH MINERALS) TABS tablet Take 1 tablet by mouth daily. Patient not taking: Reported on 10/08/2017 09/14/17   Deatra James, MD  potassium chloride SA (K-DUR,KLOR-CON) 20 MEQ tablet Take 2 tablets (40 mEq total) by mouth daily. Patient not taking: Reported on 03/29/2019 08/11/17   Fransico Meadow, PA-C    Family History No family history on file.  Social History Social History   Tobacco Use  . Smoking status: Current Every Day Smoker    Types: Cigarettes  . Smokeless tobacco: Never Used  Substance Use Topics  . Alcohol use: Yes  . Drug use: No     Allergies   Patient has no known allergies.   Review of Systems Review of Systems  Constitutional: Negative for fever and weight loss.  HENT: Negative for congestion.   Eyes: Negative for visual disturbance.  Respiratory: Negative for apnea.   Cardiovascular: Negative for chest pain.  Gastrointestinal: Negative for abdominal pain and bowel incontinence.  Genitourinary: Negative for bladder incontinence, dysuria and pelvic pain.  Musculoskeletal: Positive for back pain.  Neurological: Negative for dizziness, tingling, facial  asymmetry, speech difficulty, weakness, numbness, headaches and paresthesias.  Psychiatric/Behavioral: Negative for agitation.  All other systems reviewed and are negative.    Physical Exam Updated Vital Signs BP (!) 151/90 (BP Location: Right Arm)   Pulse 84   Temp 98 F (36.7 C) (Oral)   Resp 18   SpO2 100%   Physical Exam Vitals signs and nursing note reviewed.  Constitutional:       General: He is not in acute distress. HENT:     Head: Normocephalic and atraumatic.     Nose: Nose normal.  Eyes:     Conjunctiva/sclera: Conjunctivae normal.     Pupils: Pupils are equal, round, and reactive to light.  Neck:     Musculoskeletal: Normal range of motion and neck supple.  Cardiovascular:     Rate and Rhythm: Normal rate and regular rhythm.     Pulses: Normal pulses.     Heart sounds: Normal heart sounds.  Pulmonary:     Effort: Pulmonary effort is normal.     Breath sounds: Normal breath sounds.  Abdominal:     General: Abdomen is flat. Bowel sounds are normal.     Tenderness: There is no abdominal tenderness. There is no guarding or rebound.  Musculoskeletal: Normal range of motion.  Skin:    General: Skin is warm and dry.     Capillary Refill: Capillary refill takes less than 2 seconds.  Neurological:     General: No focal deficit present.     Mental Status: He is alert and oriented to person, place, and time.     Motor: No weakness.     Deep Tendon Reflexes: Reflexes normal.  Psychiatric:        Mood and Affect: Mood normal.        Behavior: Behavior normal.      ED Treatments / Results  Labs (all labs ordered are listed, but only abnormal results are displayed) Labs Reviewed - No data to display  EKG None  Radiology No results found.  Procedures Procedures (including critical care time)  Medications Ordered in ED Medications  acetaminophen (TYLENOL) tablet 650 mg (650 mg Oral Given 04/12/19 0529)    No red flags patient is really here because he is drunk.    Larry Byrd was evaluated in Emergency Department on 04/12/2019 for the symptoms described in the history of present illness. He was evaluated in the context of the global COVID-19 pandemic, which necessitated consideration that the patient might be at risk for infection with the SARS-CoV-2 virus that causes COVID-19. Institutional protocols and algorithms that pertain to the evaluation  of patients at risk for COVID-19 are in a state of rapid change based on information released by regulatory bodies including the CDC and federal and state organizations. These policies and algorithms were followed during the patient's care in the ED.   Final Clinical Impressions(s) / ED Diagnoses    Return for intractable cough, coughing up blood,fevers >100.4 unrelieved by medication, shortness of breath, intractable vomiting, chest pain, shortness of breath, weakness,numbness, changes in speech, facial asymmetry,abdominal pain, passing out,Inability to tolerate liquids or food, cough, altered mental status or any concerns. No signs of systemic illness or infection. The patient is nontoxic-appearing on exam and vital signs are within normal limits.   I have reviewed the triage vital signs and the nursing notes. Pertinent labs &imaging results that were available during my care of the patient were reviewed by me and considered in my medical decision making (see  chart for details).After history, exam, and medical workup I feel the patient has beenappropriately medically screened and is safe for discharge home. Pertinent diagnoses were discussed with the patient. Patient was given return precautions.    Mar Zettler, MD 04/12/19 [phone]

## 2019-04-12 NOTE — ED Notes (Signed)
Pt refused to allow vital signs to be taken or sign for discharge.

## 2019-04-12 NOTE — ED Notes (Signed)
Pt ambulated to bed from triage with a slow, steady gait. No assistance required.

## 2019-04-24 ENCOUNTER — Encounter (HOSPITAL_COMMUNITY): Payer: Self-pay | Admitting: Emergency Medicine

## 2019-04-24 DIAGNOSIS — F17210 Nicotine dependence, cigarettes, uncomplicated: Secondary | ICD-10-CM | POA: Diagnosis not present

## 2019-04-24 DIAGNOSIS — Y939 Activity, unspecified: Secondary | ICD-10-CM | POA: Diagnosis not present

## 2019-04-24 DIAGNOSIS — Y908 Blood alcohol level of 240 mg/100 ml or more: Secondary | ICD-10-CM | POA: Diagnosis not present

## 2019-04-24 DIAGNOSIS — S2242XA Multiple fractures of ribs, left side, initial encounter for closed fracture: Secondary | ICD-10-CM | POA: Insufficient documentation

## 2019-04-24 DIAGNOSIS — F10920 Alcohol use, unspecified with intoxication, uncomplicated: Secondary | ICD-10-CM | POA: Diagnosis not present

## 2019-04-24 DIAGNOSIS — W19XXXA Unspecified fall, initial encounter: Secondary | ICD-10-CM | POA: Insufficient documentation

## 2019-04-24 DIAGNOSIS — R197 Diarrhea, unspecified: Secondary | ICD-10-CM | POA: Diagnosis not present

## 2019-04-24 DIAGNOSIS — Y929 Unspecified place or not applicable: Secondary | ICD-10-CM | POA: Insufficient documentation

## 2019-04-24 DIAGNOSIS — I10 Essential (primary) hypertension: Secondary | ICD-10-CM | POA: Diagnosis not present

## 2019-04-24 DIAGNOSIS — R630 Anorexia: Secondary | ICD-10-CM | POA: Diagnosis not present

## 2019-04-24 DIAGNOSIS — Z79899 Other long term (current) drug therapy: Secondary | ICD-10-CM | POA: Diagnosis not present

## 2019-04-24 DIAGNOSIS — Y998 Other external cause status: Secondary | ICD-10-CM | POA: Insufficient documentation

## 2019-04-24 DIAGNOSIS — S299XXA Unspecified injury of thorax, initial encounter: Secondary | ICD-10-CM | POA: Diagnosis present

## 2019-04-24 LAB — COMPREHENSIVE METABOLIC PANEL
ALT: 19 U/L (ref 0–44)
AST: 41 U/L (ref 15–41)
Albumin: 4.2 g/dL (ref 3.5–5.0)
Alkaline Phosphatase: 221 U/L — ABNORMAL HIGH (ref 38–126)
Anion gap: 9 (ref 5–15)
BUN: 8 mg/dL (ref 8–23)
CO2: 30 mmol/L (ref 22–32)
Calcium: 9.1 mg/dL (ref 8.9–10.3)
Chloride: 96 mmol/L — ABNORMAL LOW (ref 98–111)
Creatinine, Ser: 0.75 mg/dL (ref 0.61–1.24)
GFR calc Af Amer: >60 mL/min (ref >60)
GFR calc non Af Amer: >60 mL/min (ref >60)
Glucose, Bld: 114 mg/dL — ABNORMAL HIGH (ref 70–99)
Potassium: 4.2 mmol/L (ref 3.5–5.1)
Sodium: 135 mmol/L (ref 135–145)
Total Bilirubin: 0.7 mg/dL (ref 0.3–1.2)
Total Protein: 8.5 g/dL — ABNORMAL HIGH (ref 6.5–8.1)

## 2019-04-24 LAB — CBC
HCT: 40.4 % (ref 39.0–52.0)
Hemoglobin: 13.1 g/dL (ref 13.0–17.0)
MCH: 35.8 pg — ABNORMAL HIGH (ref 26.0–34.0)
MCHC: 32.4 g/dL (ref 30.0–36.0)
MCV: 110.4 fL — ABNORMAL HIGH (ref 80.0–100.0)
Platelets: 205 10*3/uL (ref 150–400)
RBC: 3.66 MIL/uL — ABNORMAL LOW (ref 4.22–5.81)
RDW: 14.4 % (ref 11.5–15.5)
WBC: 3.8 10*3/uL — ABNORMAL LOW (ref 4.0–10.5)
nRBC: 0 % (ref 0.0–0.2)

## 2019-04-24 LAB — ETHANOL: Alcohol, Ethyl (B): 377 mg/dL (ref <10)

## 2019-04-24 NOTE — ED Triage Notes (Signed)
Patient here from home via EMS reporting heavy ETOH today. Drinks daily. Fall today. Poor appetite and diarrhea x1-2 weeks .

## 2019-04-25 ENCOUNTER — Emergency Department (HOSPITAL_COMMUNITY): Payer: Medicaid Other

## 2019-04-25 ENCOUNTER — Emergency Department (HOSPITAL_COMMUNITY)
Admission: EM | Admit: 2019-04-25 | Discharge: 2019-04-25 | Disposition: A | Discharge status: Home / Self Care | Payer: Medicaid Other | Attending: Emergency Medicine | Admitting: Emergency Medicine

## 2019-04-25 DIAGNOSIS — S2242XA Multiple fractures of ribs, left side, initial encounter for closed fracture: Secondary | ICD-10-CM

## 2019-04-25 LAB — RAPID URINE DRUG SCREEN, HOSP PERFORMED
Amphetamines: NOT DETECTED
Barbiturates: NOT DETECTED
Benzodiazepines: NOT DETECTED
Cocaine: NOT DETECTED
Opiates: NOT DETECTED
Tetrahydrocannabinol: NOT DETECTED

## 2019-04-25 MED ORDER — MELOXICAM 7.5 MG PO TABS: 7.5000 mg | ORAL_TABLET | Freq: Every day | ORAL | 0 refills | Status: DC

## 2019-04-25 NOTE — ED Provider Notes (Signed)
[facility]-EMERGENCY DEPT Provider Note   CSN: [id number] Arrival date & time: 04/24/19  2102     History   Chief Complaint Chief Complaint  Patient presents with  . Alcohol Intoxication  . Diarrhea  . Anorexia    HPI Larry Byrd is a 63 y.o. male.     Patient presents to the emergency department with multiple complaints.  Patient reports that he has been drinking heavily today.  This is normal for him.  He had a fall earlier and now is complaining of pain in the left lateral and posterior ribs.  He also reports that he has not been able to eat or drink much over the last couple of weeks and has had diarrhea.     Past Medical History:  Diagnosis Date  . Alcohol dependence ([facility])   . Diverticulosis   . Enlarged prostate   . Hypertension   . Major depression, chronic     Patient Active Problem List   Diagnosis Date Noted  . Malnutrition of moderate degree 09/13/2017  . Hypertension 09/10/2017  . AKI (acute kidney injury) ([facility]) 09/10/2017  . Chest pain 09/10/2017  . Hyponatremia 09/10/2017  . Hypotension 09/10/2017  . Near syncope 09/10/2017  . Neck pain 09/10/2017    History reviewed. No pertinent surgical history.      Home Medications    Prior to Admission medications   Medication Sig Start Date End Date Taking? Authorizing Provider  amLODipine (NORVASC) 5 MG tablet Take 1 tablet (5 mg total) by mouth daily. Patient not taking: Reported on 03/29/2019 10/08/17   Donnetta Hutching, MD  FLUoxetine (PROZAC) 20 MG capsule Take 20 mg by mouth daily. 02/24/19   [provider]  lisinopril (ZESTRIL) 5 MG tablet Take 5 mg by mouth daily. 02/24/19   [provider]  lisinopril-hydrochlorothiazide (PRINZIDE,ZESTORETIC) 20-12.5 MG tablet Take 1 tablet by mouth daily. Patient not taking: Reported on 03/29/2019 08/11/17   Elson Areas, PA-C  Multiple Vitamin (MULTIVITAMIN WITH MINERALS) TABS tablet Take 1 tablet by mouth daily. Patient  not taking: Reported on 10/08/2017 09/14/17   Kendell Bane, MD  potassium chloride SA (K-DUR,KLOR-CON) 20 MEQ tablet Take 2 tablets (40 mEq total) by mouth daily. Patient not taking: Reported on 03/29/2019 08/11/17   Elson Areas, PA-C    Family History No family history on file.  Social History Social History   Tobacco Use  . Smoking status: Current Every Day Smoker    Types: Cigarettes  . Smokeless tobacco: Never Used  Substance Use Topics  . Alcohol use: Yes  . Drug use: No     Allergies   Patient has no known allergies.   Review of Systems Review of Systems  Constitutional: Positive for appetite change.  Gastrointestinal: Positive for diarrhea.  Musculoskeletal:       Rib pain  All other systems reviewed and are negative.    Physical Exam Updated Vital Signs BP (!) 141/92 (BP Location: Right Arm)   Pulse 75   Temp 97.8 F (36.6 C) (Oral)   Resp 18   Ht 5' 10.5" (1.791 m)   Wt 63.5 kg   SpO2 100%   BMI 19.80 kg/m   Physical Exam Vitals signs and nursing note reviewed.  Constitutional:      General: He is not in acute distress.    Appearance: Normal appearance. He is well-developed.  HENT:     Head: Normocephalic and atraumatic.     Right Ear: Hearing normal.  Left Ear: Hearing normal.     Nose: Nose normal.  Eyes:     Conjunctiva/sclera: Conjunctivae normal.     Pupils: Pupils are equal, round, and reactive to light.  Neck:     Musculoskeletal: Normal range of motion and neck supple.  Cardiovascular:     Rate and Rhythm: Regular rhythm.     Heart sounds: S1 normal and S2 normal. No murmur. No friction rub. No gallop.   Pulmonary:     Effort: Pulmonary effort is normal. No respiratory distress.     Breath sounds: Normal breath sounds.  Chest:     Chest wall: No tenderness.    Abdominal:     General: Bowel sounds are normal.     Palpations: Abdomen is soft.     Tenderness: There is no abdominal tenderness. There is no guarding or  rebound. Negative signs include Murphy's sign and McBurney's sign.     Hernia: No hernia is present.  Musculoskeletal: Normal range of motion.       Back:  Skin:    General: Skin is warm and dry.     Findings: No rash.  Neurological:     Mental Status: He is alert and oriented to person, place, and time.     GCS: GCS eye subscore is 4. GCS verbal subscore is 5. GCS motor subscore is 6.     Cranial Nerves: No cranial nerve deficit.     Sensory: No sensory deficit.     Coordination: Coordination normal.  Psychiatric:        Speech: Speech normal.        Behavior: Behavior normal.        Thought Content: Thought content normal.      ED Treatments / Results  Labs (all labs ordered are listed, but only abnormal results are displayed) Labs Reviewed  COMPREHENSIVE METABOLIC PANEL - Abnormal; Notable for the following components:      Result Value   Chloride 96 (*)    Glucose, Bld 114 (*)    Total Protein 8.5 (*)    Alkaline Phosphatase 221 (*)    All other components within normal limits  ETHANOL - Abnormal; Notable for the following components:   Alcohol, Ethyl (B) 377 (*)    All other components within normal limits  CBC - Abnormal; Notable for the following components:   WBC 3.8 (*)    RBC 3.66 (*)    MCV 110.4 (*)    MCH 35.8 (*)    All other components within normal limits  RAPID URINE DRUG SCREEN, HOSP PERFORMED    EKG None  Radiology Dg Ribs Unilateral W/chest Left  Result Date: 04/25/2019 CLINICAL DATA:  Left rib pain after fall EXAM: LEFT RIBS AND CHEST - 3+ VIEW COMPARISON:  Radiograph 04/08/2019 FINDINGS: Left posterolateral eighth and ninth rib fractures are present. There is no evidence of pneumothorax or pleural effusion. Both lungs are clear. Heart size and mediastinal contours are within normal limits. IMPRESSION: Left posterolateral eighth and ninth minimally displaced rib fractures. No pneumothorax or effusion. Previously seen right eighth rib fracture is  not well visualized on this exam. No active cardiopulmonary disease. Electronically Signed   By: Lovena Le M.D.   On: 04/25/2019 02:22    Procedures Procedures (including critical care time)  Medications Ordered in ED Medications - No data to display   Initial Impression / Assessment and Plan / ED Course  I have reviewed the triage vital signs and the nursing notes.  Pertinent labs & imaging results that were available during my care of the patient were reviewed by me and considered in my medical decision making (see chart for details).        Patient presents to the emergency department after a fall.  He admits to drinking daily.  He reports that he fell onto his left side and is complaining of left rib pain.  No evidence of head injury.  No neck or midline back pain or tenderness.  Examination reveals tenderness in the posterior and lateral mid ribs without crepitance.  Lungs are clear on auscultation.  X-ray does show a minimally displaced left eighth and ninth rib fractures, explaining his symptoms.  Remainder of work-up unremarkable.  Final Clinical Impressions(s) / ED Diagnoses   Final diagnoses:  Closed fracture of multiple ribs of left side, initial encounter    ED Discharge Orders    None       Pollina, Canary Brimhristopher J, MD 04/25/19 [phone]

## 2019-04-25 NOTE — ED Notes (Addendum)
When returning from the bathroom patient asked where is room was and attempted to enter room 17, patient was instructed where is his bed was and stated "I just asked you where it was. You have a bad attitude"

## 2019-04-25 NOTE — ED Notes (Addendum)
Pt ambulated to the bathroom without assistance. Gait steady. Pt made it clear to all staff that he only likes to be referred to as "Larry Byrd".

## 2019-04-25 NOTE — ED Notes (Signed)
Discharge instructions reviewed with no questions.

## 2019-04-25 NOTE — ED Notes (Signed)
Staff attempted to assist pt back to his bed and asked he put his urine sample on the desk near his bed. Pt stated "You mean this table. It isnt a desk"  When staff agreed that is the correct spot, pt tried to walk into room 17. Staff told pt that wasn't the correct room and showed him where his bed was and pt looked at staff and said  "you have a bad attitude." Bed in and lowest position. Pt within view of staff.

## 2019-04-25 NOTE — ED Notes (Signed)
Pt asleep into the lobby when called for a bed.

## 2019-04-25 NOTE — ED Notes (Addendum)
Pt sleeping with bed in locked and lowest position. No acute distress.

## 2019-05-09 ENCOUNTER — Other Ambulatory Visit: Payer: Self-pay

## 2019-05-09 DIAGNOSIS — W19XXXA Unspecified fall, initial encounter: Secondary | ICD-10-CM | POA: Diagnosis not present

## 2019-05-09 DIAGNOSIS — Y939 Activity, unspecified: Secondary | ICD-10-CM | POA: Insufficient documentation

## 2019-05-09 DIAGNOSIS — S20412A Abrasion of left back wall of thorax, initial encounter: Secondary | ICD-10-CM | POA: Diagnosis not present

## 2019-05-09 DIAGNOSIS — Y929 Unspecified place or not applicable: Secondary | ICD-10-CM | POA: Insufficient documentation

## 2019-05-09 DIAGNOSIS — Y999 Unspecified external cause status: Secondary | ICD-10-CM | POA: Diagnosis not present

## 2019-05-09 DIAGNOSIS — S299XXA Unspecified injury of thorax, initial encounter: Secondary | ICD-10-CM | POA: Diagnosis present

## 2019-05-09 NOTE — ED Triage Notes (Signed)
Pt arrives via EMS with companion who called out because the patient had not been feeling good these days and seems to be "talking out of his head'-displaying odd behavior.  Heavy ETOH use hx. Denied SI en route. Oriented x 4 en route. 130 Palpated BP, 100 HR 99%, 18 RR, 128.

## 2019-05-09 NOTE — ED Notes (Signed)
Pt says he keeps falling. Pt says he went to the bathroom and fell. Pt trying to push himself in the w/c and says that he does not want to answer any questions without his wife.

## 2019-05-10 ENCOUNTER — Encounter (HOSPITAL_COMMUNITY): Payer: Self-pay | Admitting: Emergency Medicine

## 2019-05-10 ENCOUNTER — Emergency Department (HOSPITAL_COMMUNITY)
Admission: EM | Admit: 2019-05-10 | Discharge: 2019-05-10 | Disposition: A | Discharge status: Home / Self Care | Payer: Medicaid Other | Attending: Emergency Medicine | Admitting: Emergency Medicine

## 2019-05-10 DIAGNOSIS — T148XXA Other injury of unspecified body region, initial encounter: Secondary | ICD-10-CM

## 2019-05-10 MED ORDER — ACETAMINOPHEN 500 MG PO TABS
1000.0000 mg | ORAL_TABLET | Freq: Once | ORAL | Status: AC
  Administered 2019-05-10: 1000 mg via ORAL
  Filled 2019-05-10: qty 2

## 2019-05-10 MED ORDER — LIDOCAINE 5 % EX PTCH
2.0000 | MEDICATED_PATCH | CUTANEOUS | Status: DC
  Administered 2019-05-10: 2 via TRANSDERMAL
  Filled 2019-05-10: qty 2

## 2019-05-10 NOTE — ED Provider Notes (Signed)
[facility] DEPT Provider Note   CSN: [id number] Arrival date & time: 05/09/19  2035     History   Chief Complaint Chief Complaint  Patient presents with  . Fall    HPI Larry Byrd is a 63 y.o. male.     The history is provided by the patient.  Fall This is a chronic problem. The current episode started more than 1 week ago [place] in September but is still having pain at the site of his back abrasion). The problem occurs rarely. The problem has been resolved. Pertinent negatives include no chest pain, no abdominal pain, no headaches and no shortness of breath. Nothing aggravates the symptoms. Nothing relieves the symptoms. He has tried nothing for the symptoms. The treatment provided no relief.  Patient has been drinking alcohol and worried he has an infected abrasion on his back from his previous fall.  He has not Larry Byrd anything for the abrasion.  No f/c/r.   Past Medical History:  Diagnosis Date  . Alcohol dependence ([place])   . Diverticulosis   . Enlarged prostate   . Hypertension   . Major depression, chronic     Patient Active Problem List   Diagnosis Date Noted  . Malnutrition of moderate degree 09/13/2017  . Hypertension 09/10/2017  . AKI (acute kidney injury) ([place]) 09/10/2017  . Chest pain 09/10/2017  . Hyponatremia 09/10/2017  . Hypotension 09/10/2017  . Near syncope 09/10/2017  . Neck pain 09/10/2017    History reviewed. No pertinent surgical history.      Home Medications    Prior to Admission medications   Medication Sig Start Date End Date Taking? Authorizing Provider  amLODipine (NORVASC) 5 MG tablet Take 1 tablet (5 mg total) by mouth daily. Patient not taking: Reported on 03/29/2019 10/08/17   Nat Christen, MD  FLUoxetine (PROZAC) 20 MG capsule Take 20 mg by mouth daily. 02/24/19   [provider]  lisinopril (ZESTRIL) 5 MG tablet Take 5 mg by mouth daily. 02/24/19   [provider]   lisinopril-hydrochlorothiazide (PRINZIDE,ZESTORETIC) 20-12.5 MG tablet Take 1 tablet by mouth daily. Patient not taking: Reported on 03/29/2019 08/11/17   Fransico Meadow, PA-C  meloxicam (MOBIC) 7.5 MG tablet Take 1 tablet (7.5 mg total) by mouth daily. 04/25/19   Orpah Greek, MD  Multiple Vitamin (MULTIVITAMIN WITH MINERALS) TABS tablet Take 1 tablet by mouth daily. Patient not taking: Reported on 10/08/2017 09/14/17   Deatra James, MD  potassium chloride SA (K-DUR,KLOR-CON) 20 MEQ tablet Take 2 tablets (40 mEq total) by mouth daily. Patient not taking: Reported on 03/29/2019 08/11/17   Fransico Meadow, PA-C    Family History No family history on file.  Social History Social History   Tobacco Use  . Smoking status: Current Every Day Smoker    Types: Cigarettes  . Smokeless tobacco: Never Used  Substance Use Topics  . Alcohol use: Yes  . Drug use: No     Allergies   Patient has no known allergies.   Review of Systems Review of Systems  Constitutional: Negative for unexpected weight change.  HENT: Negative for congestion.   Respiratory: Negative for cough and shortness of breath.   Cardiovascular: Negative for chest pain.  Gastrointestinal: Negative for abdominal pain.  Genitourinary: Negative for difficulty urinating.  Musculoskeletal: Negative for arthralgias.  Neurological: Negative for headaches.  Psychiatric/Behavioral: Negative for agitation.  All other systems reviewed and are negative.    Physical Exam Updated Vital Signs BP Marland Kitchen)  141/95 (BP Location: Left Arm)   Pulse (!) 101   Temp 98 F (36.7 C) (Oral)   Resp 15   Ht 5\' 10"  (1.778 m)   Wt 52.1 kg   SpO2 100%   BMI 16.49 kg/m   Physical Exam Vitals signs and nursing note reviewed.  Constitutional:      General: He is not in acute distress.    Appearance: He is normal weight.  HENT:     Head: Normocephalic and atraumatic.     Nose: Nose normal.  Eyes:     Conjunctiva/sclera:  Conjunctivae normal.     Pupils: Pupils are equal, round, and reactive to light.  Neck:     Musculoskeletal: Normal range of motion and neck supple.  Cardiovascular:     Rate and Rhythm: Normal rate and regular rhythm.     Pulses: Normal pulses.     Heart sounds: Normal heart sounds.  Pulmonary:     Effort: Pulmonary effort is normal.     Breath sounds: Normal breath sounds.  Abdominal:     General: Abdomen is flat. Bowel sounds are normal.     Tenderness: There is no abdominal tenderness. There is no guarding.  Musculoskeletal: Normal range of motion.        General: No tenderness or deformity.       Arms:  Skin:    General: Skin is warm and dry.     Capillary Refill: Capillary refill takes less than 2 seconds.  Neurological:     General: No focal deficit present.     Mental Status: He is alert and oriented to person, place, and time.  Psychiatric:        Mood and Affect: Mood normal.        Behavior: Behavior normal.      ED Treatments / Results  Labs (all labs ordered are listed, but only abnormal results are displayed) Labs Reviewed - No data to display  EKG None  Radiology No results found.  Procedures Procedures (including critical care time)  Medications Ordered in ED Medications  lidocaine (LIDODERM) 5 % 2 patch (has no administration in time range)  acetaminophen (TYLENOL) tablet 1,000 mg (has no administration in time range)     Well appearing no signs of trauma.  No indication for imaging at this time. Wound care instructions given.    Final Clinical Impressions(s) / ED Diagnoses   Return for weakness, numbness, changes in vision or speech, fevers >100.4 unrelieved by medication, shortness of breath, intractable vomiting, or diarrhea, abdominal pain, Inability to tolerate liquids or food, cough, altered mental status or any concerns. No signs of systemic illness or infection. The patient is nontoxic-appearing on exam and vital signs are within  normal limits.   I have reviewed the triage vital signs and the nursing notes. Pertinent labs &imaging results that were available during my care of the patient were reviewed by me and considered in my medical decision making (see chart for details).  After history, exam, and medical workup I feel the patient has been appropriately medically screened and is safe for discharge home. Pertinent diagnoses were discussed with the patient. Patient was given return precautions.      Taraneh Metheney, MD 05/10/19 [phone]

## 2019-05-10 NOTE — ED Notes (Signed)
Palumbo, MD at bedside. 

## 2019-05-10 NOTE — ED Notes (Signed)
Pt ambulatory in triage and ambulating from wheelchair to bed.

## 2019-05-10 NOTE — ED Notes (Signed)
Pt verbalized discharge instructions and follow up care. Alert and ambulatory. No iv.  

## 2019-05-10 NOTE — ED Notes (Addendum)
Patient states that he is still having rib pain from fall on 04/25/2019. He states the pain never went away.

## 2019-05-10 NOTE — ED Notes (Signed)
Patient smell of ETOH.

## 2019-05-14 ENCOUNTER — Other Ambulatory Visit: Payer: Self-pay

## 2019-05-14 ENCOUNTER — Inpatient Hospital Stay (HOSPITAL_COMMUNITY)
Admission: EM | Admit: 2019-05-14 | Discharge: 2019-05-21 | DRG: 897 | Disposition: A | Discharge status: Home / Self Care | Payer: Medicaid Other | Attending: Internal Medicine | Admitting: Internal Medicine

## 2019-05-14 ENCOUNTER — Emergency Department (HOSPITAL_COMMUNITY): Payer: Medicaid Other

## 2019-05-14 ENCOUNTER — Encounter (HOSPITAL_COMMUNITY): Payer: Self-pay | Admitting: Emergency Medicine

## 2019-05-14 DIAGNOSIS — D61818 Other pancytopenia: Secondary | ICD-10-CM | POA: Diagnosis present

## 2019-05-14 DIAGNOSIS — E876 Hypokalemia: Secondary | ICD-10-CM | POA: Diagnosis present

## 2019-05-14 DIAGNOSIS — E441 Mild protein-calorie malnutrition: Secondary | ICD-10-CM | POA: Diagnosis present

## 2019-05-14 DIAGNOSIS — R569 Unspecified convulsions: Secondary | ICD-10-CM | POA: Diagnosis present

## 2019-05-14 DIAGNOSIS — F10239 Alcohol dependence with withdrawal, unspecified: Secondary | ICD-10-CM

## 2019-05-14 DIAGNOSIS — N400 Enlarged prostate without lower urinary tract symptoms: Secondary | ICD-10-CM | POA: Diagnosis present

## 2019-05-14 DIAGNOSIS — Z23 Encounter for immunization: Secondary | ICD-10-CM

## 2019-05-14 DIAGNOSIS — Z20828 Contact with and (suspected) exposure to other viral communicable diseases: Secondary | ICD-10-CM | POA: Diagnosis present

## 2019-05-14 DIAGNOSIS — R509 Fever, unspecified: Secondary | ICD-10-CM | POA: Diagnosis not present

## 2019-05-14 DIAGNOSIS — R748 Abnormal levels of other serum enzymes: Secondary | ICD-10-CM | POA: Diagnosis not present

## 2019-05-14 DIAGNOSIS — F17210 Nicotine dependence, cigarettes, uncomplicated: Secondary | ICD-10-CM | POA: Diagnosis present

## 2019-05-14 DIAGNOSIS — F338 Other recurrent depressive disorders: Secondary | ICD-10-CM | POA: Diagnosis present

## 2019-05-14 DIAGNOSIS — E871 Hypo-osmolality and hyponatremia: Secondary | ICD-10-CM | POA: Diagnosis present

## 2019-05-14 DIAGNOSIS — F10139 Alcohol abuse with withdrawal, unspecified: Secondary | ICD-10-CM | POA: Diagnosis not present

## 2019-05-14 DIAGNOSIS — Z681 Body mass index (BMI) 19 or less, adult: Secondary | ICD-10-CM

## 2019-05-14 DIAGNOSIS — G40509 Epileptic seizures related to external causes, not intractable, without status epilepticus: Secondary | ICD-10-CM | POA: Diagnosis not present

## 2019-05-14 DIAGNOSIS — K7010 Alcoholic hepatitis without ascites: Secondary | ICD-10-CM | POA: Diagnosis present

## 2019-05-14 DIAGNOSIS — Z79899 Other long term (current) drug therapy: Secondary | ICD-10-CM

## 2019-05-14 DIAGNOSIS — D696 Thrombocytopenia, unspecified: Secondary | ICD-10-CM | POA: Diagnosis not present

## 2019-05-14 DIAGNOSIS — F10230 Alcohol dependence with withdrawal, uncomplicated: Secondary | ICD-10-CM | POA: Diagnosis present

## 2019-05-14 DIAGNOSIS — I10 Essential (primary) hypertension: Secondary | ICD-10-CM | POA: Diagnosis present

## 2019-05-14 DIAGNOSIS — D539 Nutritional anemia, unspecified: Secondary | ICD-10-CM | POA: Diagnosis not present

## 2019-05-14 DIAGNOSIS — Z590 Homelessness: Secondary | ICD-10-CM | POA: Diagnosis not present

## 2019-05-14 DIAGNOSIS — F10930 Alcohol use, unspecified with withdrawal, uncomplicated: Secondary | ICD-10-CM

## 2019-05-14 DIAGNOSIS — R519 Headache, unspecified: Secondary | ICD-10-CM | POA: Diagnosis present

## 2019-05-14 DIAGNOSIS — F10939 Alcohol use, unspecified with withdrawal, unspecified: Secondary | ICD-10-CM

## 2019-05-14 DIAGNOSIS — R64 Cachexia: Secondary | ICD-10-CM | POA: Diagnosis present

## 2019-05-14 DIAGNOSIS — S0081XA Abrasion of other part of head, initial encounter: Secondary | ICD-10-CM | POA: Diagnosis present

## 2019-05-14 DIAGNOSIS — W19XXXA Unspecified fall, initial encounter: Secondary | ICD-10-CM | POA: Diagnosis present

## 2019-05-14 DIAGNOSIS — R7401 Elevation of levels of liver transaminase levels: Secondary | ICD-10-CM | POA: Diagnosis not present

## 2019-05-14 LAB — COMPREHENSIVE METABOLIC PANEL
ALT: 46 U/L — ABNORMAL HIGH (ref 0–44)
ALT: 39 U/L (ref 0–44)
AST: 143 U/L — ABNORMAL HIGH (ref 15–41)
AST: 92 U/L — ABNORMAL HIGH (ref 15–41)
Albumin: 3.7 g/dL (ref 3.5–5.0)
Albumin: 3 g/dL — ABNORMAL LOW (ref 3.5–5.0)
Alkaline Phosphatase: 156 U/L — ABNORMAL HIGH (ref 38–126)
Alkaline Phosphatase: 118 U/L (ref 38–126)
Anion gap: 19 — ABNORMAL HIGH (ref 5–15)
Anion gap: 14 (ref 5–15)
BUN: <5 mg/dL — ABNORMAL LOW (ref 8–23)
BUN: 6 mg/dL — ABNORMAL LOW (ref 8–23)
CO2: 22 mmol/L (ref 22–32)
CO2: 21 mmol/L — ABNORMAL LOW (ref 22–32)
Calcium: 9.4 mg/dL (ref 8.9–10.3)
Calcium: 8.3 mg/dL — ABNORMAL LOW (ref 8.9–10.3)
Chloride: 87 mmol/L — ABNORMAL LOW (ref 98–111)
Chloride: 91 mmol/L — ABNORMAL LOW (ref 98–111)
Creatinine, Ser: 1.14 mg/dL (ref 0.61–1.24)
Creatinine, Ser: 1.21 mg/dL (ref 0.61–1.24)
GFR calc Af Amer: >60 mL/min (ref >60)
GFR calc Af Amer: >60 mL/min (ref >60)
GFR calc non Af Amer: >60 mL/min (ref >60)
GFR calc non Af Amer: >60 mL/min (ref >60)
Glucose, Bld: 69 mg/dL — ABNORMAL LOW (ref 70–99)
Glucose, Bld: 133 mg/dL — ABNORMAL HIGH (ref 70–99)
Potassium: 3.1 mmol/L — ABNORMAL LOW (ref 3.5–5.1)
Potassium: 3.5 mmol/L (ref 3.5–5.1)
Sodium: 128 mmol/L — ABNORMAL LOW (ref 135–145)
Sodium: 126 mmol/L — ABNORMAL LOW (ref 135–145)
Total Bilirubin: 2 mg/dL — ABNORMAL HIGH (ref 0.3–1.2)
Total Bilirubin: 1.7 mg/dL — ABNORMAL HIGH (ref 0.3–1.2)
Total Protein: 7.2 g/dL (ref 6.5–8.1)
Total Protein: 5.9 g/dL — ABNORMAL LOW (ref 6.5–8.1)

## 2019-05-14 LAB — CBC WITH DIFFERENTIAL/PLATELET
Abs Immature Granulocytes: 0.04 10*3/uL (ref 0.00–0.07)
Basophils Absolute: 0 10*3/uL (ref 0.0–0.1)
Basophils Relative: 0 %
Eosinophils Absolute: 0 10*3/uL (ref 0.0–0.5)
Eosinophils Relative: 0 %
HCT: 35.8 % — ABNORMAL LOW (ref 39.0–52.0)
Hemoglobin: 12.4 g/dL — ABNORMAL LOW (ref 13.0–17.0)
Immature Granulocytes: 1 %
Lymphocytes Relative: 9 %
Lymphs Abs: 0.6 10*3/uL — ABNORMAL LOW (ref 0.7–4.0)
MCH: 35.9 pg — ABNORMAL HIGH (ref 26.0–34.0)
MCHC: 34.6 g/dL (ref 30.0–36.0)
MCV: 103.8 fL — ABNORMAL HIGH (ref 80.0–100.0)
Monocytes Absolute: 0.4 10*3/uL (ref 0.1–1.0)
Monocytes Relative: 6 %
Neutro Abs: 5.6 10*3/uL (ref 1.7–7.7)
Neutrophils Relative %: 84 %
Platelets: 95 10*3/uL — ABNORMAL LOW (ref 150–400)
RBC: 3.45 MIL/uL — ABNORMAL LOW (ref 4.22–5.81)
RDW: 12.2 % (ref 11.5–15.5)
WBC: 6.7 10*3/uL (ref 4.0–10.5)
nRBC: 0 % (ref 0.0–0.2)

## 2019-05-14 LAB — MAGNESIUM: Magnesium: 2.2 mg/dL (ref 1.7–2.4)

## 2019-05-14 LAB — CBG MONITORING, ED
Glucose-Capillary: 70 mg/dL (ref 70–99)
Glucose-Capillary: 74 mg/dL (ref 70–99)

## 2019-05-14 LAB — ETHANOL: Alcohol, Ethyl (B): <10 mg/dL (ref <10)

## 2019-05-14 LAB — HIV ANTIBODY (ROUTINE TESTING W REFLEX): HIV Screen 4th Generation wRfx: NONREACTIVE

## 2019-05-14 LAB — SARS CORONAVIRUS 2 (TAT 6-24 HRS): SARS Coronavirus 2: NEGATIVE

## 2019-05-14 LAB — PHOSPHORUS: Phosphorus: 3.6 mg/dL (ref 2.5–4.6)

## 2019-05-14 MED ORDER — ENOXAPARIN SODIUM 40 MG/0.4ML ~~LOC~~ SOLN
40.0000 mg | SUBCUTANEOUS | Status: DC
  Administered 2019-05-15 – 2019-05-21 (×7): 40 mg via SUBCUTANEOUS
  Filled 2019-05-14 (×7): qty 0.4

## 2019-05-14 MED ORDER — LORAZEPAM 2 MG/ML IJ SOLN
1.0000 mg | INTRAMUSCULAR | Status: AC | PRN
  Administered 2019-05-16: 2 mg via INTRAVENOUS
  Filled 2019-05-14: qty 1

## 2019-05-14 MED ORDER — LORAZEPAM 1 MG PO TABS: 0.0000 mg | ORAL_TABLET | Freq: Two times a day (BID) | ORAL | Status: DC

## 2019-05-14 MED ORDER — LORAZEPAM 1 MG PO TABS
1.0000 mg | ORAL_TABLET | ORAL | Status: AC | PRN
  Administered 2019-05-15: 2 mg via ORAL
  Filled 2019-05-14: qty 2

## 2019-05-14 MED ORDER — LORAZEPAM 1 MG PO TABS: 0.0000 mg | ORAL_TABLET | Freq: Four times a day (QID) | ORAL | Status: DC

## 2019-05-14 MED ORDER — SODIUM CHLORIDE 0.9% FLUSH
3.0000 mL | Freq: Two times a day (BID) | INTRAVENOUS | Status: DC
  Administered 2019-05-14 – 2019-05-21 (×13): 3 mL via INTRAVENOUS

## 2019-05-14 MED ORDER — THIAMINE HCL 100 MG/ML IJ SOLN
100.0000 mg | Freq: Every day | INTRAMUSCULAR | Status: DC
  Filled 2019-05-14: qty 2

## 2019-05-14 MED ORDER — LORAZEPAM 2 MG/ML IJ SOLN
1.0000 mg | Freq: Once | INTRAMUSCULAR | Status: AC
  Administered 2019-05-14: 1 mg via INTRAVENOUS
  Filled 2019-05-14: qty 1

## 2019-05-14 MED ORDER — POTASSIUM CHLORIDE CRYS ER 20 MEQ PO TBCR
40.0000 meq | EXTENDED_RELEASE_TABLET | Freq: Once | ORAL | Status: AC
  Administered 2019-05-14: 40 meq via ORAL
  Filled 2019-05-14: qty 2

## 2019-05-14 MED ORDER — LORAZEPAM 2 MG/ML IJ SOLN: 0.0000 mg | Freq: Four times a day (QID) | INTRAMUSCULAR | Status: DC

## 2019-05-14 MED ORDER — FOLIC ACID 5 MG/ML IJ SOLN
1.0000 mg | Freq: Every day | INTRAMUSCULAR | Status: DC
  Administered 2019-05-14 – 2019-05-16 (×3): 1 mg via INTRAVENOUS
  Filled 2019-05-14 (×3): qty 0.2

## 2019-05-14 MED ORDER — SODIUM CHLORIDE 0.9 % IV SOLN
INTRAVENOUS | Status: DC
  Administered 2019-05-14: 23:00:00 via INTRAVENOUS

## 2019-05-14 MED ORDER — LORAZEPAM 2 MG/ML IJ SOLN: 0.0000 mg | Freq: Two times a day (BID) | INTRAMUSCULAR | Status: DC

## 2019-05-14 MED ORDER — VITAMIN B-1 100 MG PO TABS
100.0000 mg | ORAL_TABLET | Freq: Every day | ORAL | Status: DC
  Administered 2019-05-15 – 2019-05-21 (×7): 100 mg via ORAL
  Filled 2019-05-14 (×7): qty 1

## 2019-05-14 MED ORDER — SODIUM CHLORIDE 0.9 % IV BOLUS
500.0000 mL | Freq: Once | INTRAVENOUS | Status: AC
  Administered 2019-05-14: 500 mL via INTRAVENOUS

## 2019-05-14 MED ORDER — LORAZEPAM 2 MG/ML IJ SOLN
1.0000 mg | Freq: Once | INTRAMUSCULAR | Status: AC
  Administered 2019-05-14: 12:00:00 1 mg via INTRAVENOUS
  Filled 2019-05-14: qty 1

## 2019-05-14 MED ORDER — CHLORDIAZEPOXIDE HCL 25 MG PO CAPS
25.0000 mg | ORAL_CAPSULE | Freq: Three times a day (TID) | ORAL | Status: DC
  Administered 2019-05-14 – 2019-05-15 (×2): 25 mg via ORAL
  Filled 2019-05-14 (×2): qty 1

## 2019-05-14 NOTE — ED Notes (Signed)
Dinner Tray Ordered @ 1709. 

## 2019-05-14 NOTE — ED Notes (Signed)
ED TO INPATIENT HANDOFF REPORT  ED Nurse Name and Phone #: Chrisotpher Rivero [phone]  S Name/Age/Gender Larry Byrd 63 y.o. male Room/Bed: H021C/H021C  Code Status   Code Status: Full Code  Home/SNF/Other homeless Patient oriented to: self and place Is this baseline? No   Triage Complete: Triage complete  Chief Complaint seizure  Triage Note Pt arrives via EMS from hotel where patient had 2 seizures witnessed. Pt on arrival alert, appropriate, incontinent of stool. VSS. NAD at present. 22g L FA. CBG 166. ST 110 on ekg. bp in transport 136/100.   Allergies No Known Allergies  Level of Care/Admitting Diagnosis ED Disposition    ED Disposition Condition Comment   Admit  Hospital Area: [facility] [[id number]]  Level of Care: Progressive [102]  Covid Evaluation: Asymptomatic Screening Protocol (No Symptoms)  Diagnosis: Alcohol withdrawal seizure [facility]) [[id number]]  Admitting Physician: Earl Lagos [phone]  Attending Physician: Earl Lagos [phone]  Estimated length of stay: past midnight tomorrow  Certification:: I certify this patient will need inpatient services for at least 2 midnights  PT Class (Do Not Modify): Inpatient [101]  PT Acc Code (Do Not Modify): Private [1]       B Medical/Surgery History Past Medical History:  Diagnosis Date  . Alcohol dependence (HCC)   . Diverticulosis   . Enlarged prostate   . Hypertension   . Major depression, chronic    History reviewed. No pertinent surgical history.   A IV Location/Drains/Wounds Patient Lines/Drains/Airways Status   Active Line/Drains/Airways    Name:   Placement date:   Placement time:   Site:   Days:   Peripheral IV 05/14/19 Left Forearm   05/14/19    -    Forearm   less than 1   Peripheral IV 05/14/19 Left Other (Comment)   05/14/19    2123    Other (Comment)   less than 1          Intake/Output Last 24 hours  Intake/Output Summary (Last 24 hours) at 05/14/2019  2328 Last data filed at 05/14/2019 1212 Gross per 24 hour  Intake 500 ml  Output -  Net 500 ml    Labs/Imaging Results for orders placed or performed during the hospital encounter of 05/14/19 (from the past 48 hour(s))  CBG monitoring, ED     Status: None   Collection Time: 05/14/19 11:33 AM  Result Value Ref Range   Glucose-Capillary 70 70 - 99 mg/dL  CBC with Differential/Platelet     Status: Abnormal   Collection Time: 05/14/19 11:39 AM  Result Value Ref Range   WBC 6.7 4.0 - 10.5 K/uL   RBC 3.45 (L) 4.22 - 5.81 MIL/uL   Hemoglobin 12.4 (L) 13.0 - 17.0 g/dL   HCT 08.6 (L) 57.8 - 46.9 %   MCV 103.8 (H) 80.0 - 100.0 fL   MCH 35.9 (H) 26.0 - 34.0 pg   MCHC 34.6 30.0 - 36.0 g/dL   RDW 62.9 52.8 - 41.3 %   Platelets 95 (L) 150 - 400 K/uL    Comment: REPEATED TO VERIFY PLATELET COUNT CONFIRMED BY SMEAR Immature Platelet Fraction may be clinically indicated, consider ordering this additional test [id number]    nRBC 0.0 0.0 - 0.2 %   Neutrophils Relative % 84 %   Neutro Abs 5.6 1.7 - 7.7 K/uL   Lymphocytes Relative 9 %   Lymphs Abs 0.6 (L) 0.7 - 4.0 K/uL   Monocytes Relative 6 %   Monocytes Absolute 0.4 0.1 -  1.0 K/uL   Eosinophils Relative 0 %   Eosinophils Absolute 0.0 0.0 - 0.5 K/uL   Basophils Relative 0 %   Basophils Absolute 0.0 0.0 - 0.1 K/uL   Immature Granulocytes 1 %   Abs Immature Granulocytes 0.04 0.00 - 0.07 K/uL    Comment: Performed at [facility], [address], [place], [place] [address]  Comprehensive metabolic panel     Status: Abnormal   Collection Time: 05/14/19 11:39 AM  Result Value Ref Range   Sodium 128 (L) 135 - 145 mmol/L   Potassium 3.1 (L) 3.5 - 5.1 mmol/L   Chloride 87 (L) 98 - 111 mmol/L   CO2 22 22 - 32 mmol/L   Glucose, Bld 69 (L) 70 - 99 mg/dL   BUN <5 (L) 8 - 23 mg/dL   Creatinine, Ser 7.89 0.61 - 1.24 mg/dL   Calcium 9.4 8.9 - 38.1 mg/dL   Total Protein 7.2 6.5 - 8.1 g/dL   Albumin 3.7 3.5 - 5.0 g/dL   AST 017 (H) 15 -  41 U/L   ALT 46 (H) 0 - 44 U/L   Alkaline Phosphatase 156 (H) 38 - 126 U/L   Total Bilirubin 2.0 (H) 0.3 - 1.2 mg/dL   GFR calc non Af Amer >60 >60 mL/min   GFR calc Af Amer >60 >60 mL/min   Anion gap 19 (H) 5 - 15    Comment: Performed at [facility], [address], [place], [place] [address]  Magnesium     Status: None   Collection Time: 05/14/19 11:39 AM  Result Value Ref Range   Magnesium 2.2 1.7 - 2.4 mg/dL    Comment: Performed at [facility], [address], [place], [place] [address]  Ethanol     Status: None   Collection Time: 05/14/19 11:39 AM  Result Value Ref Range   Alcohol, Ethyl (B) <10 <10 mg/dL    Comment: (NOTE) Lowest detectable limit for serum alcohol is 10 mg/dL. For medical purposes only. Performed at [facility], [address] [place], [place] [address]   POC CBG, ED     Status: None   Collection Time: 05/14/19  1:46 PM  Result Value Ref Range   Glucose-Capillary 74 70 - 99 mg/dL  SARS CORONAVIRUS 2 (TAT 6-24 HRS) Nasopharyngeal Nasopharyngeal Swab     Status: None   Collection Time: 05/14/19  4:55 PM   Specimen: Nasopharyngeal Swab  Result Value Ref Range   SARS Coronavirus 2 NEGATIVE NEGATIVE    Comment: (NOTE) SARS-CoV-2 target nucleic acids are NOT DETECTED. The SARS-CoV-2 RNA is generally detectable in upper and lower respiratory specimens during the acute phase of infection. Negative results do not preclude SARS-CoV-2 infection, do not rule out co-infections with other pathogens, and should not be used as the sole basis for treatment or other patient management decisions. Negative results must be combined with clinical observations, patient history, and epidemiological information. The expected result is Negative. Fact Sheet for Patients: HairSlick.no Fact Sheet for Healthcare Providers: quierodirigir.com This test is not yet approved or cleared by the [place]  FDA and  has been authorized for detection and/or diagnosis of SARS-CoV-2 by FDA under an Emergency Use Authorization (EUA). This EUA will remain  in effect (meaning this test can be used) for the duration of the COVID-19 declaration under Section 56 4(b)(1) of the Act, 21 U.S.C. section 360bbb-3(b)(1), unless the authorization is terminated or revoked sooner. Performed at  [facility], [address] [place], [place] [address]   Phosphorus     Status: None   Collection Time: 05/14/19  8:30 PM  Result Value Ref Range   Phosphorus 3.6 2.5 - 4.6 mg/dL    Comment: Performed at [facility], [address] [place], [place] [address]  Comprehensive metabolic panel     Status: Abnormal   Collection Time: 05/14/19  8:30 PM  Result Value Ref Range   Sodium 126 (L) 135 - 145 mmol/L   Potassium 3.5 3.5 - 5.1 mmol/L   Chloride 91 (L) 98 - 111 mmol/L   CO2 21 (L) 22 - 32 mmol/L   Glucose, Bld 133 (H) 70 - 99 mg/dL   BUN 6 (L) 8 - 23 mg/dL   Creatinine, Ser 0.981.21 0.61 - 1.24 mg/dL   Calcium 8.3 (L) 8.9 - 10.3 mg/dL   Total Protein 5.9 (L) 6.5 - 8.1 g/dL   Albumin 3.0 (L) 3.5 - 5.0 g/dL   AST 92 (H) 15 - 41 U/L   ALT 39 0 - 44 U/L   Alkaline Phosphatase 118 38 - 126 U/L   Total Bilirubin 1.7 (H) 0.3 - 1.2 mg/dL   GFR calc non Af Amer >60 >60 mL/min   GFR calc Af Amer >60 >60 mL/min   Anion gap 14 5 - 15    Comment: Performed at [facility], [address] [place], [place] [address]  HIV Antibody (routine testing w rflx)     Status: None   Collection Time: 05/14/19  9:23 PM  Result Value Ref Range   HIV Screen 4th Generation wRfx NON REACTIVE NON REACTIVE    Comment: Performed at [facility], [address] [place], [place] [address]   Ct Head Wo Contrast  Result Date: 05/14/2019 CLINICAL DATA:  Withdrawals.  Seizure, fall. EXAM: CT HEAD WITHOUT CONTRAST TECHNIQUE: Contiguous axial images were obtained from the base of the skull through the vertex without  intravenous contrast. COMPARISON:  03/29/2019 FINDINGS: Brain: There is atrophy and chronic small vessel disease changes. No acute intracranial abnormality. Specifically, no hemorrhage, hydrocephalus, mass lesion, acute infarction, or significant intracranial injury. Vascular: No hyperdense vessel or unexpected calcification. Skull: No acute calvarial abnormality. Sinuses/Orbits: Visualized paranasal sinuses and mastoids clear. Orbital soft tissues unremarkable. Other: None IMPRESSION: Atrophy, chronic microvascular disease. No acute intracranial abnormality. Electronically Signed   By: Charlett NoseKevin  Dover M.D.   On: 05/14/2019 12:06    Pending Labs Unresulted Labs (From admission, onward)    Start     Ordered   05/15/19 0500  CBC with Differential/Platelet  Tomorrow morning,   R     05/14/19 2058   05/15/19 0500  Comprehensive metabolic panel  Tomorrow morning,   R     05/14/19 2058   05/15/19 0500  Vitamin B12  Tomorrow morning,   R     05/14/19 2100   05/15/19 0500  Folate RBC  Tomorrow morning,   R     05/14/19 2100          Vitals/Pain Today's Vitals   05/14/19 2030 05/14/19 2041 05/14/19 2045 05/14/19 2100  BP: 125/74 125/74 109/72 102/68  Pulse:  95    Resp: (!) 27  19 14   Temp:      TempSrc:      SpO2:      Weight:      Height:      PainSc:  Isolation Precautions No active isolations  Medications Medications  thiamine (VITAMIN B-1) tablet 100 mg ( Oral See Alternative 05/14/19 1133)    Or  thiamine (B-1) injection 100 mg (100 mg Intravenous Not Given 05/14/19 1133)  LORazepam (ATIVAN) tablet 1-4 mg (has no administration in time range)    Or  LORazepam (ATIVAN) injection 1-4 mg (has no administration in time range)  folic acid injection 1 mg (1 mg Intravenous Given 05/14/19 2034)  enoxaparin (LOVENOX) injection 40 mg (has no administration in time range)  sodium chloride flush (NS) 0.9 % injection 3 mL (3 mLs Intravenous Given 05/14/19 2044)  chlordiazePOXIDE  (LIBRIUM) capsule 25 mg (25 mg Oral Given 05/14/19 2115)  0.9 %  sodium chloride infusion ( Intravenous New Bag/Given 05/14/19 2327)  LORazepam (ATIVAN) injection 1 mg (1 mg Intravenous Given 05/14/19 1133)  sodium chloride 0.9 % bolus 500 mL (0 mLs Intravenous Stopped 05/14/19 1212)  potassium chloride SA (KLOR-CON) CR tablet 40 mEq (40 mEq Oral Given 05/14/19 1422)  LORazepam (ATIVAN) injection 1 mg (1 mg Intravenous Given 05/14/19 1542)  sodium chloride 0.9 % bolus 500 mL (0 mLs Intravenous Stopped 05/14/19 1630)    Mobility Pt states he uses a cane when one is available, friend has one she lets him use sometimes High fall risk   Focused Assessments Neuro Assessment Handoff:  Swallow screen pass? Yes          Neuro Assessment:   Neuro Checks:      Last Documented NIHSS Modified Score:   Has TPA been given? No If patient is a Neuro Trauma and patient is going to OR before floor call report to [place] nurse: [phone] or [phone]     R Recommendations: See Admitting Provider Note  Report given to:   Additional Notes: n/a

## 2019-05-14 NOTE — ED Notes (Signed)
Checked patient cbg it was 35 notified RN of blood sugar patient is resting with call bell in reach

## 2019-05-14 NOTE — ED Notes (Signed)
Lunch Tray Ordered @ 1214-per Lexine Baton, RN called by Levada Dy

## 2019-05-14 NOTE — ED Notes (Signed)
Admit doctor at bedside

## 2019-05-14 NOTE — ED Triage Notes (Signed)
Pt arrives via EMS from hotel where patient had 2 42min seizures witnessed. Pt on arrival alert, appropriate, incontinent of stool. VSS. NAD at present. 22g L FA. CBG 166. ST 110 on ekg. bp in transport 136/100.

## 2019-05-14 NOTE — ED Provider Notes (Signed)
[facility] EMERGENCY DEPARTMENT Provider Note   CSN: [id number] Arrival date & time: 05/14/19  1029     History   Chief Complaint Chief Complaint  Patient presents with  . Seizures    HPI Larry Byrd is a 63 y.o. male.     Patient with history of chronic alcoholism, malnutrition, hypertension --presents to the emergency department from a hotel by EMS after having reported 2, 2-minute seizures.  Patient does not have a history of seizure disorder.  Patient does not recall events.  Most of history taken from EMS.  Patient reports falling.  He has a headache and tenderness over the left frontal area of his forehead and he thinks he may have fallen.  Reports drinking alcohol "when I can get it".  Reports last drank some beer yesterday.  Denies any neck pain, weakness, numbness, or tingling in the arms or the legs.  No recent nausea, vomiting, diarrhea.  Onset of symptoms acute.  Course is resolved.     Past Medical History:  Diagnosis Date  . Alcohol dependence (HCC)   . Diverticulosis   . Enlarged prostate   . Hypertension   . Major depression, chronic     Patient Active Problem List   Diagnosis Date Noted  . Malnutrition of moderate degree 09/13/2017  . Hypertension 09/10/2017  . AKI (acute kidney injury) (HCC) 09/10/2017  . Chest pain 09/10/2017  . Hyponatremia 09/10/2017  . Hypotension 09/10/2017  . Near syncope 09/10/2017  . Neck pain 09/10/2017    History reviewed. No pertinent surgical history.      Home Medications    Prior to Admission medications   Medication Sig Start Date End Date Taking? Authorizing Provider  amLODipine (NORVASC) 5 MG tablet Take 1 tablet (5 mg total) by mouth daily. Patient not taking: Reported on 03/29/2019 10/08/17   Donnetta Hutching, MD  FLUoxetine (PROZAC) 20 MG capsule Take 20 mg by mouth daily. 02/24/19   [provider]  lisinopril (ZESTRIL) 5 MG tablet Take 5 mg by mouth daily. 02/24/19   [provider]  lisinopril-hydrochlorothiazide (PRINZIDE,ZESTORETIC) 20-12.5 MG tablet Take 1 tablet by mouth daily. Patient not taking: Reported on 03/29/2019 08/11/17   Elson Areas, PA-C  meloxicam (MOBIC) 7.5 MG tablet Take 1 tablet (7.5 mg total) by mouth daily. 04/25/19   Gilda Crease, MD  Multiple Vitamin (MULTIVITAMIN WITH MINERALS) TABS tablet Take 1 tablet by mouth daily. Patient not taking: Reported on 10/08/2017 09/14/17   Kendell Bane, MD  potassium chloride SA (K-DUR,KLOR-CON) 20 MEQ tablet Take 2 tablets (40 mEq total) by mouth daily. Patient not taking: Reported on 03/29/2019 08/11/17   Osie Cheeks    Family History History reviewed. No pertinent family history.  Social History Social History   Tobacco Use  . Smoking status: Current Every Day Smoker    Types: Cigarettes  . Smokeless tobacco: Never Used  Substance Use Topics  . Alcohol use: Yes  . Drug use: No     Allergies   Patient has no known allergies.   Review of Systems Review of Systems  Constitutional: Negative for fever.  HENT: Negative for rhinorrhea and sore throat.   Eyes: Negative for redness.  Respiratory: Negative for cough.   Cardiovascular: Negative for chest pain.  Gastrointestinal: Negative for abdominal pain, diarrhea, nausea and vomiting.  Genitourinary: Negative for dysuria.  Musculoskeletal: Negative for myalgias.  Skin: Negative for rash.  Neurological: Positive for tremors, seizures and headaches. Negative  for facial asymmetry.     Physical Exam Updated Vital Signs BP (!) 158/94   Pulse (!) 117   Temp 98.8 F (37.1 C) (Oral)   Resp (!) 22   SpO2 100%   Physical Exam Vitals signs and nursing note reviewed.  Constitutional:      Appearance: He is well-developed.  HENT:     Head: Normocephalic.     Comments: Minimal abrasion to left forehead with tenderness to palpation over this area.    Mouth/Throat:     Comments: Tongue is normal without signs  of lateral tongue biting. Eyes:     General:        Right eye: No discharge.        Left eye: No discharge.     Conjunctiva/sclera: Conjunctivae normal.  Neck:     Musculoskeletal: Normal range of motion and neck supple.  Cardiovascular:     Rate and Rhythm: Regular rhythm. Tachycardia present.     Heart sounds: Normal heart sounds.     Comments: Tachycardia in the 110s without murmur. Pulmonary:     Effort: Pulmonary effort is normal.     Breath sounds: Normal breath sounds.  Abdominal:     Palpations: Abdomen is soft.     Tenderness: There is no abdominal tenderness.  Skin:    General: Skin is warm and dry.  Neurological:     General: No focal deficit present.     Mental Status: He is alert.     Comments: Patient is very tremulous.      ED Treatments / Results  Labs (all labs ordered are listed, but only abnormal results are displayed) Labs Reviewed  CBC WITH DIFFERENTIAL/PLATELET - Abnormal; Notable for the following components:      Result Value   RBC 3.45 (*)    Hemoglobin 12.4 (*)    HCT 35.8 (*)    MCV 103.8 (*)    MCH 35.9 (*)    Platelets 95 (*)    Lymphs Abs 0.6 (*)    All other components within normal limits  COMPREHENSIVE METABOLIC PANEL - Abnormal; Notable for the following components:   Sodium 128 (*)    Potassium 3.1 (*)    Chloride 87 (*)    Glucose, Bld 69 (*)    BUN <5 (*)    AST 143 (*)    ALT 46 (*)    Alkaline Phosphatase 156 (*)    Total Bilirubin 2.0 (*)    Anion gap 19 (*)    All other components within normal limits  MAGNESIUM  ETHANOL  CBG MONITORING, ED    EKG EKG Interpretation  Date/Time:  Monday May 14 2019 10:50:36 EDT Ventricular Rate:  113 PR Interval:    QRS Duration: 96 QT Interval:  340 QTC Calculation: 467 R Axis:   81 Text Interpretation:  Sinus tachycardia Non-specific ST-t changes Poor data quality Confirmed by Lajean Saver [id number]) on 05/14/2019 10:52:46 AM   Radiology Ct Head Wo Contrast  Result  Date: 05/14/2019 CLINICAL DATA:  Withdrawals.  Seizure, fall. EXAM: CT HEAD WITHOUT CONTRAST TECHNIQUE: Contiguous axial images were obtained from the base of the skull through the vertex without intravenous contrast. COMPARISON:  03/29/2019 FINDINGS: Brain: There is atrophy and chronic small vessel disease changes. No acute intracranial abnormality. Specifically, no hemorrhage, hydrocephalus, mass lesion, acute infarction, or significant intracranial injury. Vascular: No hyperdense vessel or unexpected calcification. Skull: No acute calvarial abnormality. Sinuses/Orbits: Visualized paranasal sinuses and mastoids clear. Orbital soft tissues  unremarkable. Other: None IMPRESSION: Atrophy, chronic microvascular disease. No acute intracranial abnormality. Electronically Signed   By: Charlett NoseKevin  Dover M.D.   On: 05/14/2019 12:06    Procedures Procedures (including critical care time)  Medications Ordered in ED Medications  LORazepam (ATIVAN) injection 0-4 mg (0 mg Intravenous Not Given 05/14/19 1124)    Or  LORazepam (ATIVAN) tablet 0-4 mg ( Oral See Alternative 05/14/19 1124)  LORazepam (ATIVAN) injection 0-4 mg (has no administration in time range)    Or  LORazepam (ATIVAN) tablet 0-4 mg (has no administration in time range)  thiamine (VITAMIN B-1) tablet 100 mg ( Oral See Alternative 05/14/19 1133)    Or  thiamine (B-1) injection 100 mg (100 mg Intravenous Not Given 05/14/19 1133)  LORazepam (ATIVAN) injection 1 mg (1 mg Intravenous Given 05/14/19 1133)  sodium chloride 0.9 % bolus 500 mL (0 mLs Intravenous Stopped 05/14/19 1212)     Initial Impression / Assessment and Plan / ED Course  I have reviewed the triage vital signs and the nursing notes.  Pertinent labs & imaging results that were available during my care of the patient were reviewed by me and considered in my medical decision making (see chart for details).        Patient seen and examined. Work-up initiated. Medications ordered.   Patient appears to be having alcohol withdrawals.  Suspect seizures were alcohol withdrawal seizures.  Will give Ativan, check CIWA score.  Will obtain head CT given that patient is a poor historian and has some left frontal tenderness.  Will obtain labs.  Patient may need admission.  Vital signs reviewed and are as follows: BP (!) 158/94   Pulse (!) 117   Temp 98.8 F (37.1 C) (Oral)   Resp (!) 22   SpO2 100%   12:57 PM Patient reassessed. He is sleeping. Food tray at bedside but he has not eaten. No tremors while sleeping. Is tremulous when he woke up. Will continue to monitor.  Reviewed head CT.  Negative for any acute injury.  Patient discussed with Dr. Denton LankSteinl who has seen patient.   5:08 PM patient monitored in the emergency department for several hours.  He continued to be tachycardic, CIWA equal 10.  Patient is in a poor social situation.  Will request admission for treatment of alcohol withdrawal in setting of alcohol withdrawal seizures today.  Discussed with internal medicine teaching service who will see.  Final Clinical Impressions(s) / ED Diagnoses   Final diagnoses:  Alcohol withdrawal seizure without complication (HCC)  Alcohol withdrawal syndrome without complication (HCC)  Hypokalemia   Admit for above.  ED Discharge Orders    None       Renne CriglerGeiple, Salle Brandle, PA-C 05/14/19 1709    Cathren LaineSteinl, Kevin, MD 05/20/19 [phone]

## 2019-05-14 NOTE — ED Notes (Signed)
Got patient undressed cleaned up on the monitor did ekg patient is resting with call bell in reach

## 2019-05-14 NOTE — H&P (Addendum)
Date: 05/14/2019               Patient Name:  Larry Byrd MRN: [id number]  DOB: 1956/02/18 Age / Sex: 63 y.o., male   PCP: Patient, No Pcp Per         Medical Service: Internal Medicine Teaching Service         Attending Physician: Dr. Earl Lagos, MD    First Contact: Huel Cote, MD, Cira Servant Pager: [phone])  Second Contact: Maryla Morrow, MD, Ellie Pager: [phone])       After Hours (After 5p/  First Contact Pager: [phone]  weekends / holidays): Second Contact Pager: [phone]   Chief Complaint: seizure   History of Present Illness: 63 y.o. yo male w/ PMH significant for HTN, alcohol abuse, who presents to the ED with c/o seizure. He was picked up by EMS after [facility]AR and FD staff witnessed 2 episodes of seizure-like activity that were several minutes apart. During these episodes, EMS report shows that patient was unresponsive.    [name] states that the last thing he remembers is taking a nap and then waking up in the ambulance on the way to the hospital. He states he has never had a seizure before and it was scary. He endorses drinking a few beers daily but not every day. His last drink was yesterday, couple beers. He denies any drug use. He denies headaches, dizziness, any pain, vision changes, CP, SOB. He does endorse water-y eyes and runny nose without a history of allergies. He cannot date how long these symptoms have been going on. He denies fever, chills, nausea, vomiting, sore throat.   ED course: Patient presented to the ED from a hotel via EMS after two 2-minute seizures. Suspected to be secondary to alcohol withdrawals. Patient given ativan for CIWA score of 10. CT Head without any acute intracranial abnormality noted. Patient admitted to internal medicine for further management of alcohol withdrawal.   Meds: No known outpatient medications.  No outpatient medications have been marked as taking for the 05/14/19 encounter [facility] Encounter).    Allergies: Allergies as of 05/14/2019  . (No Known Allergies)   Past Medical History:  Diagnosis Date  . Alcohol dependence (HCC)   . Diverticulosis   . Enlarged prostate   . Hypertension   . Major depression, chronic    Family History: History reviewed. No pertinent family history.   Social History:  Social History   Tobacco Use  . Smoking status: Current Every Day Smoker    Types: Cigarettes  . Smokeless tobacco: Never Used  Substance Use Topics  . Alcohol use: Yes  . Drug use: No    Review of Systems: A complete ROS was negative except as per HPI.   Physical Exam: Blood pressure 125/74, pulse 95, temperature 98.8 F (37.1 C), temperature source Oral, resp. rate 20, height 5\' 10"  (1.778 m), weight 52.1 kg, SpO2 100 %.  Physical Exam Vitals signs and nursing note reviewed.  Constitutional:      General: He is sleeping.     Appearance: He is cachectic.  HENT:     Mouth/Throat:     Mouth: Mucous membranes are moist.     Pharynx: Oropharynx is clear. No oropharyngeal exudate or posterior oropharyngeal erythema.  Eyes:     Extraocular Movements: Extraocular movements intact.     Conjunctiva/sclera:     Right eye: Right conjunctiva is injected.     Left eye: Left conjunctiva is injected.  Pupils: Pupils are equal, round, and reactive to light.  Cardiovascular:     Rate and Rhythm: Regular rhythm. Tachycardia present.     Heart sounds: No murmur.  Pulmonary:     Effort: Pulmonary effort is normal. No respiratory distress.     Breath sounds: No wheezing or rales.  Abdominal:     General: Bowel sounds are normal. There is no distension.     Palpations: Abdomen is soft.     Tenderness: There is no abdominal tenderness. There is no guarding.  Skin:    General: Skin is warm and dry.     Findings: Bruising (left frontal lobe bruise approximately 3x1 cm, without any laceration or abrasion) present.  Neurological:     General: No focal deficit present.      Mental Status: He is oriented to person, place, and time. He is lethargic.     GCS: GCS eye subscore is 4. GCS verbal subscore is 5. GCS motor subscore is 6.    EKG: personally reviewed my interpretation is sinus tachycardia w/HR 114; nonspecific ST-T wave changes; however, significant artifact on EKG secondary to movement  CT HEAD WO CONTRAST: no acute intracranial abnormality   Assessment & Plan by Problem: Active Problems:   Alcohol withdrawal seizure (HCC)  Larry Byrd is a 63 y/o male with a PMHx of alcohol abuse who is currently admitted for withdrawal seizure and alcohol detox.   # Seizure 2/2 Alcohol Withdrawal # Alcohol Abuse Although patient denies a significant history of alcohol use, per chart review, he has many documented visits for alcohol related reasons. He denies any past seizures and no other documented withdrawal in the chart. He received Ativan in the ED and plan to continue CIWA with Libirium to avoid further seizures.   - Seizure precaution  - CIWA with scheduled Librium and Ativan PRN  - Thiamine and Folic acid  - A.M. Lab Work: CMP, CBC   # Hypokalemia  # Hyponatremia  Secondary to lack of home and possible stable food resources, in addition to alcohol use. He has received 1 L bolus of NS while in the ED and will continue with IV maintenance fluids, NS at 50 cc/hr. He also received 40 mEq of Kdur. Will repeat lab work this PM and in the morning. Continue to replete as needed.   - IVF NS @ 50 cc/hr - CMP tonight stat for 2100  - CMP tomorrow A.M.   # Macrocytic Anemia Mild anemia but might be hemoconcentrated. His MCV has slightly improved from 2 weeks ago at which time was 110 and now only 103. Secondary to alcohol use disorder. Will check B12 and folate.   - Labs tomorrow: CBC, B12, Folate - Thiamine and Folic acid given today  # Transaminitis  # Thrombocytopenia  Possibly due to alcoholic hepatitis given his withdrawal symptoms point towards a  chronic and severe abuse history. AST is approximately 3x his ALT. Per chart review, his highest AST has been in the mid 200s back in 2019, but recently decreased to 87 in September 2020. Will trend LFTs while patient is admitted and monitor closely.   - Trend LFTs    Dispo: Admit patient to Inpatient with expected length of stay greater than 2 midnights.  Signed: Jose Persia MD Internal Medicine, PGY-1 05/14/2019, 8:46 PM  Pager: [phone]

## 2019-05-14 NOTE — ED Notes (Signed)
Checked patient cbg it was 77 notified RN Nikki of blood sugar patient is resting with call bell in reach

## 2019-05-15 DIAGNOSIS — F10139 Alcohol abuse with withdrawal, unspecified: Secondary | ICD-10-CM

## 2019-05-15 DIAGNOSIS — D696 Thrombocytopenia, unspecified: Secondary | ICD-10-CM

## 2019-05-15 DIAGNOSIS — E876 Hypokalemia: Secondary | ICD-10-CM

## 2019-05-15 DIAGNOSIS — G40509 Epileptic seizures related to external causes, not intractable, without status epilepticus: Secondary | ICD-10-CM

## 2019-05-15 DIAGNOSIS — D539 Nutritional anemia, unspecified: Secondary | ICD-10-CM

## 2019-05-15 DIAGNOSIS — R7401 Elevation of levels of liver transaminase levels: Secondary | ICD-10-CM

## 2019-05-15 DIAGNOSIS — I10 Essential (primary) hypertension: Secondary | ICD-10-CM

## 2019-05-15 DIAGNOSIS — E871 Hypo-osmolality and hyponatremia: Secondary | ICD-10-CM

## 2019-05-15 LAB — COMPREHENSIVE METABOLIC PANEL
ALT: 35 U/L (ref 0–44)
AST: 75 U/L — ABNORMAL HIGH (ref 15–41)
Albumin: 2.9 g/dL — ABNORMAL LOW (ref 3.5–5.0)
Alkaline Phosphatase: 112 U/L (ref 38–126)
Anion gap: 13 (ref 5–15)
BUN: 7 mg/dL — ABNORMAL LOW (ref 8–23)
CO2: 24 mmol/L (ref 22–32)
Calcium: 8.5 mg/dL — ABNORMAL LOW (ref 8.9–10.3)
Chloride: 95 mmol/L — ABNORMAL LOW (ref 98–111)
Creatinine, Ser: 1.13 mg/dL (ref 0.61–1.24)
GFR calc Af Amer: >60 mL/min (ref >60)
GFR calc non Af Amer: >60 mL/min (ref >60)
Glucose, Bld: 81 mg/dL (ref 70–99)
Potassium: 3.3 mmol/L — ABNORMAL LOW (ref 3.5–5.1)
Sodium: 132 mmol/L — ABNORMAL LOW (ref 135–145)
Total Bilirubin: 1.4 mg/dL — ABNORMAL HIGH (ref 0.3–1.2)
Total Protein: 5.7 g/dL — ABNORMAL LOW (ref 6.5–8.1)

## 2019-05-15 LAB — PROTIME-INR
INR: 1 (ref 0.8–1.2)
Prothrombin Time: 13.3 seconds (ref 11.4–15.2)

## 2019-05-15 LAB — CBC WITH DIFFERENTIAL/PLATELET
Abs Immature Granulocytes: 0.02 10*3/uL (ref 0.00–0.07)
Basophils Absolute: 0 10*3/uL (ref 0.0–0.1)
Basophils Relative: 0 %
Eosinophils Absolute: 0.1 10*3/uL (ref 0.0–0.5)
Eosinophils Relative: 2 %
HCT: 29 % — ABNORMAL LOW (ref 39.0–52.0)
Hemoglobin: 9.9 g/dL — ABNORMAL LOW (ref 13.0–17.0)
Immature Granulocytes: 1 %
Lymphocytes Relative: 23 %
Lymphs Abs: 1 10*3/uL (ref 0.7–4.0)
MCH: 36.1 pg — ABNORMAL HIGH (ref 26.0–34.0)
MCHC: 34.1 g/dL (ref 30.0–36.0)
MCV: 105.8 fL — ABNORMAL HIGH (ref 80.0–100.0)
Monocytes Absolute: 0.5 10*3/uL (ref 0.1–1.0)
Monocytes Relative: 12 %
Neutro Abs: 2.6 10*3/uL (ref 1.7–7.7)
Neutrophils Relative %: 62 %
Platelets: 92 10*3/uL — ABNORMAL LOW (ref 150–400)
RBC: 2.74 MIL/uL — ABNORMAL LOW (ref 4.22–5.81)
RDW: 12.4 % (ref 11.5–15.5)
WBC: 4.1 10*3/uL (ref 4.0–10.5)
nRBC: 0 % (ref 0.0–0.2)

## 2019-05-15 LAB — MAGNESIUM: Magnesium: 2.2 mg/dL (ref 1.7–2.4)

## 2019-05-15 LAB — VITAMIN B12: Vitamin B-12: 419 pg/mL (ref 180–914)

## 2019-05-15 MED ORDER — POTASSIUM CHLORIDE CRYS ER 20 MEQ PO TBCR
40.0000 meq | EXTENDED_RELEASE_TABLET | Freq: Once | ORAL | Status: AC
  Administered 2019-05-15: 40 meq via ORAL
  Filled 2019-05-15: qty 2

## 2019-05-15 MED ORDER — CHLORDIAZEPOXIDE HCL 25 MG PO CAPS
25.0000 mg | ORAL_CAPSULE | Freq: Two times a day (BID) | ORAL | Status: DC
  Administered 2019-05-15: 25 mg via ORAL
  Filled 2019-05-15: qty 1

## 2019-05-15 NOTE — TOC Initial Note (Signed)
Transition of Care [facility]) - Initial/Assessment Note    Patient Details  Name: Larry Byrd MRN: [id number] Date of Birth: 11-Sep-1955  Transition of Care [facility]) CM/SW Contact:    Pollie Friar, RN Phone Number: 05/15/2019, 2:24 PM  Clinical Narrative:                 Pt stays in motels or is homeless. Pt has medicaid but no PCP. Pt uses the bus for transportation. CM encouraged him to reach out to his Medicaid CM about arranging medicaid transport.  Pt asking about getting his check through Medicaid increased. CM informed him he needed to speak to his CM through Medicaid about this.  Pt requesting clothing at d/c. CM will see if the SW closet has some items that will fit him.  CM provided him outpatient/ inpatient/ on line resources for alcohol counseling.  TOC following.  Expected Discharge Plan: Home/Self Care Barriers to Discharge: Continued Medical Work up   Patient Goals and CMS Choice        Expected Discharge Plan and Services Expected Discharge Plan: Home/Self Care   Discharge Planning Services: CM Consult   Living arrangements for the past 2 months: Homeless                                      Prior Living Arrangements/Services Living arrangements for the past 2 months: Homeless Lives with:: Significant Other Patient language and need for interpreter reviewed:: Yes(no needs)              Criminal Activity/Legal Involvement Pertinent to Current Situation/Hospitalization: No - Comment as needed  Activities of Daily Living      Permission Sought/Granted                  Emotional Assessment Appearance:: Appears stated age Attitude/Demeanor/Rapport: Engaged Affect (typically observed): Accepting, Pleasant Orientation: : Oriented to Self, Oriented to Place, Oriented to  Time, Oriented to Situation   Psych Involvement: No (comment)  Admission diagnosis:  Hypokalemia [E87.6] Alcohol withdrawal seizure without complication ([name])  [K27.062, R56.9] Alcohol withdrawal syndrome without complication ([name]) [[name]] Patient Active Problem List   Diagnosis Date Noted  . Alcohol withdrawal seizure ([name]) 05/14/2019  . Malnutrition of moderate degree 09/13/2017  . Hypertension 09/10/2017  . AKI (acute kidney injury) ([name]) 09/10/2017  . Chest pain 09/10/2017  . Hyponatremia 09/10/2017  . Hypotension 09/10/2017  . Near syncope 09/10/2017  . Neck pain 09/10/2017   PCP:  Patient, No Pcp Per Pharmacy:   [facility] ([place] [place] - [address] [place] ([place]) [place] [address] Phone: [phone] Fax: [phone]     Social Determinants of Health (SDOH) Interventions    Readmission Risk Interventions No flowsheet data found.

## 2019-05-15 NOTE — Progress Notes (Signed)
  Date: 05/15/2019  Patient name: Asheton Scheffler  Medical record number: [id number]  Date of birth: [birth year]   I have seen and evaluated Aris [place] and discussed their care with the Residency Team.  In brief, patient is 63 year old male with past medical history of hypertension, alcohol use disorder who presented to the ED with new onset seizure.  Patient unable to provide a clear history and does not remember the seizure.  History obtained from chart.  Patient was picked up by EMS after having 2 witnessed episodes of seizure-like activity that was several minutes apart.  Patient was unresponsive during these episodes.  Patient does admit to drinking beer but he is unclear as to how beer he is drinking as well as how often he drinks.  He states that he drinks 40 ounce cans of beer.  Patient states that when he is not drinking his hands shake and he has difficulty in bringing a fork to his mouth.  Patient stated his last drink was the day prior to his admission.  No headache, no blurry vision, no focal weakness, tingling or numbness, no abdominal pain, diarrhea, no chest pain, no shortness of breath, no palpitations, no diaphoresis, no fevers or chills.  Today patient states that he has no tremors and that he feels better except for some pain from rib fractures from a prior fall.  PMHx, Fam Hx, and/or Soc Hx : As per resident note  Vitals:   05/15/19 1000 05/15/19 1204  BP: 129/75 103/63  Pulse:  81  Resp: 18 17  Temp:  97.6 F (36.4 C)  SpO2:  100%   General: Awake, alert, oriented x3, NAD CVS: Regular rate and rhythm, normal heart sounds Lungs: CTA laterally Abdomen: Soft, nontender, nondistended, normoactive bowel sounds Extremities: No edema noted, nontender to palpation HEENT: Normocephalic, atraumatic Neuro: Oriented x3, no tremors noted Psych: Normal mood and affect  Assessment and Plan: I have seen and evaluated the patient as outlined above. I agree with the formulated  Assessment and Plan as detailed in the residents' note, with the following changes:   1.  New onset seizures likely secondary to alcohol withdrawal: -Patient presented to the ED with 2 episodes of witnessed seizures as an outpatient and does not remember these episodes.  Patient does endorse a history of alcohol use and states that he does have withdrawal symptoms like tremors when he stopped taking alcohol.  I suspect that the seizures were likely secondary to alcohol withdrawal. -Patient is currently doing well on Librium.  Will decrease to 25 mg twice daily today.  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  Will monitor CBC closely -No further work-up at this time  Aldine Contes, MD 10/20/20203:39 PM

## 2019-05-15 NOTE — Progress Notes (Addendum)
  Subjective:  Patient stated that he was feeling much better this morning. He did not recall the prior days events nor the CT head. He endorses a heavy EtOH use history of marked withdrawal symptoms frequently. He denied pain or hallucinations.   Objective:  Vital signs in last 24 hours: Vitals:   05/14/19 2045 05/14/19 2100 05/15/19 0006 05/15/19 0320  BP: 109/72 102/68 118/74 111/77  Pulse:   79 77  Resp: 19 14 14 18   Temp:    98 F (36.7 C)  TempSrc:    Axillary  SpO2:   100% 100%  Weight:      Height:       General: A/O x4, in no acute distress, afebrile, nondiaphoretic Neuro: No noticeable tremor on exam today Cardio: RRR, no mrg's  Pulmonary: CTA bilaterally, no wheezing or crackles  Abdomen: Bowel sounds normal, soft, nontender  MSK: BLE nontender, nonedematous Psych: Appropriate affect, not depressed in appearance, engages well  Assessment/Plan:  Active Problems:   Alcohol withdrawal seizure (HCC)  Larry Byrd is a 63 y/o male with a PMHx of alcohol abuse who is currently admitted for withdrawal seizure and alcohol detoxification.   Seizure 2/2 Alcohol Withdrawal: Alcohol Use disorder: Today the patient endorsed a prominent EtOH use history with tremor and withdrawal symptoms whenever he stops consuming EtOH for greater than 24-48 hours. This is a regular event.  He has responded well to librium thus far with well controlled CIWA scores. There has been little need for ativan.We will hold off on an MRI given the high probability of EtOH relation and lack of concerning abnormality on CT head. - Continue seizure precautions - Continue CIWA Ativan PRN per protocol - Continue scheduled Librium now Q12 hours - Continue Thiamine and Folic acid  - A.M. Lab Work: CMP, CBC   # Hypokalemia: # Hyponatremia:  Likely 2/2 to food resource limitations and EtOH use disorder. He has marked improvement in his sodium but required replacement of his potassium again today as it  had fallen to 3.3. - Advancing diet to regular - Discontinue IV fluids - AM CMP  # Macrocytic Anemia: Mild macrocytic anemia. MCV has slightly improved from 2 weeks ago at which time he was 110 and now only 103. Most likely 2/2 to alcohol use disorder. B12 WNL's and folate pending.  - Repleted Folic acid - F/up folate level  # Transaminitis: # Thrombocytopenia: Likely due to chronic excess EtOH use. AST is approximately 3x his ALT. Per chart review, his highest AST has been in the mid 200s back in 2019, but recently decreased to 49 in September 2020.  - Monitor for now  Diet: Regular Code: Full DVT PPX: Enoxaparin Dispo: Anticipated discharge pending medical improvement.   Kathi Ludwig, MD [facility] Internal Medicine, PGY-3 Pager # [phone]  05/15/2019, 7:24 AM

## 2019-05-15 NOTE — Plan of Care (Signed)
Progressing towards goals

## 2019-05-16 ENCOUNTER — Other Ambulatory Visit: Payer: Self-pay

## 2019-05-16 DIAGNOSIS — R64 Cachexia: Secondary | ICD-10-CM

## 2019-05-16 LAB — COMPREHENSIVE METABOLIC PANEL
ALT: 33 U/L (ref 0–44)
AST: 71 U/L — ABNORMAL HIGH (ref 15–41)
Albumin: 2.6 g/dL — ABNORMAL LOW (ref 3.5–5.0)
Alkaline Phosphatase: 96 U/L (ref 38–126)
Anion gap: 7 (ref 5–15)
BUN: 8 mg/dL (ref 8–23)
CO2: 26 mmol/L (ref 22–32)
Calcium: 8.8 mg/dL — ABNORMAL LOW (ref 8.9–10.3)
Chloride: 103 mmol/L (ref 98–111)
Creatinine, Ser: 0.93 mg/dL (ref 0.61–1.24)
GFR calc Af Amer: >60 mL/min (ref >60)
GFR calc non Af Amer: >60 mL/min (ref >60)
Glucose, Bld: 107 mg/dL — ABNORMAL HIGH (ref 70–99)
Potassium: 3.5 mmol/L (ref 3.5–5.1)
Sodium: 136 mmol/L (ref 135–145)
Total Bilirubin: 0.9 mg/dL (ref 0.3–1.2)
Total Protein: 5.5 g/dL — ABNORMAL LOW (ref 6.5–8.1)

## 2019-05-16 LAB — CBC
HCT: 28.7 % — ABNORMAL LOW (ref 39.0–52.0)
Hemoglobin: 9.7 g/dL — ABNORMAL LOW (ref 13.0–17.0)
MCH: 36.7 pg — ABNORMAL HIGH (ref 26.0–34.0)
MCHC: 33.8 g/dL (ref 30.0–36.0)
MCV: 108.7 fL — ABNORMAL HIGH (ref 80.0–100.0)
Platelets: 106 10*3/uL — ABNORMAL LOW (ref 150–400)
RBC: 2.64 MIL/uL — ABNORMAL LOW (ref 4.22–5.81)
RDW: 12.5 % (ref 11.5–15.5)
WBC: 3.2 10*3/uL — ABNORMAL LOW (ref 4.0–10.5)
nRBC: 0 % (ref 0.0–0.2)

## 2019-05-16 LAB — FOLATE RBC
Folate, Hemolysate: 281 ng/mL
Folate, RBC: 956 ng/mL (ref >498)
Hematocrit: 29.4 % — ABNORMAL LOW (ref 37.5–51.0)

## 2019-05-16 MED ORDER — ENSURE ENLIVE PO LIQD
237.0000 mL | Freq: Three times a day (TID) | ORAL | Status: DC
  Administered 2019-05-16 – 2019-05-21 (×15): 237 mL via ORAL

## 2019-05-16 MED ORDER — FOLIC ACID 1 MG PO TABS
1.0000 mg | ORAL_TABLET | Freq: Every day | ORAL | Status: DC
  Administered 2019-05-17 – 2019-05-21 (×5): 1 mg via ORAL
  Filled 2019-05-16 (×5): qty 1

## 2019-05-16 MED ORDER — CHLORDIAZEPOXIDE HCL 25 MG PO CAPS
25.0000 mg | ORAL_CAPSULE | Freq: Three times a day (TID) | ORAL | Status: DC
  Administered 2019-05-16 (×3): 25 mg via ORAL
  Filled 2019-05-16 (×3): qty 1

## 2019-05-16 NOTE — TOC Progression Note (Addendum)
Transition of Care [facility]) - Progression Note    Patient Details  Name: Larry Byrd MRN: [id number] Date of Birth: 08-31-1955  [facility]) CM/SW Contact  Pollie Friar, RN Phone Number: 05/16/2019, 11:43 AM  Clinical Narrative:    CM provide him numbers for Medicaid transportation and for SCAT.  CM provided a pair of jeans in his room for d/c as per his request.  Pt set up with [facility] for PCP and hospital follow up. TOC following.   Expected Discharge Plan: Home/Self Care Barriers to Discharge: Continued Medical Work up  Expected Discharge Plan and Services Expected Discharge Plan: Home/Self Care   Discharge Planning Services: CM Consult   Living arrangements for the past 2 months: Homeless                                       Social Determinants of Health (SDOH) Interventions    Readmission Risk Interventions No flowsheet data found.

## 2019-05-16 NOTE — Plan of Care (Signed)
Patient progressing towards plan of care goals. 

## 2019-05-16 NOTE — Evaluation (Signed)
Physical Therapy Evaluation Patient Details Name: Larry Byrd MRN: [id number] DOB: 07/29/1955 Today's Date: 05/16/2019   History of Present Illness  63 y.o. yo male w/ PMH significant for HTN, alcohol abuse, who presents to the ED with c/o seizure secondary to alcohol withdrawal. Also with noted hypokalemia, hyponatremia, and transaminitis.  Clinical Impression  Pt admitted with above diagnosis. Prior to admission, pt is homeless. On PT evaluation, pt requiring moderate assist to stand. Very unsteady without an assistive device but improved with use of walker for limited hallway distances. Presents with generalized weakness, decreased endurance, gait abnormalities, pain, and balance impairments. Presents as a high fall risk based on recurrent falls and decreased gait speed.  Pt currently with functional limitations due to the deficits listed below (see PT Problem List). Pt will benefit from skilled PT to increase their independence and safety with mobility to allow discharge to the venue listed below.       Follow Up Recommendations SNF    Equipment Recommendations  Rolling walker with 5" wheels    Recommendations for Other Services       Precautions / Restrictions Precautions Precautions: Fall Restrictions Weight Bearing Restrictions: No      Mobility  Bed Mobility Overal bed mobility: Needs Assistance Bed Mobility: Supine to Sit;Sit to Supine     Supine to sit: Supervision Sit to supine: Supervision      Transfers Overall transfer level: Needs assistance Equipment used: None Transfers: Sit to/from Stand Sit to Stand: Mod assist         General transfer comment: ModA to boost to stand  Ambulation/Gait Ambulation/Gait assistance: Min guard;Mod assist Gait Distance (Feet): 75 Feet(75', 75') Assistive device: Rolling walker (2 wheeled) Gait Pattern/deviations: Step-through pattern;Decreased stride length;Narrow base of support;Decreased dorsiflexion -  right;Decreased dorsiflexion - left Gait velocity: decreased Gait velocity interpretation: <1.31 ft/sec, indicative of household ambulator General Gait Details: Initially requiring modA for stability with no AD, progressing to min guard with use of walker. Very slow speed  Stairs            Wheelchair Mobility    Modified Rankin (Stroke Patients Only)       Balance Overall balance assessment: Needs assistance Sitting-balance support: Feet supported Sitting balance-Leahy Scale: Good     Standing balance support: No upper extremity supported;During functional activity Standing balance-Leahy Scale: Poor Standing balance comment: reliant on external support                             Pertinent Vitals/Pain Pain Assessment: Faces Faces Pain Scale: Hurts even more Pain Location: "right side," shoulders, head Pain Descriptors / Indicators: Grimacing Pain Intervention(s): Limited activity within patient's tolerance;Monitored during session    Home Living Family/patient expects to be discharged to:: Unsure                 Additional Comments: Pt is homeless    Prior Function Level of Independence: Independent               Hand Dominance        Extremity/Trunk Assessment   Upper Extremity Assessment Upper Extremity Assessment: Generalized weakness    Lower Extremity Assessment Lower Extremity Assessment: Generalized weakness    Cervical / Trunk Assessment Cervical / Trunk Assessment: Kyphotic  Communication   Communication: No difficulties  Cognition Arousal/Alertness: Awake/alert Behavior During Therapy: WFL for tasks assessed/performed Overall Cognitive Status: Within Functional Limits for tasks assessed  General Comments: WFL for basic mobility tasks      General Comments      Exercises     Assessment/Plan    PT Assessment Patient needs continued PT services  PT Problem List  Decreased strength;Decreased activity tolerance;Decreased balance;Decreased mobility;Pain       PT Treatment Interventions DME instruction;Gait training;Functional mobility training;Therapeutic activities;Therapeutic exercise;Balance training;Patient/family education    PT Goals (Current goals can be found in the Care Plan section)  Acute Rehab PT Goals Patient Stated Goal: "go to rehab." PT Goal Formulation: With patient Time For Goal Achievement: 05/30/19 Potential to Achieve Goals: Fair    Frequency Min 2X/week   Barriers to discharge        Co-evaluation               AM-PAC PT "6 Clicks" Mobility  Outcome Measure Help needed turning from your back to your side while in a flat bed without using bedrails?: None Help needed moving from lying on your back to sitting on the side of a flat bed without using bedrails?: None Help needed moving to and from a bed to a chair (including a wheelchair)?: A Little Help needed standing up from a chair using your arms (e.g., wheelchair or bedside chair)?: A Lot Help needed to walk in hospital room?: A Little Help needed climbing 3-5 steps with a railing? : A Lot 6 Click Score: 18    End of Session Equipment Utilized During Treatment: Gait belt Activity Tolerance: Patient tolerated treatment well Patient left: in bed;with call bell/phone within reach;with bed alarm set Nurse Communication: Mobility status PT Visit Diagnosis: Unsteadiness on feet (R26.81);Muscle weakness (generalized) (M62.81);Difficulty in walking, not elsewhere classified (R26.2)    Time: 4270-6237 PT Time Calculation (min) (ACUTE ONLY): 19 min   Charges:   PT Evaluation $PT Eval Moderate Complexity: 1 Mod          Ellamae Sia, [place], DPT Acute Rehabilitation Services Pager [phone] Office [phone]   Willy Eddy 05/16/2019, 5:19 PM

## 2019-05-16 NOTE — Progress Notes (Signed)
Subjective:   Larry Byrd reports he is trying to catch up on his rest. He is continuing to have headache that is worse on the left side where he hit his head. He also feels like he has some swelling in that area. He says the Librium is helping with the tremors though.   Objective:  Vital signs in last 24 hours: Vitals:   05/15/19 1748 05/15/19 1928 05/15/19 2317 05/16/19 0421  BP: 111/71 107/73 111/76 108/60  Pulse: 79 79  64  Resp: 18 (!) 22  20  Temp: 98.5 F (36.9 C) 99.5 F (37.5 C) 98.9 F (37.2 C) 98 F (36.7 C)  TempSrc: Oral Oral Oral Oral  SpO2: 100% 100%    Weight:      Height:       Physical Exam Vitals signs and nursing note reviewed.  Constitutional:      Appearance: He is cachectic.  HENT:     Head: Normocephalic.  Eyes:     Conjunctiva/sclera:     Right eye: Right conjunctiva is injected.     Left eye: Left conjunctiva is injected.   Cardiovascular:     Rate and Rhythm: Normal rate and regular rhythm.     Heart sounds: No murmur.  Pulmonary:     Effort: Pulmonary effort is normal. No respiratory distress.     Breath sounds: Normal breath sounds. No wheezing, rhonchi or rales.  Abdominal:     General: There is no distension.     Palpations: Abdomen is soft.     Tenderness: There is no abdominal tenderness. There is no guarding.  Skin:    General: Skin is warm and dry.     Coloration: Skin is not jaundiced.     Findings: Bruising (left frontotemporal bruise without abrasion or laceration) present.  Neurological:     Mental Status: He is alert and oriented to person, place, and time.     Comments: Minimal tremors with motion only, not present at rest  Psychiatric:        Mood and Affect: Mood normal.        Behavior: Behavior normal. Behavior is cooperative.    Assessment/Plan:  Active Problems:   Alcohol withdrawal seizure (HCC)  Larry Byrd is a 63 y/o male with a PMHx of alcohol abuse who is currently admitted for withdrawal seizure  and alcohol detoxification.   # Seizure 2/2 Alcohol Withdrawal: # Alcohol Use disorder: Once post-ctal state resolved, patient endorsed a prominent EtOH use history with tremor and withdrawal symptoms whenever he stops consuming EtOH for greater than 24-48 hours. This is a regular event.   CIWA scores have risen very quickly from 0 to 14 in the past 12 hours. His Librium was decreased to BID yesterday but will increase to TID. He has required two doses of Ativan. He did not have a drug screen during this admission but appears to have had one a couple weeks ago that was negative, so that lowers the concern that he is withdrawing from something else. Odd that CIWA scores are just increasing 24 hours after he had a alcohol withdrawal seizure if this is alcohol withdrawal related symptoms. Will continue to monitor closely.   - Continue seizure precautions - Continue CIWA Ativan PRN per protocol - Continue scheduled Librium TID - Continue Thiamine and Folic acid  - A.M. Lab Work: BMP, CBC   # Hypokalemia: # Hyponatremia:  Likely 2/2 to food resource limitations and EtOH use disorder. He has marked improvement  in his sodium and potassium. Will add Ensure to diet as well.   - Advancing diet to regular  # Macrocytic Anemia: # Thrombocytopenia # Pancytopenia  Secondary to long standing, severe alcohol use leading to bone marrow suppresion suspected. B12 WNL. Folate pending.   - CBC daily - F/U folate level - Continue Folic acid repletion   # Transaminitis: Likely due to chronic excess EtOH use. AST is approximately 3x his ALT on admission but has been trending downwards now. Per chart review, his highest AST has been in the mid 200s back in 2019, but recently decreased to 41 in September 2020. He has been improving during this admission. Albumin is also very low concerning for liver disease but INR/PT are unremarkable, which is reassuring.   - Monitor for now   Diet: Regular Code: Full  DVT PPX: Enoxaparin  Dispo: Anticipated discharge pending medical improvement.   Dr. Verdene Lennert Internal Medicine PGY-1  Pager: [phone] 05/16/2019, 6:41 AM

## 2019-05-17 LAB — BASIC METABOLIC PANEL
Anion gap: 10 (ref 5–15)
BUN: 15 mg/dL (ref 8–23)
CO2: 29 mmol/L (ref 22–32)
Calcium: 9.7 mg/dL (ref 8.9–10.3)
Chloride: 100 mmol/L (ref 98–111)
Creatinine, Ser: 1.05 mg/dL (ref 0.61–1.24)
GFR calc Af Amer: >60 mL/min (ref >60)
GFR calc non Af Amer: >60 mL/min (ref >60)
Glucose, Bld: 112 mg/dL — ABNORMAL HIGH (ref 70–99)
Potassium: 3.7 mmol/L (ref 3.5–5.1)
Sodium: 139 mmol/L (ref 135–145)

## 2019-05-17 LAB — CBC WITH DIFFERENTIAL/PLATELET
Abs Immature Granulocytes: 0.02 10*3/uL (ref 0.00–0.07)
Basophils Absolute: 0 10*3/uL (ref 0.0–0.1)
Basophils Relative: 1 %
Eosinophils Absolute: 0.1 10*3/uL (ref 0.0–0.5)
Eosinophils Relative: 3 %
HCT: 29.6 % — ABNORMAL LOW (ref 39.0–52.0)
Hemoglobin: 9.7 g/dL — ABNORMAL LOW (ref 13.0–17.0)
Immature Granulocytes: 1 %
Lymphocytes Relative: 30 %
Lymphs Abs: 0.9 10*3/uL (ref 0.7–4.0)
MCH: 35.9 pg — ABNORMAL HIGH (ref 26.0–34.0)
MCHC: 32.8 g/dL (ref 30.0–36.0)
MCV: 109.6 fL — ABNORMAL HIGH (ref 80.0–100.0)
Monocytes Absolute: 0.5 10*3/uL (ref 0.1–1.0)
Monocytes Relative: 16 %
Neutro Abs: 1.6 10*3/uL — ABNORMAL LOW (ref 1.7–7.7)
Neutrophils Relative %: 49 %
Platelets: 129 10*3/uL — ABNORMAL LOW (ref 150–400)
RBC: 2.7 MIL/uL — ABNORMAL LOW (ref 4.22–5.81)
RDW: 12.4 % (ref 11.5–15.5)
WBC: 3.1 10*3/uL — ABNORMAL LOW (ref 4.0–10.5)
nRBC: 0 % (ref 0.0–0.2)

## 2019-05-17 MED ORDER — INFLUENZA VAC SPLIT QUAD 0.5 ML IM SUSY
0.5000 mL | PREFILLED_SYRINGE | INTRAMUSCULAR | Status: AC
  Administered 2019-05-18: 0.5 mL via INTRAMUSCULAR
  Filled 2019-05-17: qty 0.5

## 2019-05-17 MED ORDER — CHLORDIAZEPOXIDE HCL 25 MG PO CAPS
25.0000 mg | ORAL_CAPSULE | Freq: Two times a day (BID) | ORAL | Status: DC
  Administered 2019-05-17 (×2): 25 mg via ORAL
  Filled 2019-05-17 (×2): qty 1

## 2019-05-17 MED ORDER — ACETAMINOPHEN 325 MG PO TABS
650.0000 mg | ORAL_TABLET | Freq: Four times a day (QID) | ORAL | Status: DC | PRN
  Administered 2019-05-17 – 2019-05-21 (×5): 650 mg via ORAL
  Filled 2019-05-17 (×5): qty 2

## 2019-05-17 NOTE — Plan of Care (Signed)
  Problem: Clinical Measurements: Goal: Ability to maintain clinical measurements within normal limits will improve Outcome: Progressing   Problem: Clinical Measurements: Goal: Will remain free from infection Outcome: Progressing   Problem: Clinical Measurements: Goal: Diagnostic test results will improve Outcome: Progressing   Problem: Health Behavior/Discharge Planning: Goal: Ability to manage health-related needs will improve Outcome: Progressing   

## 2019-05-17 NOTE — Plan of Care (Signed)
Progressing towards plan of care goals. 

## 2019-05-17 NOTE — TOC Progression Note (Signed)
Transition of Care [facility]) - Progression Note    Patient Details  Name: Aryaan Persichetti MRN: [id number] Date of Birth: 06-Sep-1955  [facility]) CM/SW Contact  Pollie Friar, RN Phone Number: 05/17/2019, 3:27 PM  Clinical Narrative:    Recommendations are for SNF. Pt is concerned about his friend that stays with him being out at the bus stop alone. CM will attempt to reach pts friend: Prentiss Bells. He wants to see her before making a decision about SNF rehab. Pt would have to give up his check for the month except $30 for the rehab and stay for 30 days.  TOC following.   Expected Discharge Plan: Home/Self Care Barriers to Discharge: Continued Medical Work up  Expected Discharge Plan and Services Expected Discharge Plan: Home/Self Care   Discharge Planning Services: CM Consult   Living arrangements for the past 2 months: Homeless                                       Social Determinants of Health (SDOH) Interventions    Readmission Risk Interventions No flowsheet data found.

## 2019-05-17 NOTE — Progress Notes (Signed)
  Subjective:   Larry Byrd reports he is doing okay today. He was resting prior to our visit this AM. His withdrawal symptoms are improving, especially the tremors when compared to arrival. He is still having left frontal headache where he hit his head during the seizure. No other acute complaints at this time.   He is concerned about going to a SNF without talking to his lady friend first. He states that she is currently sleeping at a bus stop near [employer]. He does not believe her phone is currently working unfortunately. He is okay with getting social work involved to help reach out to her.   Objective:  Vital signs in last 24 hours: Vitals:   05/16/19 1714 05/16/19 1941 05/16/19 2321 05/17/19 0402  BP:  (!) 150/103 110/61 118/73  Pulse: 78 73    Resp:  11 (!) 24 19  Temp: 98.4 F (36.9 C) 97.9 F (36.6 C) 97.8 F (36.6 C) 98.4 F (36.9 C)  TempSrc: Oral Oral Oral Oral  SpO2: 100% 100% 98% 100%  Weight:      Height:       Physical Exam Vitals signs and nursing note reviewed.  Constitutional:      Appearance: He is cachectic.  Cardiovascular:     Rate and Rhythm: Normal rate and regular rhythm.     Heart sounds: No murmur.  Pulmonary:     Effort: Pulmonary effort is normal. No respiratory distress.  Skin:    General: Skin is warm and dry.  Neurological:     Mental Status: He is alert and oriented to person, place, and time.  Psychiatric:        Mood and Affect: Mood normal.        Behavior: Behavior normal.    Assessment/Plan:  Active Problems:   Alcohol withdrawal seizure (HCC)  Tully Mcinturff is a 63 y/o male with a PMHx of alcohol abuse who is currently admitted for withdrawal seizure and alcohol detoxification.   # Seizure 2/2 Alcohol Withdrawal: # Alcohol Use disorder: Only CIWA score in 24 hour period was this morning and scored 2. Will discuss with nursing regarding checking it every 4 hours, given his high risk of withdrawal. Will continue  to monitor for withdrawal symptoms while we continue tapering Librium.   - Continue seizure precautions - Continue CIWA Ativan PRN per protocol - Continue scheduled Librium BID - Continue Thiamine and Folic acid  - A.M. Lab Work: BMP, CBC   # Macrocytic Anemia: # Thrombocytopenia # Pancytopenia  Secondary to long standing, severe alcohol use leading to bone marrow suppresion suspected. B12 and folate are unremarkable further supporting bone marrow suppression.   - CBC  - Continue Folic acid repletion   # Transaminitis: Likely due to chronic excess EtOH use. AST is approximately 3x his ALT on admission but has been trending downwards now. Per chart review, his highest AST has been in the mid 200s back in 2019, but recently decreased to 52 in September 2020. He has been improving during this admission. Albumin is also very low concerning for liver disease but INR/PT are unremarkable, which is reassuring.   - No intervention required at this time.   # Hypokalemia: Resolved  # Hyponatremia: Resolved  Diet: Regular Code: Full DVT PPX: Enoxaparin  Dispo: Anticipated discharge pending medical improvement and SNF placement.   Dr. Jose Persia Internal Medicine PGY-1  Pager: [phone] 05/17/2019, 6:55 AM

## 2019-05-18 LAB — CBC WITH DIFFERENTIAL/PLATELET
Abs Immature Granulocytes: 0.01 10*3/uL (ref 0.00–0.07)
Basophils Absolute: 0.1 10*3/uL (ref 0.0–0.1)
Basophils Relative: 2 %
Eosinophils Absolute: 0.1 10*3/uL (ref 0.0–0.5)
Eosinophils Relative: 4 %
HCT: 29.7 % — ABNORMAL LOW (ref 39.0–52.0)
Hemoglobin: 9.9 g/dL — ABNORMAL LOW (ref 13.0–17.0)
Immature Granulocytes: 0 %
Lymphocytes Relative: 42 %
Lymphs Abs: 1.3 10*3/uL (ref 0.7–4.0)
MCH: 36.1 pg — ABNORMAL HIGH (ref 26.0–34.0)
MCHC: 33.3 g/dL (ref 30.0–36.0)
MCV: 108.4 fL — ABNORMAL HIGH (ref 80.0–100.0)
Monocytes Absolute: 0.7 10*3/uL (ref 0.1–1.0)
Monocytes Relative: 23 %
Neutro Abs: 0.9 10*3/uL — ABNORMAL LOW (ref 1.7–7.7)
Neutrophils Relative %: 29 %
Platelets: 180 10*3/uL (ref 150–400)
RBC: 2.74 MIL/uL — ABNORMAL LOW (ref 4.22–5.81)
RDW: 12.6 % (ref 11.5–15.5)
WBC: 3 10*3/uL — ABNORMAL LOW (ref 4.0–10.5)
nRBC: 0.7 % — ABNORMAL HIGH (ref 0.0–0.2)

## 2019-05-18 LAB — BASIC METABOLIC PANEL
Anion gap: 10 (ref 5–15)
BUN: 14 mg/dL (ref 8–23)
CO2: 30 mmol/L (ref 22–32)
Calcium: 10.3 mg/dL (ref 8.9–10.3)
Chloride: 97 mmol/L — ABNORMAL LOW (ref 98–111)
Creatinine, Ser: 0.87 mg/dL (ref 0.61–1.24)
GFR calc Af Amer: >60 mL/min (ref >60)
GFR calc non Af Amer: >60 mL/min (ref >60)
Glucose, Bld: 118 mg/dL — ABNORMAL HIGH (ref 70–99)
Potassium: 3.6 mmol/L (ref 3.5–5.1)
Sodium: 137 mmol/L (ref 135–145)

## 2019-05-18 LAB — PATHOLOGIST SMEAR REVIEW

## 2019-05-18 LAB — MAGNESIUM: Magnesium: 1.7 mg/dL (ref 1.7–2.4)

## 2019-05-18 MED ORDER — CHLORDIAZEPOXIDE HCL 25 MG PO CAPS
25.0000 mg | ORAL_CAPSULE | Freq: Every day | ORAL | Status: DC
  Administered 2019-05-18 – 2019-05-19 (×2): 25 mg via ORAL
  Filled 2019-05-18 (×2): qty 1

## 2019-05-18 NOTE — NC FL2 (Signed)
[place] LEVEL OF CARE SCREENING TOOL     IDENTIFICATION  Patient Name: Larry Byrd Birthdate: Dec 23, 1955 Sex: male Admission Date (Current Location): 05/14/2019  [facility] and [place] Number:  Herbalist and Address:  [facility] [place], [place] [address] [place], [place] [address]      Provider Number: [id number]  Attending Physician Name and Address:  Aldine Contes, MD  Relative Name and Phone Number:       Current Level of Care: Hospital Recommended Level of Care: [place] Prior Approval Number:    Date Approved/Denied:   PASRR Number:    Discharge Plan: SNF    Current Diagnoses: Patient Active Problem List   Diagnosis Date Noted  . Alcohol withdrawal seizure ([place]) 05/14/2019  . Malnutrition of moderate degree 09/13/2017  . Hypertension 09/10/2017  . AKI (acute kidney injury) ([place]) 09/10/2017  . Chest pain 09/10/2017  . Hyponatremia 09/10/2017  . Hypotension 09/10/2017  . Near syncope 09/10/2017  . Neck pain 09/10/2017    Orientation RESPIRATION BLADDER Height & Weight     Self, Time, Situation, Place  Normal Continent Weight: 52.1 kg Height:  5\' 10"  (177.8 cm)  BEHAVIORAL SYMPTOMS/MOOD NEUROLOGICAL BOWEL NUTRITION STATUS    Convulsions/Seizures Continent Diet(Regular)  AMBULATORY STATUS COMMUNICATION OF NEEDS Skin   Limited Assist Verbally Normal                       Personal Care Assistance Level of Assistance  Bathing, Feeding, Dressing Bathing Assistance: Limited assistance Feeding assistance: Independent Dressing Assistance: Limited assistance     Functional Limitations Info  Sight, Hearing, Speech Sight Info: Adequate Hearing Info: Adequate Speech Info: Adequate    SPECIAL CARE FACTORS FREQUENCY  PT (By licensed PT), OT (By licensed OT)     PT Frequency: 5x/wk OT Frequency: 5x/wk            Contractures Contractures Info: Not present    Additional Factors  Info  Code Status, Allergies, Psychotropic Code Status Info: Full Allergies Info: NKA Psychotropic Info: Librium 25 mg daily on a taper to off         Current Medications (05/18/2019):  This is the current hospital active medication list Current Facility-Administered Medications  Medication Dose Route Frequency Provider Last Rate Last Dose  . acetaminophen (TYLENOL) tablet 650 mg  650 mg Oral Q6H PRN Welford Roche, MD   650 mg at 05/18/19 1535  . chlordiazePOXIDE (LIBRIUM) capsule 25 mg  25 mg Oral Daily Masoudi, Elhamalsadat, MD   25 mg at 05/18/19 1030  . enoxaparin (LOVENOX) injection 40 mg  40 mg Subcutaneous Q24H Masoudi, Elhamalsadat, MD   40 mg at 05/18/19 1030  . feeding supplement (ENSURE ENLIVE) (ENSURE ENLIVE) liquid 237 mL  237 mL Oral TID BM Jose Persia, MD   237 mL at 05/18/19 1517  . folic acid (FOLVITE) tablet 1 mg  1 mg Oral Daily Aldine Contes, MD   1 mg at 05/18/19 1030  . sodium chloride flush (NS) 0.9 % injection 3 mL  3 mL Intravenous Q12H Masoudi, Elhamalsadat, MD   3 mL at 05/18/19 1031  . thiamine (VITAMIN B-1) tablet 100 mg  100 mg Oral Daily Carlisle Cater, PA-C   100 mg at 05/18/19 1030     Discharge Medications: Please see discharge summary for a list of discharge medications.  Relevant Imaging Results:  Relevant Lab Results:   Additional Information SS#: [id number]  Pollie Friar,  RN

## 2019-05-18 NOTE — TOC Progression Note (Addendum)
Transition of Care [facility]) - Progression Note    Patient Details  Name: Pasqualino Witherspoon MRN: [id number] Date of Birth: [birth year]  Transition [facility] Care [facility]) CM/SW Contact  Pollie Friar, RN Phone Number: 05/18/2019, 10:57 AM  Clinical Narrative:    CM met with the patient today to discuss SNF rehab. At this time he is refusing SNF. He doesn't want to have to stay the 30 days and give up his check for the month. TOC will continue to follow.  1600 Addendum: per MD pt considering SNF rehab. Pt asking to speak to his friend again about what to do at d/c. She has not been to the hospital today like she told him. CM attempted to find her at one of the Motels they frequent without success. Per patient she has his debit card so he has no access to money for a motel until he finds her. He will update everyone on his decision tomorrow of SNF vs motel. Was agreeable to being faxed out until decision made.   Expected Discharge Plan: Home/Self Care Barriers to Discharge: Continued Medical Work up  Expected Discharge Plan and Services Expected Discharge Plan: Home/Self Care   Discharge Planning Services: CM Consult   Living arrangements for the past 2 months: Homeless                                       Social Determinants of Health (SDOH) Interventions    Readmission Risk Interventions No flowsheet data found.

## 2019-05-18 NOTE — Progress Notes (Signed)
  Subjective:   Larry Byrd reports he is feeling better today. He has no acute complaints at this time. He is concerned about going to a SNF and leaving Larry Byrd out by herself but will talk to her about it and see what she thinks.   Objective:  Vital signs in last 24 hours: Vitals:   05/17/19 1614 05/17/19 2021 05/17/19 2349 05/18/19 0421  BP: 135/83 139/80 131/74 129/70  Pulse: 83 81 80 69  Resp: 13 19 17 15   Temp: 98.5 F (36.9 C) 98.2 F (36.8 C) 98.4 F (36.9 C) 98 F (36.7 C)  TempSrc: Oral Axillary Oral Axillary  SpO2: 100% 99% 100% 100%  Weight:      Height:       Physical Exam Vitals signs and nursing note reviewed.  Constitutional:      Appearance: He is cachectic.  Cardiovascular:     Rate and Rhythm: Normal rate and regular rhythm.     Heart sounds: No murmur.  Pulmonary:     Effort: Pulmonary effort is normal. No respiratory distress.  Skin:    General: Skin is warm and dry.  Neurological:     Mental Status: He is alert and oriented to person, place, and time.  Psychiatric:        Mood and Affect: Mood normal.        Behavior: Behavior normal.    Assessment/Plan:  Active Problems:   Alcohol withdrawal seizure (HCC)  Larry Byrd is a 63 y/o male with a PMHx of alcohol abuse who is currently admitted for withdrawal seizure and alcohol detoxification.   # Seizure 2/2 Alcohol Withdrawal: # Alcohol Use disorder: CIWA scores are consistently 0 or 1 for the past 24 hours. Will taper Librium to once per day.   - Continue seizure precautions - Continue CIWA Ativan PRN per protocol - Continue scheduled Librium, once per day - Continue Thiamine and Folic acid  - A.M. Lab Work: BMP, CBC   # Macrocytic Anemia: # Thrombocytopenia # Pancytopenia  Secondary to long standing, severe alcohol use leading to bone marrow suppresion suspected. B12 and folate are unremarkable further supporting bone marrow suppression.  Stable since admission.    - CBC  -  Continue Folic acid repletion   # Transaminitis: Stable.   - No intervention required at this time.   # Hypokalemia: Resolved  # Hyponatremia: Resolved  Diet: Regular Code: Full DVT PPX: Enoxaparin  Dispo: Anticipated discharge pending SNF placement.   Dr. Jose Persia Internal Medicine PGY-1  Pager: [phone] 05/18/2019, 6:57 AM

## 2019-05-18 NOTE — Progress Notes (Signed)
Physical Therapy Treatment Patient Details Name: Larry Byrd MRN: [id number] DOB: May 09, 1956 Today's Date: 05/18/2019    History of Present Illness 63 y.o. yo male w/ PMH significant for HTN, alcohol abuse, who presents to the ED with c/o seizure secondary to alcohol withdrawal. Also with noted hypokalemia, hyponatremia, and transaminitis.    PT Comments    Pt making slow steady progress towards physical therapy goals. Requiring less assist for transfers, ambulating limited hallway distances with walker. HR peak 120 bpm. Displays slow gait speed, decreased endurance, and generalized weakness. Will continue to follow acutely.     Follow Up Recommendations  SNF     Equipment Recommendations  Rolling walker with 5" wheels    Recommendations for Other Services       Precautions / Restrictions Precautions Precautions: Fall Restrictions Weight Bearing Restrictions: No    Mobility  Bed Mobility Overal bed mobility: Needs Assistance Bed Mobility: Supine to Sit;Sit to Supine     Supine to sit: Supervision Sit to supine: Supervision      Transfers Overall transfer level: Needs assistance Equipment used: None Transfers: Sit to/from Stand Sit to Stand: Min assist         General transfer comment: Light minA to boost to stand, slow to rise  Ambulation/Gait Ambulation/Gait assistance: Min guard Gait Distance (Feet): 150 Feet Assistive device: Rolling walker (2 wheeled) Gait Pattern/deviations: Step-through pattern;Decreased stride length;Narrow base of support;Decreased dorsiflexion - right;Decreased dorsiflexion - left Gait velocity: decreased Gait velocity interpretation: <1.31 ft/sec, indicative of household ambulator General Gait Details: Slow, steady pace. Cues for walker proximity   Stairs             Wheelchair Mobility    Modified Rankin (Stroke Patients Only)       Balance Overall balance assessment: Needs assistance Sitting-balance  support: Feet supported Sitting balance-Leahy Scale: Good     Standing balance support: No upper extremity supported;During functional activity Standing balance-Leahy Scale: Poor Standing balance comment: reliant on external support                            Cognition Arousal/Alertness: Awake/alert Behavior During Therapy: WFL for tasks assessed/performed Overall Cognitive Status: Within Functional Limits for tasks assessed                                 General Comments: WFL for basic mobility tasks      Exercises      General Comments        Pertinent Vitals/Pain Pain Assessment: Faces Faces Pain Scale: Hurts little more Pain Location: generalized Pain Descriptors / Indicators: Grimacing Pain Intervention(s): Monitored during session    Home Living                      Prior Function            PT Goals (current goals can now be found in the care plan section) Acute Rehab PT Goals Patient Stated Goal: "go to rehab." PT Goal Formulation: With patient Time For Goal Achievement: 05/30/19 Potential to Achieve Goals: Fair Progress towards PT goals: Progressing toward goals    Frequency    Min 2X/week      PT Plan Current plan remains appropriate    Co-evaluation              AM-PAC PT "6 Clicks" Mobility   Outcome Measure  Help needed turning from your back to your side while in a flat bed without using bedrails?: None Help needed moving from lying on your back to sitting on the side of a flat bed without using bedrails?: None Help needed moving to and from a bed to a chair (including a wheelchair)?: A Little Help needed standing up from a chair using your arms (e.g., wheelchair or bedside chair)?: A Lot Help needed to walk in hospital room?: A Little Help needed climbing 3-5 steps with a railing? : A Lot 6 Click Score: 18    End of Session Equipment Utilized During Treatment: Gait belt Activity Tolerance:  Patient tolerated treatment well Patient left: in bed;with call bell/phone within reach;with bed alarm set Nurse Communication: Mobility status PT Visit Diagnosis: Unsteadiness on feet (R26.81);Muscle weakness (generalized) (M62.81);Difficulty in walking, not elsewhere classified (R26.2)     Time: 1062-6948 PT Time Calculation (min) (ACUTE ONLY): 26 min  Charges:  $Gait Training: 8-22 mins $Therapeutic Activity: 8-22 mins                     Laurina Bustle, PT, DPT Acute Rehabilitation Services Pager [phone] Office [phone]    Vanetta Mulders 05/18/2019, 5:07 PM

## 2019-05-18 NOTE — Plan of Care (Signed)
  Problem: Clinical Measurements: Goal: Respiratory complications will improve Outcome: Adequate for Discharge   Problem: Clinical Measurements: Goal: Ability to maintain clinical measurements within normal limits will improve Outcome: Adequate for Discharge   Problem: Nutrition: Goal: Adequate nutrition will be maintained Outcome: Adequate for Discharge   Problem: Pain Managment: Goal: General experience of comfort will improve Outcome: Adequate for Discharge

## 2019-05-19 LAB — BASIC METABOLIC PANEL
Anion gap: 11 (ref 5–15)
BUN: 16 mg/dL (ref 8–23)
CO2: 30 mmol/L (ref 22–32)
Calcium: 10.3 mg/dL (ref 8.9–10.3)
Chloride: 98 mmol/L (ref 98–111)
Creatinine, Ser: 0.78 mg/dL (ref 0.61–1.24)
GFR calc Af Amer: >60 mL/min (ref >60)
GFR calc non Af Amer: >60 mL/min (ref >60)
Glucose, Bld: 105 mg/dL — ABNORMAL HIGH (ref 70–99)
Potassium: 3.7 mmol/L (ref 3.5–5.1)
Sodium: 139 mmol/L (ref 135–145)

## 2019-05-19 LAB — CBC WITH DIFFERENTIAL/PLATELET
Abs Immature Granulocytes: 0.03 10*3/uL (ref 0.00–0.07)
Basophils Absolute: 0.1 10*3/uL (ref 0.0–0.1)
Basophils Relative: 2 %
Eosinophils Absolute: 0.1 10*3/uL (ref 0.0–0.5)
Eosinophils Relative: 2 %
HCT: 29.2 % — ABNORMAL LOW (ref 39.0–52.0)
Hemoglobin: 9.7 g/dL — ABNORMAL LOW (ref 13.0–17.0)
Immature Granulocytes: 1 %
Lymphocytes Relative: 30 %
Lymphs Abs: 1 10*3/uL (ref 0.7–4.0)
MCH: 36.3 pg — ABNORMAL HIGH (ref 26.0–34.0)
MCHC: 33.2 g/dL (ref 30.0–36.0)
MCV: 109.4 fL — ABNORMAL HIGH (ref 80.0–100.0)
Monocytes Absolute: 0.8 10*3/uL (ref 0.1–1.0)
Monocytes Relative: 22 %
Neutro Abs: 1.5 10*3/uL — ABNORMAL LOW (ref 1.7–7.7)
Neutrophils Relative %: 43 %
Platelets: 229 10*3/uL (ref 150–400)
RBC: 2.67 MIL/uL — ABNORMAL LOW (ref 4.22–5.81)
RDW: 12.9 % (ref 11.5–15.5)
WBC: 3.5 10*3/uL — ABNORMAL LOW (ref 4.0–10.5)
nRBC: 0 % (ref 0.0–0.2)

## 2019-05-19 NOTE — TOC Progression Note (Addendum)
Transition of Care [facility]) - Progression Note    Patient Details  Name: Larry Byrd MRN: [id number] Date of Birth: 1956-01-27  Transition of Care [facility]) CM/SW [place], [place] Phone Number: 05/19/2019, 10:47 AM  Clinical Narrative:     CSW received a message from MD. The patient is now agreeable to SNF. The patient does not currently have any bed offers. CSW will need an updated COVID screen before discharge.   CSW reached out to the following:   [place] to hear back from:   [place]   CSW spoke with Gerald Stabs at [facility] and they have beds at [place]. CSW sent the referral through the hub.   CSW will continue to follow and assist with disposition planning.   Expected Discharge Plan: Home/Self Care Barriers to Discharge: Continued Medical Work up  Expected Discharge Plan and Services Expected Discharge Plan: Home/Self Care   Discharge Planning Services: CM Consult   Living arrangements for the past 2 months: Homeless                                       Social Determinants of Health (SDOH) Interventions    Readmission Risk Interventions No flowsheet data found.

## 2019-05-19 NOTE — Progress Notes (Signed)
  Subjective:   [name] reports he is feeling better today. He has no acute complaints at this time. He is concerned about going to a SNF and leaving Larry Byrd out by herself but will talk to her about it and see what she thinks.   Objective:  Vital signs in last 24 hours: Vitals:   05/18/19 2000 05/18/19 2330 05/19/19 0346 05/19/19 0731  BP: 123/77 126/69 116/61   Pulse: 80 74 74   Resp:  18 18   Temp:  98.4 F (36.9 C) 98.9 F (37.2 C) 98.4 F (36.9 C)  TempSrc:  Oral Oral Oral  SpO2:  100% 98%   Weight:      Height:       Physical Exam Vitals signs and nursing note reviewed.  Constitutional:      Appearance: He is cachectic.  Cardiovascular:     Rate and Rhythm: Normal rate and regular rhythm.     Heart sounds: No murmur.  Pulmonary:     Effort: Pulmonary effort is normal. No respiratory distress.  Skin:    General: Skin is warm and dry.  Neurological:     Mental Status: He is alert and oriented to person, place, and time.  Psychiatric:        Mood and Affect: Mood normal.        Behavior: Behavior normal.    Assessment/Plan:  Active Problems:   Alcohol withdrawal seizure (HCC)  Larry Byrd is a 63 y/o male with a PMHx of alcohol abuse who is currently admitted for withdrawal seizure and alcohol detoxification.   # Seizure 2/2 Alcohol Withdrawal: # Alcohol Use disorder: CIWA scores are consistently 0 for the past 48 hours. Discontinued Librium after he received dose today.   - Continue seizure precautions - Continue CIWA Ativan PRN per protocol - Continue Thiamine and Folic acid  - A.M. Lab Work: BMP, CBC   # Macrocytic Anemia: # Thrombocytopenia # Pancytopenia  Secondary to long standing, severe alcohol use leading to bone marrow suppresion suspected. B12 and folate are unremarkable further supporting bone marrow suppression.  Stable since admission.    - CBC  - Continue Folic acid repletion   # Transaminitis: Stable.   - No intervention  required at this time.   # Hypokalemia: Resolved  # Hyponatremia: Resolved  Diet: Regular Code: Full DVT PPX: Enoxaparin  Dispo: Anticipated discharge pending SNF placement.   Dr. Jose Persia Internal Medicine PGY-1  Pager: [phone] 05/19/2019, 10:31 AM

## 2019-05-20 DIAGNOSIS — R748 Abnormal levels of other serum enzymes: Secondary | ICD-10-CM

## 2019-05-20 DIAGNOSIS — D61818 Other pancytopenia: Secondary | ICD-10-CM

## 2019-05-20 LAB — CBC WITH DIFFERENTIAL/PLATELET
Abs Immature Granulocytes: 0.04 10*3/uL (ref 0.00–0.07)
Basophils Absolute: 0.1 10*3/uL (ref 0.0–0.1)
Basophils Relative: 1 %
Eosinophils Absolute: 0.1 10*3/uL (ref 0.0–0.5)
Eosinophils Relative: 3 %
HCT: 28.3 % — ABNORMAL LOW (ref 39.0–52.0)
Hemoglobin: 9.2 g/dL — ABNORMAL LOW (ref 13.0–17.0)
Immature Granulocytes: 1 %
Lymphocytes Relative: 34 %
Lymphs Abs: 1.3 10*3/uL (ref 0.7–4.0)
MCH: 35.9 pg — ABNORMAL HIGH (ref 26.0–34.0)
MCHC: 32.5 g/dL (ref 30.0–36.0)
MCV: 110.5 fL — ABNORMAL HIGH (ref 80.0–100.0)
Monocytes Absolute: 0.8 10*3/uL (ref 0.1–1.0)
Monocytes Relative: 21 %
Neutro Abs: 1.6 10*3/uL — ABNORMAL LOW (ref 1.7–7.7)
Neutrophils Relative %: 40 %
Platelets: 278 10*3/uL (ref 150–400)
RBC: 2.56 MIL/uL — ABNORMAL LOW (ref 4.22–5.81)
RDW: 13 % (ref 11.5–15.5)
WBC: 3.9 10*3/uL — ABNORMAL LOW (ref 4.0–10.5)
nRBC: 0.5 % — ABNORMAL HIGH (ref 0.0–0.2)

## 2019-05-20 LAB — BASIC METABOLIC PANEL
Anion gap: 10 (ref 5–15)
BUN: 17 mg/dL (ref 8–23)
CO2: 28 mmol/L (ref 22–32)
Calcium: 9.7 mg/dL (ref 8.9–10.3)
Chloride: 102 mmol/L (ref 98–111)
Creatinine, Ser: 0.99 mg/dL (ref 0.61–1.24)
GFR calc Af Amer: >60 mL/min (ref >60)
GFR calc non Af Amer: >60 mL/min (ref >60)
Glucose, Bld: 100 mg/dL — ABNORMAL HIGH (ref 70–99)
Potassium: 3.7 mmol/L (ref 3.5–5.1)
Sodium: 140 mmol/L (ref 135–145)

## 2019-05-20 NOTE — Progress Notes (Addendum)
   Subjective: Patient was seen and evaluated at bedside on morning rounds. He is trying to order food over the phone and is frustrated because he think they hung up on him. He is also concern about his lady friend that did not come to the hospital. No other acute complaints. Denies any pain or other new symptoms.   Objective:  Vital signs in last 24 hours: Vitals:   05/19/19 1534 05/19/19 1942 05/19/19 2332 05/20/19 0908  BP: (!) 98/58 128/80 113/64 121/72  Pulse: 83 85 82 82  Resp: 16 18 18 20   Temp: 98.6 F (37 C) 98.9 F (37.2 C) 98.6 F (37 C) 98.8 F (37.1 C)  TempSrc: Oral Oral Oral Oral  SpO2: 100% 100% 100% 98%  Weight:      Height:       Physical Exam Constitutional:      Appearance: He is not ill-appearing.  Cardiovascular:     Rate and Rhythm: Normal rate and regular rhythm.     Pulses: Normal pulses.     Heart sounds: Normal heart sounds. No murmur.  Pulmonary:     Effort: Pulmonary effort is normal.     Breath sounds: Normal breath sounds. No wheezing or rales.  Abdominal:     General: There is no distension.     Tenderness: There is no abdominal tenderness.  Neurological:     Mental Status: He is alert and oriented to person, place, and time.     Assessment/Plan:  Active Problems:   Alcohol withdrawal seizure (HCC)  Larry Byrd is a 63 y/o male with a PMHx of alcohol abuse who is currently admitted for withdrawal seizure and alcohol detoxification.   # Seizure 2/2 Alcohol Withdrawal: # Alcohol Use disorder: CIWA scores are consistently 0-3  Discontinued Librium yesterday  - Continue seizure precautions - Continue CIWA  - Continue Thiamine and Folic acid  - daily BMP, CBC   # Macrocytic Anemia: # Thrombocytopenia # Pancytopenia  Secondary to long standing, severe alcohol use leading to bone marrow suppresion suspected. B12 and folate are unremarkable further supporting bone marrow suppression.  Stable since admission.    -  CBC  daily - Continue Folic acid repletion   # elevated liver enzymes: Stable.  - No intervention required at this time.    Diet: Regular Code: Full DVT PPX: Enoxaparin  Dispo: Anticipated discharge pending placement  Dewayne Hatch, MD 05/20/2019, 4:40 PM Pager: [phone]

## 2019-05-21 DIAGNOSIS — R509 Fever, unspecified: Secondary | ICD-10-CM

## 2019-05-21 DIAGNOSIS — K7010 Alcoholic hepatitis without ascites: Secondary | ICD-10-CM

## 2019-05-21 NOTE — Progress Notes (Signed)
Patient choose to be discharged to sidewalk with girlfriend V/S snf

## 2019-05-21 NOTE — Progress Notes (Signed)
  Subjective:   No acute complaints this morning. He was not aware of having a fever but wonders if drinking hot tea may have contributed.   Larry Byrd does note that he did not give his lady friend his debit card, but instead left it in the hospital gown pocket and is worried that it is lost. He is still willing to go to SNF.  Objective:  Vital signs in last 24 hours: Vitals:   05/20/19 1705 05/20/19 2023 05/20/19 2351 05/21/19 0319  BP: 125/72 121/61 113/63 (!) 96/47  Pulse: 85 99 93 84  Resp: (!) 21 18 18 18   Temp: 98.4 F (36.9 C) 99.3 F (37.4 C) (!) 101.1 F (38.4 C) 99.3 F (37.4 C)  TempSrc: Oral Oral Oral Oral  SpO2: 99% 100% 100% 100%  Weight:      Height:       Physical Exam Vitals signs and nursing note reviewed.  Constitutional:      Appearance: He is cachectic.  Cardiovascular:     Rate and Rhythm: Normal rate and regular rhythm.     Heart sounds: No murmur.  Pulmonary:     Effort: Pulmonary effort is normal. No respiratory distress.  Skin:    General: Skin is warm and dry.     Comments: Left forearm with 3 cm area that is red and erythematous with increased warmth, site of IV infiltration  Neurological:     Mental Status: He is alert and oriented to person, place, and time.  Psychiatric:        Mood and Affect: Mood normal.        Behavior: Behavior normal.    Assessment/Plan:  Active Problems:   Alcohol withdrawal seizure (HCC)  Larry Byrd is a 63 y/o male with a PMHx of alcohol abuse who is currently admitted for withdrawal seizure and alcohol detoxification.   # Seizure 2/2 Alcohol Withdrawal: # Alcohol Use disorder: Discontinued Librium over the weekend. CIWA scores are consistently 0 for the past 48 hours.   - Continue seizure precautions - Continue CIWA without ativan  - Continue Thiamine and Folic acid  - A.M. Lab Work: BMP, CBC   # Macrocytic Anemia: # Thrombocytopenia # Pancytopenia  Secondary to long standing, severe  alcohol use leading to bone marrow suppresion suspected. B12 and folate are unremarkable further supporting bone marrow suppression.  Stable since admission.    - CBC  - Continue Folic acid repletion   # Fever One febrile event overnight of 101.1. Repeat vital signs approximately 4 hours later showed fever had resolved but BP was slightly soft. No obvious source of infection. WBC remains low. He did have an IV infiltration that is quite red and irritated today, which may be contributing vs atelectasis from decreased movement. Will continue to monitor closely. If any additional fevers, consider chest xray, urinalysis, blood cultures.   - Monitor closely.   # Hypokalemia: Resolved  # Hyponatremia: Resolved  Diet: Regular Code: Full DVT PPX: Enoxaparin  Dispo: Anticipated discharge pending SNF placement.   Dr. Jose Persia Internal Medicine PGY-1  Pager: [phone] 05/21/2019, 7:17 AM

## 2019-05-21 NOTE — Discharge Summary (Signed)
Name: Larry Byrd MRN: [id number] DOB: [birth year] 63 y.o. PCP: Patient, No Pcp Per  Date of Admission: 05/14/2019 10:29 AM Date of Discharge:  Attending Physician: Aldine Contes, MD  Discharge Diagnosis: 1. Seizure 2/2 Alcohol Withdrawal  2. Electrolyte Abnormalities  3. Pancytopenia  4. Alcohol Hepatitis  Discharge Medications: Allergies as of 05/21/2019   No Known Allergies     Medication List    TAKE these medications   amLODipine 5 MG tablet Commonly known as: NORVASC Take 1 tablet (5 mg total) by mouth daily.   lisinopril-hydrochlorothiazide 20-12.5 MG tablet Commonly known as: ZESTORETIC Take 1 tablet by mouth daily.   meloxicam 7.5 MG tablet Commonly known as: Mobic Take 1 tablet (7.5 mg total) by mouth daily.   multivitamin with minerals Tabs tablet Take 1 tablet by mouth daily.   potassium chloride SA 20 MEQ tablet Commonly known as: KLOR-CON Take 2 tablets (40 mEq total) by mouth daily.            Durable Medical Equipment  (From admission, onward)         Start     Ordered   05/17/19 1041  For home use only DME Walker rolling  Once    Question:  Patient needs a walker to treat with the following condition  Answer:  Alcohol withdrawal syndrome ([place])   05/17/19 1041          Disposition and follow-up:   Larry Byrd was discharged from [facility] in Good condition.  At the hospital follow up visit please address:  1. Seizure 2/2 Alcohol Withdrawal: Discuss quitting drinking and resources available to help with this.  2. Electrolyte Abnormalities: Check CMP to ensure continued normalization of sodium and potassium.  3. Pancytopenia: Check CBC to ensure no acute changes to hemoglobin or platelets.  4. Alcohol Hepatitis: No evidence of cirrhosis during this admission but may benefit from abdominal imaging to evaluate for this.   5.  Labs / imaging needed at time of follow-up: CBC, CMP  6.  Pending labs/  test needing follow-up: None   Follow-up Appointments: Follow-up Information    [facility] Follow up on 06/05/2019.   Specialty: Family Medicine Why: Your appointment time is 2:30 pm. Please arrive early and bring: picture ID, medicaid card, current medications. Contact information: [place] [address] [place] [facility] Course by problem list: 1. Seizure 2/2 Alcohol Withdrawal  Patient presented to the ED after witnessed seizure. Upon arrival to the ED, he did require Ativan initially. CT Head was negative for acute abnormalities. CIWA scores trended down quickly over 3 days and he did not require Ativan any further. Placed on Librium, initially TID, but weaned gradually without complications. Recommended he abstain from further alcohol use to avoid seizures in the future. Of note, he endorsed multiple 40 oz beers daily, on most days.   2. Electrolyte Abnormalities  Likely 2/2 to food resource limitations and EtOH use disorder. He had marked improvement in his sodium and potassium within 1-2 days and did not require further repletion. He was given thiamine and folic acid daily.   3. Pancytopenia  Secondary to long standing, severe alcohol use leading to bone marrow suppresion suspected. MCV is elevated but B12 and folate are unremarkable. He did not require any intervention.   4. Alcohol Hepatitis Secondary to long standing, severe alcohol use. AST is approximately 3x his ALT (143,  46) on admission but has been trending downwards now. Per chart review, this is not the highest it has been. No evidence of cirrhosis during this visit but no abdominal imaging done either. Recommend outpatient follow up.    Discharge Vitals:   BP 120/72 (BP Location: Right Arm)   Pulse 80   Temp 98.6 F (37 C) (Oral)   Resp 16   Ht 5\' 10"  (1.778 m)   Wt 52.1 kg   SpO2 98%   BMI 16.49 kg/m   Pertinent Labs, Studies, and  Procedures:  CBC Latest Ref Rng & Units 05/20/2019 05/19/2019 05/18/2019  WBC 4.0 - 10.5 K/uL 3.9(L) 3.5(L) 3.0(L)  Hemoglobin 13.0 - 17.0 g/dL 05/20/2019) 9.9(B) 7.1(I)  Hematocrit 39.0 - 52.0 % 28.3(L) 29.2(L) 29.7(L)  Platelets 150 - 400 K/uL 278 229 180   CMP     Component Value Date/Time   NA 140 05/20/2019 0345   K 3.7 05/20/2019 0345   CL 102 05/20/2019 0345   CO2 28 05/20/2019 0345   GLUCOSE 100 (H) 05/20/2019 0345   BUN 17 05/20/2019 0345   CREATININE 0.99 05/20/2019 0345   CALCIUM 9.7 05/20/2019 0345   PROT 5.5 (L) 05/16/2019 0409   ALBUMIN 2.6 (L) 05/16/2019 0409   AST 71 (H) 05/16/2019 0409   ALT 33 05/16/2019 0409   ALKPHOS 96 05/16/2019 0409   BILITOT 0.9 05/16/2019 0409   GFRNONAA >60 05/20/2019 0345   GFRAA >60 05/20/2019 0345   CT Head w/o Contrast: 05/14/2019 Atrophy, chronic microvascular disease.  No acute intracranial abnormality.  Discharge Instructions: Discharge Instructions    Call MD for:  difficulty breathing, headache or visual disturbances   Complete by: As directed    Call MD for:  extreme fatigue   Complete by: As directed    Call MD for:  persistant dizziness or light-headedness   Complete by: As directed    Diet - low sodium heart healthy   Complete by: As directed    Discharge instructions   Complete by: As directed    Thank you for allowing 05/16/2019 to take part in your medical care.   You were admitted to the hospital for a seizure due to alcohol withdrawal. To avoid this happening again in the future, please refrain from additional alcohol use.   Increase activity slowly   Complete by: As directed       Signed: Dr. [place] Internal Medicine PGY-1  Pager: [phone] 05/21/2019, 3:17 PM

## 2019-05-21 NOTE — Progress Notes (Signed)
Patient education and care plans have been reconciled

## 2019-05-21 NOTE — Plan of Care (Signed)
Adequate for discharge.

## 2019-05-21 NOTE — TOC Transition Note (Signed)
Transition of Care [facility]) - CM/SW Discharge Note   Patient Details  Name: Hawke Villalpando MRN: [id number] Date of Birth: [birth year]  Transition of Care [facility]) CM/SW Contact:  Pollie Friar, RN Phone Number: 05/21/2019, 3:42 PM   Clinical Narrative:    Recommendations are for SNF. Pt has chosen to d/c with his friend to the street and not go to rehab.  Pt has walker at the bedside.  CM inquired about d/c medications. MD did not order any medications at d/c.     Final next level of care: Other (comment)(homeless) Barriers to Discharge: No Barriers Identified   Patient Goals and CMS Choice        Discharge Placement                       Discharge Plan and Services   Discharge Planning Services: CM Consult                                 Social Determinants of Health (SDOH) Interventions     Readmission Risk Interventions No flowsheet data found.

## 2019-05-21 NOTE — Plan of Care (Signed)
Adequate for discharge, patient chooses to leave V/S snf

## 2019-06-05 ENCOUNTER — Inpatient Hospital Stay (INDEPENDENT_AMBULATORY_CARE_PROVIDER_SITE_OTHER): Payer: Medicaid Other | Admitting: Primary Care

## 2019-07-10 ENCOUNTER — Emergency Department (HOSPITAL_COMMUNITY): Payer: Medicare Other

## 2019-07-10 ENCOUNTER — Encounter (HOSPITAL_COMMUNITY): Payer: Self-pay | Admitting: Internal Medicine

## 2019-07-10 ENCOUNTER — Other Ambulatory Visit: Payer: Self-pay

## 2019-07-10 ENCOUNTER — Inpatient Hospital Stay (HOSPITAL_COMMUNITY)
Admission: EM | Admit: 2019-07-10 | Discharge: 2019-07-12 | DRG: 897 | Disposition: A | Discharge status: Home / Self Care | Payer: Medicare Other | Attending: Internal Medicine | Admitting: Internal Medicine

## 2019-07-10 DIAGNOSIS — D696 Thrombocytopenia, unspecified: Secondary | ICD-10-CM | POA: Diagnosis present

## 2019-07-10 DIAGNOSIS — R1312 Dysphagia, oropharyngeal phase: Secondary | ICD-10-CM

## 2019-07-10 DIAGNOSIS — F10239 Alcohol dependence with withdrawal, unspecified: Secondary | ICD-10-CM

## 2019-07-10 DIAGNOSIS — F17210 Nicotine dependence, cigarettes, uncomplicated: Secondary | ICD-10-CM | POA: Diagnosis present

## 2019-07-10 DIAGNOSIS — E874 Mixed disorder of acid-base balance: Secondary | ICD-10-CM | POA: Diagnosis present

## 2019-07-10 DIAGNOSIS — G4089 Other seizures: Secondary | ICD-10-CM | POA: Diagnosis present

## 2019-07-10 DIAGNOSIS — E873 Alkalosis: Secondary | ICD-10-CM

## 2019-07-10 DIAGNOSIS — E876 Hypokalemia: Secondary | ICD-10-CM | POA: Diagnosis present

## 2019-07-10 DIAGNOSIS — N179 Acute kidney failure, unspecified: Secondary | ICD-10-CM | POA: Diagnosis present

## 2019-07-10 DIAGNOSIS — G40509 Epileptic seizures related to external causes, not intractable, without status epilepticus: Secondary | ICD-10-CM | POA: Diagnosis not present

## 2019-07-10 DIAGNOSIS — Z720 Tobacco use: Secondary | ICD-10-CM | POA: Diagnosis not present

## 2019-07-10 DIAGNOSIS — F10939 Alcohol use, unspecified with withdrawal, unspecified: Secondary | ICD-10-CM

## 2019-07-10 DIAGNOSIS — Z79899 Other long term (current) drug therapy: Secondary | ICD-10-CM | POA: Diagnosis not present

## 2019-07-10 DIAGNOSIS — E872 Acidosis: Secondary | ICD-10-CM | POA: Diagnosis present

## 2019-07-10 DIAGNOSIS — R569 Unspecified convulsions: Secondary | ICD-10-CM | POA: Diagnosis not present

## 2019-07-10 DIAGNOSIS — Z791 Long term (current) use of non-steroidal anti-inflammatories (NSAID): Secondary | ICD-10-CM | POA: Diagnosis not present

## 2019-07-10 DIAGNOSIS — K5790 Diverticulosis of intestine, part unspecified, without perforation or abscess without bleeding: Secondary | ICD-10-CM | POA: Diagnosis present

## 2019-07-10 DIAGNOSIS — I10 Essential (primary) hypertension: Secondary | ICD-10-CM | POA: Diagnosis present

## 2019-07-10 DIAGNOSIS — Z20828 Contact with and (suspected) exposure to other viral communicable diseases: Secondary | ICD-10-CM | POA: Diagnosis present

## 2019-07-10 DIAGNOSIS — Z590 Homelessness: Secondary | ICD-10-CM | POA: Diagnosis not present

## 2019-07-10 DIAGNOSIS — N400 Enlarged prostate without lower urinary tract symptoms: Secondary | ICD-10-CM | POA: Diagnosis present

## 2019-07-10 DIAGNOSIS — F10230 Alcohol dependence with withdrawal, uncomplicated: Secondary | ICD-10-CM

## 2019-07-10 DIAGNOSIS — F10988 Alcohol use, unspecified with other alcohol-induced disorder: Secondary | ICD-10-CM | POA: Diagnosis not present

## 2019-07-10 DIAGNOSIS — E8729 Other acidosis: Secondary | ICD-10-CM

## 2019-07-10 LAB — COMPREHENSIVE METABOLIC PANEL
ALT: 23 U/L (ref 0–44)
AST: 50 U/L — ABNORMAL HIGH (ref 15–41)
Albumin: 4.3 g/dL (ref 3.5–5.0)
Alkaline Phosphatase: 99 U/L (ref 38–126)
Anion gap: 22 — ABNORMAL HIGH (ref 5–15)
BUN: <5 mg/dL — ABNORMAL LOW (ref 8–23)
CO2: 19 mmol/L — ABNORMAL LOW (ref 22–32)
Calcium: 9.9 mg/dL (ref 8.9–10.3)
Chloride: 91 mmol/L — ABNORMAL LOW (ref 98–111)
Creatinine, Ser: 1.3 mg/dL — ABNORMAL HIGH (ref 0.61–1.24)
GFR calc Af Amer: >60 mL/min (ref >60)
GFR calc non Af Amer: 58 mL/min — ABNORMAL LOW (ref >60)
Glucose, Bld: 149 mg/dL — ABNORMAL HIGH (ref 70–99)
Potassium: 3.4 mmol/L — ABNORMAL LOW (ref 3.5–5.1)
Sodium: 132 mmol/L — ABNORMAL LOW (ref 135–145)
Total Bilirubin: 0.9 mg/dL (ref 0.3–1.2)
Total Protein: 8.2 g/dL — ABNORMAL HIGH (ref 6.5–8.1)

## 2019-07-10 LAB — RESPIRATORY PANEL BY RT PCR (FLU A&B, COVID)
Influenza A by PCR: NEGATIVE
Influenza B by PCR: NEGATIVE
SARS Coronavirus 2 by RT PCR: NEGATIVE

## 2019-07-10 LAB — BASIC METABOLIC PANEL WITH GFR
Anion gap: 13 (ref 5–15)
BUN: <5 mg/dL — ABNORMAL LOW (ref 8–23)
CO2: 24 mmol/L (ref 22–32)
Calcium: 8.7 mg/dL — ABNORMAL LOW (ref 8.9–10.3)
Chloride: 96 mmol/L — ABNORMAL LOW (ref 98–111)
Creatinine, Ser: 0.9 mg/dL (ref 0.61–1.24)
GFR calc Af Amer: >60 mL/min
GFR calc non Af Amer: >60 mL/min
Glucose, Bld: 87 mg/dL (ref 70–99)
Potassium: 3.8 mmol/L (ref 3.5–5.1)
Sodium: 133 mmol/L — ABNORMAL LOW (ref 135–145)

## 2019-07-10 LAB — CBC
HCT: 45.9 % (ref 39.0–52.0)
Hemoglobin: 15.5 g/dL (ref 13.0–17.0)
MCH: 33.7 pg (ref 26.0–34.0)
MCHC: 33.8 g/dL (ref 30.0–36.0)
MCV: 99.8 fL (ref 80.0–100.0)
Platelets: 142 10*3/uL — ABNORMAL LOW (ref 150–400)
RBC: 4.6 MIL/uL (ref 4.22–5.81)
RDW: 13.2 % (ref 11.5–15.5)
WBC: 6.8 10*3/uL (ref 4.0–10.5)
nRBC: 0 % (ref 0.0–0.2)

## 2019-07-10 LAB — SALICYLATE LEVEL: Salicylate Lvl: <7 mg/dL (ref 2.8–30.0)

## 2019-07-10 LAB — RAPID URINE DRUG SCREEN, HOSP PERFORMED
Amphetamines: POSITIVE — AB
Barbiturates: NOT DETECTED
Benzodiazepines: NOT DETECTED
Cocaine: NOT DETECTED
Opiates: NOT DETECTED
Tetrahydrocannabinol: NOT DETECTED

## 2019-07-10 LAB — LIPASE, BLOOD: Lipase: 25 U/L (ref 11–51)

## 2019-07-10 LAB — ETHANOL: Alcohol, Ethyl (B): <10 mg/dL (ref <10)

## 2019-07-10 LAB — PHOSPHORUS: Phosphorus: 5.1 mg/dL — ABNORMAL HIGH (ref 2.5–4.6)

## 2019-07-10 LAB — ACETAMINOPHEN LEVEL: Acetaminophen (Tylenol), Serum: <10 ug/mL — ABNORMAL LOW (ref 10–30)

## 2019-07-10 LAB — LACTIC ACID, PLASMA: Lactic Acid, Venous: 1.7 mmol/L (ref 0.5–1.9)

## 2019-07-10 LAB — MAGNESIUM: Magnesium: 2 mg/dL (ref 1.7–2.4)

## 2019-07-10 MED ORDER — ACETAMINOPHEN 650 MG RE SUPP: 650.0000 mg | Freq: Four times a day (QID) | RECTAL | Status: DC | PRN

## 2019-07-10 MED ORDER — LORAZEPAM 2 MG/ML IJ SOLN
2.0000 mg | Freq: Once | INTRAMUSCULAR | Status: AC
  Administered 2019-07-10: 2 mg via INTRAVENOUS
  Filled 2019-07-10: qty 1

## 2019-07-10 MED ORDER — LORAZEPAM 1 MG PO TABS
1.0000 mg | ORAL_TABLET | ORAL | Status: DC | PRN
  Administered 2019-07-10 (×4): 1 mg via ORAL
  Administered 2019-07-11: 17:00:00 2 mg via ORAL
  Filled 2019-07-10: qty 1
  Filled 2019-07-10: qty 2
  Filled 2019-07-10 (×3): qty 1

## 2019-07-10 MED ORDER — NICOTINE 21 MG/24HR TD PT24
21.0000 mg | MEDICATED_PATCH | Freq: Every day | TRANSDERMAL | Status: DC
  Administered 2019-07-10 – 2019-07-12 (×3): 21 mg via TRANSDERMAL
  Filled 2019-07-10 (×3): qty 1

## 2019-07-10 MED ORDER — LORAZEPAM 2 MG/ML IJ SOLN
0.0000 mg | Freq: Four times a day (QID) | INTRAMUSCULAR | Status: DC
  Administered 2019-07-10: 2 mg via INTRAVENOUS
  Filled 2019-07-10: qty 1

## 2019-07-10 MED ORDER — POTASSIUM CHLORIDE IN NACL 40-0.9 MEQ/L-% IV SOLN
INTRAVENOUS | Status: DC
  Administered 2019-07-10 – 2019-07-11 (×3): 75 mL/h via INTRAVENOUS
  Filled 2019-07-10 (×3): qty 1000

## 2019-07-10 MED ORDER — THIAMINE HCL 100 MG/ML IJ SOLN: 100.0000 mg | Freq: Every day | INTRAMUSCULAR | Status: DC

## 2019-07-10 MED ORDER — ACETAMINOPHEN 325 MG PO TABS
650.0000 mg | ORAL_TABLET | Freq: Four times a day (QID) | ORAL | Status: DC | PRN
  Administered 2019-07-10: 650 mg via ORAL
  Filled 2019-07-10: qty 2

## 2019-07-10 MED ORDER — AMLODIPINE BESYLATE 5 MG PO TABS
5.0000 mg | ORAL_TABLET | Freq: Every day | ORAL | Status: DC
  Administered 2019-07-10 – 2019-07-12 (×3): 5 mg via ORAL
  Filled 2019-07-10 (×3): qty 1

## 2019-07-10 MED ORDER — LORAZEPAM 2 MG/ML IJ SOLN: 0.0000 mg | Freq: Two times a day (BID) | INTRAMUSCULAR | Status: DC

## 2019-07-10 MED ORDER — ENOXAPARIN SODIUM 40 MG/0.4ML ~~LOC~~ SOLN
40.0000 mg | SUBCUTANEOUS | Status: DC
  Administered 2019-07-10 – 2019-07-11 (×2): 40 mg via SUBCUTANEOUS
  Filled 2019-07-10 (×2): qty 0.4

## 2019-07-10 MED ORDER — THIAMINE HCL 100 MG PO TABS
100.0000 mg | ORAL_TABLET | Freq: Every day | ORAL | Status: DC
  Administered 2019-07-10 – 2019-07-12 (×3): 100 mg via ORAL
  Filled 2019-07-10 (×3): qty 1

## 2019-07-10 MED ORDER — LORAZEPAM 2 MG/ML IJ SOLN
1.0000 mg | INTRAMUSCULAR | Status: DC | PRN
  Administered 2019-07-10 (×2): 1 mg via INTRAVENOUS
  Administered 2019-07-11 (×2): 2 mg via INTRAVENOUS
  Filled 2019-07-10 (×4): qty 1

## 2019-07-10 MED ORDER — SODIUM CHLORIDE 0.9 % IV BOLUS
1000.0000 mL | Freq: Once | INTRAVENOUS | Status: AC
  Administered 2019-07-10: 1000 mL via INTRAVENOUS

## 2019-07-10 MED ORDER — SODIUM CHLORIDE 0.9% FLUSH
3.0000 mL | Freq: Two times a day (BID) | INTRAVENOUS | Status: DC
  Administered 2019-07-10 – 2019-07-12 (×5): 3 mL via INTRAVENOUS

## 2019-07-10 MED ORDER — LORAZEPAM 1 MG PO TABS: 0.0000 mg | ORAL_TABLET | Freq: Two times a day (BID) | ORAL | Status: DC

## 2019-07-10 MED ORDER — THIAMINE HCL 100 MG PO TABS: 100.0000 mg | ORAL_TABLET | Freq: Every day | ORAL | Status: DC

## 2019-07-10 MED ORDER — ADULT MULTIVITAMIN W/MINERALS CH
1.0000 | ORAL_TABLET | Freq: Every day | ORAL | Status: DC
  Administered 2019-07-10 – 2019-07-12 (×3): 1 via ORAL
  Filled 2019-07-10 (×3): qty 1

## 2019-07-10 MED ORDER — LORAZEPAM 2 MG/ML IJ SOLN: 1.0000 mg | Freq: Once | INTRAMUSCULAR | Status: DC

## 2019-07-10 MED ORDER — ONDANSETRON HCL 4 MG/2ML IJ SOLN: 4.0000 mg | Freq: Four times a day (QID) | INTRAMUSCULAR | Status: DC | PRN

## 2019-07-10 MED ORDER — LORAZEPAM 1 MG PO TABS: 0.0000 mg | ORAL_TABLET | Freq: Four times a day (QID) | ORAL | Status: DC

## 2019-07-10 MED ORDER — FOLIC ACID 1 MG PO TABS
1.0000 mg | ORAL_TABLET | Freq: Every day | ORAL | Status: DC
  Administered 2019-07-10 – 2019-07-12 (×3): 1 mg via ORAL
  Filled 2019-07-10 (×3): qty 1

## 2019-07-10 MED ORDER — ONDANSETRON HCL 4 MG PO TABS: 4.0000 mg | ORAL_TABLET | Freq: Four times a day (QID) | ORAL | Status: DC | PRN

## 2019-07-10 NOTE — ED Notes (Signed)
Pt given turkey sandwich and sprite.  

## 2019-07-10 NOTE — Progress Notes (Signed)
Paged by nurse about patient becoming very agitated, he had just received 1 mg of Ativan that only caused worsening of his agitation.  Went to evaluate at bedside.  Patient was standing up near the bed on arrival, seemed very unsteady on his feet and almost fell a few times. Asked patient to sit on bed which he did do initially however started trying to get up and became very unsteady on his feet.  Asked patient why he was in the hospital, he was unable to give me an answer. Discussed that he is currently here for alcohol withdrawal and we are monitoring for any withdrawal symptoms.  He was unable to tell me why he was here, what we are treating him for, or what could happen if he left. He was not redirectable and continuously got up out of bed.  Patient does not seem to be able to make his own decisions at this time, I do not feel that it would be safe for him to leave AMA. Also do not feel that it would be safe for him to continuously stand up and walk around the room given his unsteadiness and risk of falling and harming himself. Will need to order soft restraints at this time for patient protection. Will also order additional dose of ativan given increased agitation.

## 2019-07-10 NOTE — ED Triage Notes (Signed)
Pt's wife states that he had a seizure for approx 2 minutes with no hx of same.

## 2019-07-10 NOTE — Progress Notes (Signed)
Patient is becoming combatvie physically with staff and refusing to follow safety interventions.  Ativan was given and patient is becoming more agitated.  Patient will not remain seated and has almost fallen several times during the time I was in the room.

## 2019-07-10 NOTE — ED Notes (Signed)
Patient transported to [facility]

## 2019-07-10 NOTE — ED Notes (Signed)
Lunch Tray Ordered @ 1126.  

## 2019-07-10 NOTE — ED Notes (Signed)
Dinner tray ordered.

## 2019-07-10 NOTE — ED Notes (Signed)
Main lab to add on magnesium, and phosphorus

## 2019-07-10 NOTE — Progress Notes (Signed)
Spoke with on-call doctor with teaching service, Noe Gens, gave verbal permission for wife to stay with patient overnight due to patient agitation and confusion.

## 2019-07-10 NOTE — H&P (Addendum)
Date: 07/10/2019               Patient Name:  Larry Byrd MRN: [id number]  DOB: 08-30-1955 Age / Sex: 63 y.o., male   PCP: Patient, No Pcp Per         Medical Service: Internal Medicine Teaching Service         Attending Physician: Dr. Inez Catalina, MD    First Contact: Dr. Ephriam Knuckles Pager: [phone]  Second Contact: Dr. Maryla Morrow Pager: [phone]       After Hours (After 5p/  First Contact Pager: [phone]  weekends / holidays): Second Contact Pager: [phone]   Chief Complaint: Seizure like activity  History of Present Illness: Larry Byrd is a 63 year old male with hypertension and alcohol use disorder who presented to the emergency department with concerns of seizure like activity. History was obtained via the patient, the patient's wife Larry Byrd), and through chart review.  The patient was in his normal state of health until early this morning when his wife states he experienced seizure like activity. They were lying in bed watching TV when he suddenly became unresponsive and stiffened up. He remained stiff for a couple seconds before this progressed to generalized shaking. The episode lasted 1 to 2 minutes and he subsequently became limp and remained unresponsive. He would open his eyes but he would not respond to questions. This prompted her to call EMS. He does drink at least two 40 oz beers per day and occasionally consumes couple shots of liquor. He does use stimulants that he obtains from a friend. He was admitted on 10/19 for alcohol withdrawal seizures. He has not been sick recently; although, his wife has been dealing with stomach virus. He otherwise denies fevers, chills, headaches, changes in vision, shortness of breath, chest pain, abdominal pain, diarrhea, constipation, myalgias, arthralgias, increased falls. He does endorse a choking sensation when he tries to eat. He states that this is limited is PO intake for the past three months.  Meds:  No current  facility-administered medications on file prior to encounter.   Current Outpatient Medications on File Prior to Encounter  Medication Sig Dispense Refill  . amLODipine (NORVASC) 5 MG tablet Take 1 tablet (5 mg total) by mouth daily. (Patient not taking: Reported on 03/29/2019) 30 tablet 1  . lisinopril-hydrochlorothiazide (PRINZIDE,ZESTORETIC) 20-12.5 MG tablet Take 1 tablet by mouth daily. (Patient not taking: Reported on 03/29/2019) 30 tablet 0  . meloxicam (MOBIC) 7.5 MG tablet Take 1 tablet (7.5 mg total) by mouth daily. (Patient not taking: Reported on 05/15/2019) 10 tablet 0  . Multiple Vitamin (MULTIVITAMIN WITH MINERALS) TABS tablet Take 1 tablet by mouth daily. (Patient not taking: Reported on 10/08/2017) 30 tablet 1  . potassium chloride SA (K-DUR,KLOR-CON) 20 MEQ tablet Take 2 tablets (40 mEq total) by mouth daily. (Patient not taking: Reported on 03/29/2019) 30 tablet 0   Allergies: Allergies as of 07/10/2019  . (No Known Allergies)   Past Medical History:  Diagnosis Date  . Alcohol dependence (HCC)   . Diverticulosis   . Enlarged prostate   . Hypertension   . Major depression, chronic    Family History: Paternal uncles with alcohol use disorder. Denies a family history of hypertension, diabetes, malignancy, or heart disease.  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 Since that time he has not worked consistently. He currently lives with his wife and two roommates. He consumes alcohol daily. Typically two 40oz ounces beers per  day in addition to couple shots of fireball. He has started smoking again and smokes one half to one pack per day. He does obtain oral stimulants from a friend.  Review of Systems: A complete ROS was negative except as per HPI.   Physical Exam: Blood pressure 131/81, pulse 94, temperature 97.8 F (36.6 C), temperature source Oral, resp. rate 20, SpO2 100 %.  General: Thin male in no acute distress HENT: Normocephalic,  atraumatic, moist mucus membranes, + left lateral tongue lacerations Pulm: Good air movement with no wheezing or crackles  CV: Tachycardic but regular rhythm, no murmurs, no rubs  Abdomen: Active bowel sounds, soft, non-distended, no tenderness to palpation  Extremities: Pulses palpable in all extremities, no LE edema  Skin: Warm and dry  Neuro: Alert and oriented x 3, tremulous, cranial nerves II-XII intact bilaterally, gross strength 5/5 in all extremities. Did not assess gait.   EKG: personally reviewed my interpretation is sinus tachycardia with normal axis and intervals. Q waves in the inferior leads. Stable compared to the EKG obtained on 05/14/2019.  CT Head without Contrast  Stable from prior. No acute or reversible finding and no cortical finding to explain seizure.  Assessment & Plan by Problem: Principal Problem:   Alcohol withdrawal seizure ([place]) Active Problems:   Hypertension   AKI (acute kidney injury) ([place])   High anion gap metabolic acidosis   Oropharyngeal dysphagia  Larry Byrd is a 63 year old male with hypertension and alcohol use disorder who presented to the emergency department with concerns of an alcohol withdrawal seizure. He was subsequently admitted for further evaluation and management.   Alcohol withdrawal seizure Alcohol use disorder - Patient presents with tonic clonic seizure like activity approximately 12 to 24 hours after his last drink of alcohol. He is currently fatigued but otherwise alert and oriented times three. - Continue seizure precautions - Check magnesium and replace as needed. - Start thiamine and folic acid - CIWA protocol with PRN IV Ativan - Patient is interested in alcohol cessation. Will discuss naltrexone prior to discharge.  AKI  Hypokalemia Anion gap metabolic acidosis with metabolic alkalosis - Elevated anion gap is likely secondary to lactic acidosis and chronic alcohol use. Will check lactic acidosis and trend. -  Underlying metabolic alkalosis may be related to underlying liver disease. - Suspect AKI is pre-renal in etiology  - Replace potassium as needed. - Bolus with IV fluid (NS) - Hold nephrotoxic medications  - Repeat BMP  Hypertension - Outpatient medications include amlodipine 5 mg once daily, lisinopril 20 mg once daily, and hydrochlorothiazide 12.5 mg once daily. - Will hold the lisinopril and hydrochlorothiazide in the setting of acute kidney injury. - Continue amlodipine 5 mg once daily.  Thrombocytopenia  - Repeat CBC in the AM  - Likely related to underlying liver disease vs toxic effects of EtOH  Oropharyngeal dysphagia - Patient describes choking sensation with initiation of swallowing that has limited his PO intake for the past three months - Will obtain speech evaluation.  Diet: NPO until swallow study  VTE ppx: Lovenox  CODE STATUS: Full Code  Dispo: Admit patient to Inpatient with expected length of stay greater than 2 midnights.  SignedIna Homes, MD 07/10/2019, 8:40 AM  Pager: [phone]

## 2019-07-10 NOTE — ED Notes (Signed)
Patient wife Larry Byrd calling asking for an update [phone]

## 2019-07-10 NOTE — ED Notes (Signed)
Paged admitting team about diet, pt asking to eat. Page returned and MD states if pt passes a bedside swallow test he can have a regular diet. This Rn informed the MD that pt tolerated PO meds fine without difficulty.

## 2019-07-10 NOTE — Progress Notes (Signed)
Patient is agitated and combative. Despite multiple attempts of redirected patient refuses to sit down and refusing safety interventions. Patient has almost fallen multiple times. MD paged. Awaiting call back.

## 2019-07-10 NOTE — ED Provider Notes (Signed)
[facility] EMERGENCY DEPARTMENT Provider Note   CSN: [id number] Arrival date & time: 07/10/19  1740     History Chief Complaint  Patient presents with  . Seizures    Keiffer Piper is a 63 y.o. male.  HPI     This is a 63 year old male with a history of alcohol abuse and withdrawal seizure, hypertension presents from home with possible seizure activity.  Patient reports "I think I just fell."  He remembers falling to the ground and reports rubbing his left knee.  He continues to drink daily.  He reports that he drinks several beers per day.  Last drink was yesterday sometime.  He reports both headache and abdominal pain.  He is awake alert and oriented and able to provide history.  Denies any other drug use.  Denies any recent nausea vomiting.  Denies any chest pain, shortness of breath, cough.  Was admitted in October of this year for likely alcohol withdrawal seizure.  Past Medical History:  Diagnosis Date  . Alcohol dependence (HCC)   . Diverticulosis   . Enlarged prostate   . Hypertension   . Major depression, chronic     Patient Active Problem List   Diagnosis Date Noted  . Alcohol withdrawal seizure (HCC) 05/14/2019  . Malnutrition of moderate degree 09/13/2017  . Hypertension 09/10/2017  . AKI (acute kidney injury) (HCC) 09/10/2017  . Chest pain 09/10/2017  . Hyponatremia 09/10/2017  . Hypotension 09/10/2017  . Near syncope 09/10/2017  . Neck pain 09/10/2017    No past surgical history on file.     No family history on file.  Social History   Tobacco Use  . Smoking status: Current Every Day Smoker    Types: Cigarettes  . Smokeless tobacco: Never Used  Substance Use Topics  . Alcohol use: Yes  . Drug use: No    Home Medications Prior to Admission medications   Medication Sig Start Date End Date Taking? Authorizing Provider  amLODipine (NORVASC) 5 MG tablet Take 1 tablet (5 mg total) by mouth daily. Patient not taking:  Reported on 03/29/2019 10/08/17   Donnetta Hutching, MD  lisinopril-hydrochlorothiazide (PRINZIDE,ZESTORETIC) 20-12.5 MG tablet Take 1 tablet by mouth daily. Patient not taking: Reported on 03/29/2019 08/11/17   Elson Areas, PA-C  meloxicam (MOBIC) 7.5 MG tablet Take 1 tablet (7.5 mg total) by mouth daily. Patient not taking: Reported on 05/15/2019 04/25/19   Gilda Crease, MD  Multiple Vitamin (MULTIVITAMIN WITH MINERALS) TABS tablet Take 1 tablet by mouth daily. Patient not taking: Reported on 10/08/2017 09/14/17   Kendell Bane, MD  potassium chloride SA (K-DUR,KLOR-CON) 20 MEQ tablet Take 2 tablets (40 mEq total) by mouth daily. Patient not taking: Reported on 03/29/2019 08/11/17   Elson Areas, PA-C    Allergies    Patient has no known allergies.  Review of Systems   Review of Systems  Constitutional: Negative for fever.  Respiratory: Negative for shortness of breath.   Cardiovascular: Negative for chest pain.  Gastrointestinal: Positive for abdominal pain. Negative for nausea and vomiting.  Genitourinary: Negative for dysuria.  Musculoskeletal: Negative for back pain.       Left knee pain  Neurological: Positive for seizures and headaches.  All other systems reviewed and are negative.   Physical Exam Updated Vital Signs BP (!) 161/110 (BP Location: Left Arm)   Pulse (!) 121   Temp 97.8 F (36.6 C) (Oral)   Resp 18   SpO2 99%  Physical Exam Vitals and nursing note reviewed.  Constitutional:      Appearance: He is well-developed. He is not ill-appearing.  HENT:     Head: Normocephalic and atraumatic.     Mouth/Throat:     Mouth: Mucous membranes are dry.  Eyes:     Pupils: Pupils are equal, round, and reactive to light.  Cardiovascular:     Rate and Rhythm: Regular rhythm. Tachycardia present.     Heart sounds: Normal heart sounds. No murmur.  Pulmonary:     Effort: Pulmonary effort is normal. No respiratory distress.     Breath sounds: Normal breath sounds.  No wheezing.  Abdominal:     General: Bowel sounds are normal.     Palpations: Abdomen is soft.     Tenderness: There is no abdominal tenderness. There is no rebound.  Musculoskeletal:     Cervical back: Neck supple.     Comments: Left knee with no obvious deformity or swelling, abrasion noted  Lymphadenopathy:     Cervical: No cervical adenopathy.  Skin:    General: Skin is warm and dry.  Neurological:     Mental Status: He is alert and oriented to person, place, and time.     Comments: Fluent speech, 5 out of 5 strength in all 4 extremities, no dysmetria to finger-nose-finger  Psychiatric:        Mood and Affect: Mood normal.     ED Results / Procedures / Treatments   Labs (all labs ordered are listed, but only abnormal results are displayed) Labs Reviewed  COMPREHENSIVE METABOLIC PANEL - Abnormal; Notable for the following components:      Result Value   Sodium 132 (*)    Potassium 3.4 (*)    Chloride 91 (*)    CO2 19 (*)    Glucose, Bld 149 (*)    BUN <5 (*)    Creatinine, Ser 1.30 (*)    Total Protein 8.2 (*)    AST 50 (*)    GFR calc non Af Amer 58 (*)    Anion gap 22 (*)    All other components within normal limits  ACETAMINOPHEN LEVEL - Abnormal; Notable for the following components:   Acetaminophen (Tylenol), Serum <10 (*)    All other components within normal limits  CBC - Abnormal; Notable for the following components:   Platelets 142 (*)    All other components within normal limits  RAPID URINE DRUG SCREEN, HOSP PERFORMED - Abnormal; Notable for the following components:   Amphetamines POSITIVE (*)    All other components within normal limits  RESPIRATORY PANEL BY RT PCR (FLU A&B, COVID)  ETHANOL  SALICYLATE LEVEL  LIPASE, BLOOD    EKG EKG Interpretation  Date/Time:  Tuesday July 10 2019 05:47:15 EST Ventricular Rate:  106 PR Interval:    QRS Duration: 95 QT Interval:  371 QTC Calculation: 493 R Axis:   85 Text Interpretation: Sinus  tachycardia Consider left atrial enlargement Borderline right axis deviation Nonspecific T abnrm, anterolateral leads Borderline prolonged QT interval Confirmed by Ross MarcusHorton, Atalie Oros ([id number]) on 07/10/2019 6:47:57 AM   Radiology DG Knee Complete 4 Views Left  Result Date: 07/10/2019 CLINICAL DATA:  Left knee pain after fall. EXAM: LEFT KNEE - COMPLETE 4+ VIEW COMPARISON:  Left knee radiograph 07/15/2017 FINDINGS: No evidence of fracture, dislocation, or joint effusion. Trace peripheral spurring of the medial and lateral tibiofemoral compartment. Quadriceps tendon enthesophyte. Joint spaces are maintained. Soft tissues are unremarkable. There are vascular calcifications.  IMPRESSION: 1. No fracture or subluxation of the left knee. 2. Mild osteoarthritis. 3. Vascular calcifications. Electronically Signed   By: Keith Rake M.D.   On: 07/10/2019 05:38    Procedures Procedures (including critical care time)   CRITICAL CARE Performed by: Merryl Hacker   Total critical care time: 35 minutes  Critical care time was exclusive of separately billable procedures and treating other patients.  Critical care was necessary to treat or prevent imminent or life-threatening deterioration.  Critical care was time spent personally by me on the following activities: development of treatment plan with patient and/or surrogate as well as nursing, discussions with consultants, evaluation of patient's response to treatment, examination of patient, obtaining history from patient or surrogate, ordering and performing treatments and interventions, ordering and review of laboratory studies, ordering and review of radiographic studies, pulse oximetry and re-evaluation of patient's condition.  Medications Ordered in ED Medications  LORazepam (ATIVAN) injection 0-4 mg (2 mg Intravenous Given 07/10/19 0514)    Or  LORazepam (ATIVAN) tablet 0-4 mg ( Oral See Alternative 07/10/19 0514)  LORazepam (ATIVAN) injection  0-4 mg (has no administration in time range)    Or  LORazepam (ATIVAN) tablet 0-4 mg (has no administration in time range)  thiamine tablet 100 mg (has no administration in time range)    Or  thiamine (B-1) injection 100 mg (has no administration in time range)  LORazepam (ATIVAN) injection 1 mg (1 mg Intravenous Not Given 07/10/19 0541)  sodium chloride 0.9 % bolus 1,000 mL (1,000 mLs Intravenous New Bag/Given 07/10/19 0511)    ED Course  I have reviewed the triage vital signs and the nursing notes.  Pertinent labs & imaging results that were available during my care of the patient were reviewed by me and considered in my medical decision making (see chart for details).  Clinical Course as of Jul 09 653  Tue Jul 10, 2019  1700 Attempted to call his wife without answer.   [CH]    Clinical Course User Index [CH] Gunther Zawadzki, Barbette Hair, MD   MDM Rules/Calculators/A&P                      Patient presents with possible seizure.  Unable to talk to his wife but per nursing, wife described him stiffening in bed and not being responsive.  Has a recent admission in October for likely alcohol withdrawal seizure.  He reports continued alcohol use.  Last alcohol use was yesterday.  Alcohol level is less than 10.  He is nonfocal on exam.  He is complaining of some abdominal pain and nausea.  Lab work notable for metabolic derangements with an acute kidney injury, hypokalemia and hyponatremia which is mild, hypochloremia.  Anion gap of 22.  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  Additionally his CIWA score is 12.  Given his history, he will need admission for monitoring.  X-ray of his knee is negative for acute fracture or injury.  CT head is pending.  EKG without arrhythmia or ischemia.  Continues to be tachycardic.  Patient was given a liter of fluids and thiamine.   Final Clinical Impression(s) / ED Diagnoses Final diagnoses:  Alcohol withdrawal seizure without complication ([place])  Alcoholic ketoacidosis    Rx / DC  Orders ED Discharge Orders    None       Merryl Hacker, MD 07/10/19 [phone]

## 2019-07-11 LAB — BASIC METABOLIC PANEL
Anion gap: 9 (ref 5–15)
BUN: <5 mg/dL — ABNORMAL LOW (ref 8–23)
CO2: 24 mmol/L (ref 22–32)
Calcium: 9.1 mg/dL (ref 8.9–10.3)
Chloride: 102 mmol/L (ref 98–111)
Creatinine, Ser: 0.9 mg/dL (ref 0.61–1.24)
GFR calc Af Amer: >60 mL/min (ref >60)
GFR calc non Af Amer: >60 mL/min (ref >60)
Glucose, Bld: 81 mg/dL (ref 70–99)
Potassium: 3.9 mmol/L (ref 3.5–5.1)
Sodium: 135 mmol/L (ref 135–145)

## 2019-07-11 MED ORDER — HYDROCHLOROTHIAZIDE 12.5 MG PO CAPS
12.5000 mg | ORAL_CAPSULE | Freq: Every day | ORAL | Status: DC
  Administered 2019-07-11 – 2019-07-12 (×2): 12.5 mg via ORAL
  Filled 2019-07-11 (×2): qty 1

## 2019-07-11 MED ORDER — POTASSIUM CHLORIDE IN NACL 40-0.9 MEQ/L-% IV SOLN
INTRAVENOUS | Status: AC
  Administered 2019-07-11: 75 mL/h via INTRAVENOUS
  Filled 2019-07-11: qty 1000

## 2019-07-11 MED ORDER — LISINOPRIL 20 MG PO TABS
20.0000 mg | ORAL_TABLET | Freq: Every day | ORAL | Status: DC
  Administered 2019-07-11 – 2019-07-12 (×2): 20 mg via ORAL
  Filled 2019-07-11 (×2): qty 1

## 2019-07-11 NOTE — Plan of Care (Signed)
Patient currently eating breakfast, this nurse helped with set-up. Patient has consumed over 75% of breakfast meal at this time.

## 2019-07-11 NOTE — Plan of Care (Signed)
  Problem: Education: Goal: Knowledge of General Education information will improve Description: Including pain rating scale, medication(s)/side effects and non-pharmacologic comfort measures Outcome: Progressing   Problem: Health Behavior/Discharge Planning: Goal: Ability to manage health-related needs will improve Outcome: Progressing   Problem: Clinical Measurements: Goal: Ability to maintain clinical measurements within normal limits will improve Outcome: Progressing Goal: Will remain free from infection Outcome: Progressing Goal: Diagnostic test results will improve Outcome: Progressing Goal: Respiratory complications will improve Outcome: Progressing Goal: Cardiovascular complication will be avoided Outcome: Progressing   Problem: Activity: Goal: Risk for activity intolerance will decrease Outcome: Progressing   Problem: Nutrition: Goal: Adequate nutrition will be maintained Outcome: Progressing   Problem: Coping: Goal: Level of anxiety will decrease Outcome: Progressing   Problem: Elimination: Goal: Will not experience complications related to bowel motility Outcome: Progressing Goal: Will not experience complications related to urinary retention Outcome: Progressing   Problem: Pain Managment: Goal: General experience of comfort will improve Outcome: Progressing   Problem: Safety: Goal: Ability to remain free from injury will improve Outcome: Progressing   Problem: Skin Integrity: Goal: Risk for impaired skin integrity will decrease Outcome: Progressing   Problem: Education: Goal: Knowledge of disease or condition will improve Outcome: Progressing Goal: Understanding of discharge needs will improve Outcome: Progressing   Problem: Health Behavior/Discharge Planning: Goal: Ability to identify changes in lifestyle to reduce recurrence of condition will improve Outcome: Progressing Goal: Identification of resources available to assist in meeting health  care needs will improve Outcome: Progressing   Problem: Physical Regulation: Goal: Complications related to the disease process, condition or treatment will be avoided or minimized Outcome: Progressing   Problem: Safety: Goal: Ability to remain free from injury will improve Outcome: Progressing   Ival Bible, BSN, RN

## 2019-07-11 NOTE — Progress Notes (Signed)
IV found in patient's upon entering the room. When asked what happened the patient  Stated " I will take pills but I don't want this crap in my arm" Patient also refused to wear cardiac monitor, central tele is aware at this time. Patient educated on safety and importance of monitoring patient continues to refuse.

## 2019-07-11 NOTE — Evaluation (Signed)
Clinical/Bedside Swallow Evaluation Patient Details  Name: Larry Byrd MRN: [id number] Date of Birth: May 25, 1956  Today's Date: 07/11/2019 Time: SLP Start Time (ACUTE ONLY): 1406 SLP Stop Time (ACUTE ONLY): 1414 SLP Time Calculation (min) (ACUTE ONLY): 8 min  Past Medical History:  Past Medical History:  Diagnosis Date  . Alcohol dependence ([place])   . Diverticulosis   . Enlarged prostate   . Hypertension   . Major depression, chronic    Past Surgical History: History reviewed. No pertinent surgical history. HPI:  Larry Byrd is a 63 year old male with hypertension and alcohol use disorder who presented to the emergency department with concerns of seizure like activity. Per MD note does endorse a choking sensation when he tries to eat. He states that this is limited is PO intake for the past three months.   Assessment / Plan / Recommendation Clinical Impression  Pt has history of significant ETOH abuse and stated he "gets strangled sometime" more with liquid. Therapist would have suspected more difficulty with food given possible esophageal component due to alcohol. He did not demonstrate any s/s aspiration or esophageal deficits with thin or solid. Educated pt on esophageal strategies such as remain upright after meals, alternate liquids and solids. Recommend continue regular/thin. No follow up needed.    SLP Visit Diagnosis: Dysphagia, unspecified (R13.10)    Aspiration Risk  Mild aspiration risk    Diet Recommendation Regular;Thin liquid   Liquid Administration via: Cup;Straw Medication Administration: Whole meds with liquid Supervision: Patient able to self feed Compensations: Slow rate;Small sips/bites Postural Changes: Seated upright at 90 degrees    Other  Recommendations Oral Care Recommendations: Oral care BID   Follow up Recommendations None      Frequency and Duration            Prognosis        Swallow Study   General HPI: Larry Byrd is a  63 year old male with hypertension and alcohol use disorder who presented to the emergency department with concerns of seizure like activity. Per MD note does endorse a choking sensation when he tries to eat. He states that this is limited is PO intake for the past three months. Type of Study: Bedside Swallow Evaluation Previous Swallow Assessment: (none) Diet Prior to this Study: Regular;Thin liquids Temperature Spikes Noted: No Respiratory Status: Room air History of Recent Intubation: No Behavior/Cognition: Alert;Cooperative;Pleasant mood;Requires cueing Oral Cavity Assessment: Within Functional Limits Oral Care Completed by SLP: No Oral Cavity - Dentition: (missing several?) Vision: Functional for self-feeding Self-Feeding Abilities: Able to feed self Patient Positioning: Upright in bed Baseline Vocal Quality: Normal Volitional Cough: Strong Volitional Swallow: Able to elicit    Oral/Motor/Sensory Function Overall Oral Motor/Sensory Function: Within functional limits   Ice Chips Ice chips: Not tested   Thin Liquid Thin Liquid: Within functional limits Presentation: Cup;Straw    Nectar Thick Nectar Thick Liquid: Not tested   Honey Thick Honey Thick Liquid: Not tested   Puree Puree: Not tested   Solid     Solid: Within functional limits      Houston Siren 07/11/2019,3:04 PM   Orbie Pyo Colvin Caroli.Ed Risk analyst [phone] Office [phone]

## 2019-07-11 NOTE — TOC Initial Note (Signed)
Transition of Care [facility]) - Initial/Assessment Note    Patient Details  Name: Larry Byrd MRN: [id number] Date of Birth: 12-21-1955  [facility] CM/SW Contact:    Pollie Friar, RN Phone Number: 07/11/2019, 2:06 PM  Clinical Narrative:                 CM is familiar with patient from last admission. At that time rehab was recommended but patient refused and d/c back to the street with significant other.  Pt continues to be homeless, states he stays in motels when they can afford it.  Pt d/ced last admission with rollator.  CM provided him resources for alcohol counseling: online/ inpatient and outpatient. He voiced understanding but no interest. These resources provided at last hospital visit also.  TOC following for d/c needs.  Expected Discharge Plan: (homeless) Barriers to Discharge: Homeless with medical needs   Patient Goals and CMS Choice        Expected Discharge Plan and Services Expected Discharge Plan: (homeless) In-house Referral: Clinical Social Work Discharge Planning Services: CM Consult   Living arrangements for the past 2 months: Homeless                                      Prior Living Arrangements/Services Living arrangements for the past 2 months: Homeless Lives with:: Significant Other Patient language and need for interpreter reviewed:: Yes Do you feel safe going back to the place where you live?: Yes        Care giver support system in place?: No (comment)(significant other unable to provide needed assistance)   Criminal Activity/Legal Involvement Pertinent to Current Situation/Hospitalization: No - Comment as needed  Activities of Daily Living Home Assistive Devices/Equipment: Environmental consultant (specify type) ADL Screening (condition at time of admission) Patient's cognitive ability adequate to safely complete daily activities?: Yes Is the patient deaf or have difficulty hearing?: No Does the patient have difficulty seeing,  even when wearing glasses/contacts?: No Does the patient have difficulty concentrating, remembering, or making decisions?: No Patient able to express need for assistance with ADLs?: Yes Does the patient have difficulty dressing or bathing?: No Independently performs ADLs?: Yes (appropriate for developmental age) Does the patient have difficulty walking or climbing stairs?: Yes Weakness of Legs: Both Weakness of Arms/Hands: None  Permission Sought/Granted                  Emotional Assessment   Attitude/Demeanor/Rapport: Engaged Affect (typically observed): Accepting Orientation: : Oriented to Self, Oriented to Place Alcohol / Substance Use: Alcohol Use(information provided on alcohol counseling) Psych Involvement: No (comment)  Admission diagnosis:  Alcoholic ketoacidosis [D63.8] Alcohol withdrawal seizure ([place]) [V56.433, R56.9] Alcohol withdrawal seizure without complication ([place]) [I95.188, R56.9] Patient Active Problem List   Diagnosis Date Noted  . High anion gap metabolic acidosis 41/66/0630  . Oropharyngeal dysphagia 07/10/2019  . Alcohol withdrawal seizure ([place]) 05/14/2019  . Malnutrition of moderate degree 09/13/2017  . Hypertension 09/10/2017  . AKI (acute kidney injury) ([place]) 09/10/2017  . Chest pain 09/10/2017  . Hyponatremia 09/10/2017  . Hypotension 09/10/2017  . Near syncope 09/10/2017  . Neck pain 09/10/2017   PCP:  Patient, No Pcp Per Pharmacy:   [place] ([place]), [place] - [address] [place] ([place]) [place] [address] Phone: [phone] Fax: [phone]     Social Determinants of Health (SDOH) Interventions  Readmission Risk Interventions No flowsheet data found.

## 2019-07-11 NOTE — Social Work (Signed)
TOC team acknowledging consult for ETOH/substance use resources. Pt homeless, provided resources during prior admission discharge on 10/26. RNCM to f/u as she is familiar with pt.   Westley Hummer, MSW, [place] Work [phone]

## 2019-07-11 NOTE — Progress Notes (Addendum)
   Subjective:  Pt seen at the bedside this morning. Pt was asleep and woken up to check on him. Pt reported he wanted to sleep but was feeling fine.  Objective:  Vital signs in last 24 hours: Vitals:   07/11/19 0025 07/11/19 0030 07/11/19 0412 07/11/19 0726  BP: (!) 168/113 (!) 142/86 (!) 140/94 (!) 163/90  Pulse: (!) 105 87 87 86  Resp: 19  18   Temp: 97.6 F (36.4 C)  97.7 F (36.5 C) 97.8 F (36.6 C)  TempSrc: Oral  Oral Oral  SpO2:   100% 100%  Weight:      Height:       Weight change:   Intake/Output Summary (Last 24 hours) at 07/11/2019 1102 Last data filed at 07/11/2019 1030 Gross per 24 hour  Intake 1248.19 ml  Output 1800 ml  Net -551.81 ml   Physical Exam Constitutional:      General: He is not in acute distress.    Appearance: He is not diaphoretic.  HENT:     Mouth/Throat:     Comments: Brown remnants of food on top lip of mask worn directly in front of mouth Neurological:     General: No focal deficit present.     Mental Status: He is alert.  Psychiatric:     Comments: Pt calm upon waking and only annoyed at being awoken. Wanted to go back to sleep.    Labs: K: 3.9, CO3: 24, BUN: <5, Cr 0.9 AG: 9 Lactic acid: 1.7   Assessment/Plan:  Principal Problem:   Alcohol withdrawal seizure ([place]) Active Problems:   Hypertension   AKI (acute kidney injury) ([place])   High anion gap metabolic acidosis   Oropharyngeal dysphagia  Larry Byrd is a 63 year old male with hypertension and alcohol use disorder who presented with alcohol withdrawal seizures. He has mildly elevated pressures but is otherwise clinically stable. He has been agitated but now has a CIWA score of 4.  Alcohol withdrawal seizure Alcohol use disorder Pt has not had seizure-like activity since admission. Pt's episode of agitation last night has since resolved. Pt should be monitored for other possible signs of delirium tremens. CIWA score trended from 11 at time of episode to 4.  -  Continue seizure precautions - Continue thiamine and folic acid supplemenatation - CIWA protocol with PRN IV Ativan - Will add librium if continuing to require significant doses of ativan - Pt interested in naltrexone therapy for alcohol cessation - Order social work consult for financial coverage of naltrexone.   AKI  Hypokalemia Anion gap metabolic acidosis with metabolic alkalosis - K 3.9. Hypokalemia is resolving on IV KCl 40mg  replacement - AG: 9, Anion gap has closed. HCO3, Metabolic acidosis has resolved. - Cr: 0.9, AKI has improved - Continue IV NS bolus as needed  Hypertension AKI has resolved, pressures are mildly elevated - Restart lisinopril 20 mg PO daily and hydrochlorothiazide 12.5 mg PO daily - Continue amlodipine 5 mg once daily.   Oropharyngeal dysphagia Pt endorses difficulty swallowing and eating less as a result for the past 3 months. - Swallow evaluation ordered  Thrombocytopenia  Plt 142 (12/15). Most likely related to EtOH use disorder - Recheck CBC in AM  Diet: regular DVT Ppe: Lovenox 40 mg   LOS: 1 day   Carin Primrose, Medical Student 07/11/2019, 11:02 AM

## 2019-07-12 DIAGNOSIS — G40509 Epileptic seizures related to external causes, not intractable, without status epilepticus: Secondary | ICD-10-CM

## 2019-07-12 DIAGNOSIS — Z590 Homelessness: Secondary | ICD-10-CM

## 2019-07-12 DIAGNOSIS — F10239 Alcohol dependence with withdrawal, unspecified: Principal | ICD-10-CM

## 2019-07-12 MED ORDER — THIAMINE HCL 100 MG PO TABS: 100.0000 mg | ORAL_TABLET | Freq: Every day | ORAL | 0 refills | Status: AC

## 2019-07-12 MED ORDER — FOLIC ACID 1 MG PO TABS: 1.0000 mg | ORAL_TABLET | Freq: Every day | ORAL | 0 refills | Status: AC

## 2019-07-12 MED ORDER — THIAMINE HCL 100 MG PO TABS: 100.0000 mg | ORAL_TABLET | Freq: Every day | ORAL | 0 refills | Status: DC

## 2019-07-12 MED ORDER — NALTREXONE HCL 50 MG PO TABS: 50.0000 mg | ORAL_TABLET | Freq: Every day | ORAL | 0 refills | Status: AC

## 2019-07-12 MED ORDER — FOLIC ACID 1 MG PO TABS: 1.0000 mg | ORAL_TABLET | Freq: Every day | ORAL | 0 refills | Status: DC

## 2019-07-12 MED FILL — VITAMIN B-1 100 MG TABS: 100 | 30 days supply | Qty: 30 | Fill #0

## 2019-07-12 MED FILL — FOLIC ACID 1 MG TABS: 1 | 30 days supply | Qty: 30 | Fill #0

## 2019-07-12 MED FILL — NALTREXONE 50 MG TABLET: 50 | 30 days supply | Qty: 30 | Fill #0

## 2019-07-12 NOTE — Progress Notes (Signed)
   Subjective:  Pt seen at the bedside this morning. He reports he had a headache that has now gotten better. He reports feeling fine. He says he would be ready to go as soon as he talks to social work. Pt reports he has no where to go and he is going to "head to a bench" as soon as he leaves the hospital.  Objective:  Vital signs in last 24 hours: Vitals:   07/11/19 1941 07/12/19 0004 07/12/19 0410 07/12/19 0752  BP: (!) 131/96 113/76 120/81 114/82  Pulse: 92 88 85 79  Resp: 18 17 18 20   Temp: 98 F (36.7 C) 98.5 F (36.9 C) 98.1 F (36.7 C) 98.4 F (36.9 C)  TempSrc: Oral Oral Oral Oral  SpO2: 100% 100% 100% 100%  Weight:      Height:       Weight change:   Intake/Output Summary (Last 24 hours) at 07/12/2019 1027 Last data filed at 07/12/2019 0754 Gross per 24 hour  Intake 611.05 ml  Output 1750 ml  Net -1138.95 ml   Physical Exam Constitutional:      General: He is not in acute distress.    Appearance: He is not ill-appearing.     Comments: Pt is tired-appearing  HENT:     Mouth/Throat:     Mouth: Mucous membranes are moist.  Cardiovascular:     Rate and Rhythm: Normal rate and regular rhythm.  Musculoskeletal:        General: No swelling.  Skin:    General: Skin is warm and dry.  Neurological:     General: No focal deficit present.     Mental Status: Mental status is at baseline.  Psychiatric:        Mood and Affect: Mood normal.    CIWA Score: 2   Assessment/Plan:  Principal Problem:   Alcohol withdrawal seizure ([place]) Active Problems:   Hypertension   AKI (acute kidney injury) ([place])   High anion gap metabolic acidosis   Oropharyngeal dysphagia  Larry Byrd is a 63 year-old male with a history of HTN and alcohol use disorder who presents with seizures determined to be related to alcohol withdrawal. Pt is hemodynamically stable and clinically improved  Alcohol withdrawal seizure: No seizure activity for over 48 hours. CIWA improved. Pt is  clinically ready for discharge. Pt is homeless and is in need of financial assistance with regard to naltrexone prescription and regarding housing. - Order social work consult for naltrexone prescription and housing resources  AKI Hypokalemia AG metabolic acidosis with metabolic alkalosis Labs unchanged from yesterday. AKI, Hypokalemia, and metabolic acidosis have resolved. Anion gap has closed - D/c IV NS - Continue to monitor  Oropharyngeal dysphagia Swallow evaluation found to deficit, no signs of aspiration or esophageal problems found.  HTN - Continue amlodipine 5 mg once daily, lisinopril 20 mg PO daily and hydrochlorothiazide 12.5 mg PO daily   LOS: 2 days   Carin Primrose, Medical Student 07/12/2019, 10:27 AM

## 2019-07-12 NOTE — Care Management (Signed)
Per Gae Dry. W/[facility] [phone] Co-pay amount for Naltrexone 65m daily 30 day supply retail pharmacy $7.00. Mail order pharmacy $7.00 for 90 day supply.  No PA required Deductible has not been met. Tier 2 medication Retail Pharmacies are: [facility], [phone] Ref# [id number]

## 2019-07-12 NOTE — Progress Notes (Signed)
CSW received a call from pt's RN stating pt is D/C'ing and is asking for additional clothing.  CSW is covering remotely and provided the # for the ED Secretary so that the location of the ED clothing closet can be determined.  [phone].  CSW stranding by should any additional social work needs arise.  CSW will continue to follow for D/C needs.  Alphonse Guild. Zubair Lofton, LCSW, LCAS, CSI Transitions of Care Clinical Social Worker Care Coordination Department Ph: [phone]

## 2019-07-12 NOTE — TOC Transition Note (Signed)
Transition of Care [facility]) - CM/SW Discharge Note   Patient Details  Name: Larry Byrd MRN: [id number] Date of Birth: [birth year]  Transition of Care [facility]) CM/SW Contact:  Pollie Friar, RN Phone Number: 07/12/2019, 12:53 PM   Clinical Narrative:    CM called shelters in this area and none had any available beds. CM updated the patient.  D/c meds sent to [place] and CM assisted with co pays.  Pt concerned about his significant other that is not here at the hospital. Asking to stay until she arrives again. CM updated him that he is d/ced. CM has offer transportation to where he wants to go. Awaiting him to make this decision. Bedside RN updated.   Final next level of care: Other (comment)(Homeless) Barriers to Discharge: Homeless with medical needs, Barriers Unresolved (comment)   Patient Goals and CMS Choice        Discharge Placement                       Discharge Plan and Services In-house Referral: Clinical Social Work Discharge Planning Services: CM Consult                                 Social Determinants of Health (SDOH) Interventions     Readmission Risk Interventions No flowsheet data found.

## 2019-07-12 NOTE — Discharge Summary (Signed)
   Name: Larry Byrd MRN: [id number] DOB: [birth year] 63 y.o. PCP: Patient, No Pcp Per  Date of Admission: 07/10/2019  3:19 AM Date of Discharge: 07/12/19 Attending Physician: Oda Kilts, MD  Discharge Diagnosis: 1. Alcohol withdrawl 2. Alcohol Dependence Disorder  Discharge Medications: Allergies as of 07/12/2019   No Known Allergies     Medication List    STOP taking these medications   meloxicam 7.5 MG tablet Commonly known as: Mobic     TAKE these medications   amLODipine 5 MG tablet Commonly known as: NORVASC Take 1 tablet (5 mg total) by mouth daily.   folic acid 1 MG tablet Commonly known as: FOLVITE Take 1 tablet (1 mg total) by mouth daily. Start taking on: July 13, 2019   lisinopril-hydrochlorothiazide 20-12.5 MG tablet Commonly known as: ZESTORETIC Take 1 tablet by mouth daily.   multivitamin with minerals Tabs tablet Take 1 tablet by mouth daily.   potassium chloride SA 20 MEQ tablet Commonly known as: KLOR-CON Take 2 tablets (40 mEq total) by mouth daily.   thiamine 100 MG tablet Take 1 tablet (100 mg total) by mouth daily. Start taking on: July 13, 2019       Disposition and follow-up:   Mr.Larry Byrd was discharged from [facility] in Stable condition.  At the hospital follow up visit please address:  Alcohol Dependence Disorder -patient started on naltrexone 50mg  daily on discharge. Please reinforce the importance of continuing this daily to assist with alcohol cravings.  2.  Labs / imaging needed at time of follow-up: none  Follow-up Appointments: Follow-up [place] Follow up in 1 week(s).   Contact information: [address] [place] Hospital Course by problem list: Larry Byrd is a 63 yo homeless gentleman with a PMH of alcohol dependence. Pt presented on 07/10/19 with seizure-like activity  lasting for about 1-2 minutes, followed by a period of going limp and remaining unresponsive which occurred 12-24 hours after his last drink of alcohol. Upon admission, he was placed on CIWA protocol with Ativan. CIWAs improved over the course of his hospitalization and no further seizure like activity was observed. He was monitored for two more days as CIWA score improved to 2. He expressed interest in alcohol cessation. Upon discharge, naltrexone was prescribed to assist with cravings.  His alcohol dependence and wish for cessation is complicated by his lack of social support, financial barriers and lack of PCP. SW and case management was consulted and arranged for medications to be delivered to the floor. He is to follow up in the internal medicine clinic in 1 week for re-evaluation.   Discharge Vitals:   BP 114/82 (BP Location: Left Arm)   Pulse 79   Temp 98.4 F (36.9 C) (Oral)   Resp 20   Ht 5\' 10"  (1.778 m)   Wt 52 kg   SpO2 100%   BMI 16.45 kg/m   Pertinent Labs, Studies, and Procedures:  07/10/19 CT head: no acute findings  Discharge Instructions: Discharge Instructions    Diet - low sodium heart healthy   Complete by: As directed       Signed: Mitzi Hansen, MD 07/12/2019, 10:39 AM   Pager: [phone]

## 2019-07-12 NOTE — Discharge Instructions (Signed)

## 2019-07-23 ENCOUNTER — Ambulatory Visit: Payer: Medicare Other

## 2019-08-12 ENCOUNTER — Emergency Department (HOSPITAL_COMMUNITY)
Admission: EM | Admit: 2019-08-12 | Discharge: 2019-08-13 | Disposition: A | Discharge status: Home / Self Care | Payer: Medicare Other | Attending: Emergency Medicine | Admitting: Emergency Medicine

## 2019-08-12 ENCOUNTER — Emergency Department (HOSPITAL_COMMUNITY): Payer: Medicare Other

## 2019-08-12 ENCOUNTER — Other Ambulatory Visit: Payer: Self-pay

## 2019-08-12 DIAGNOSIS — Z79899 Other long term (current) drug therapy: Secondary | ICD-10-CM | POA: Insufficient documentation

## 2019-08-12 DIAGNOSIS — R4182 Altered mental status, unspecified: Secondary | ICD-10-CM | POA: Insufficient documentation

## 2019-08-12 DIAGNOSIS — I10 Essential (primary) hypertension: Secondary | ICD-10-CM | POA: Insufficient documentation

## 2019-08-12 DIAGNOSIS — F10920 Alcohol use, unspecified with intoxication, uncomplicated: Secondary | ICD-10-CM

## 2019-08-12 DIAGNOSIS — F17210 Nicotine dependence, cigarettes, uncomplicated: Secondary | ICD-10-CM | POA: Diagnosis not present

## 2019-08-12 LAB — COMPREHENSIVE METABOLIC PANEL
ALT: 60 U/L — ABNORMAL HIGH (ref 0–44)
AST: 83 U/L — ABNORMAL HIGH (ref 15–41)
Albumin: 4.3 g/dL (ref 3.5–5.0)
Alkaline Phosphatase: 88 U/L (ref 38–126)
Anion gap: 16 — ABNORMAL HIGH (ref 5–15)
BUN: 11 mg/dL (ref 8–23)
CO2: 22 mmol/L (ref 22–32)
Calcium: 8.5 mg/dL — ABNORMAL LOW (ref 8.9–10.3)
Chloride: 101 mmol/L (ref 98–111)
Creatinine, Ser: 0.95 mg/dL (ref 0.61–1.24)
GFR calc Af Amer: >60 mL/min (ref >60)
GFR calc non Af Amer: >60 mL/min (ref >60)
Glucose, Bld: 85 mg/dL (ref 70–99)
Potassium: 3.6 mmol/L (ref 3.5–5.1)
Sodium: 139 mmol/L (ref 135–145)
Total Bilirubin: 0.5 mg/dL (ref 0.3–1.2)
Total Protein: 8 g/dL (ref 6.5–8.1)

## 2019-08-12 LAB — CBC
HCT: 41.6 % (ref 39.0–52.0)
Hemoglobin: 14.2 g/dL (ref 13.0–17.0)
MCH: 33.4 pg (ref 26.0–34.0)
MCHC: 34.1 g/dL (ref 30.0–36.0)
MCV: 97.9 fL (ref 80.0–100.0)
Platelets: 150 10*3/uL (ref 150–400)
RBC: 4.25 MIL/uL (ref 4.22–5.81)
RDW: 14.4 % (ref 11.5–15.5)
WBC: 2.8 10*3/uL — ABNORMAL LOW (ref 4.0–10.5)
nRBC: 0 % (ref 0.0–0.2)

## 2019-08-12 LAB — ETHANOL: Alcohol, Ethyl (B): 464 mg/dL (ref <10)
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
## 2019-08-12 NOTE — ED Notes (Signed)
Pt. Documented in error see above note in chart. 
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
## 2019-08-12 NOTE — ED Notes (Signed)
Pt ambulated to restroom, unassisted with a steady gait.

## 2019-08-12 NOTE — ED Notes (Signed)
Date and time results received: 08/12/19 "10:51 PM  Test: ETOH Critical Value: 464 Name of Provider Notified: Silverio Lay Orders Received? Or Actions Taken?:

## 2019-08-13 NOTE — Discharge Instructions (Signed)
Thank you for allowing me to care for you today in the Emergency Department.   Your work-up today was consistent with alcohol intoxication.  You were observed for several hours.  I have included resources if you would like assistance with stopping drinking.  Return to the emergency department if you have a seizure, fall, uncontrollable vomiting, or other new, concerning symptoms.

## 2019-09-28 ENCOUNTER — Other Ambulatory Visit: Payer: Self-pay

## 2019-09-28 ENCOUNTER — Encounter (HOSPITAL_COMMUNITY): Payer: Self-pay | Admitting: Emergency Medicine

## 2019-09-28 ENCOUNTER — Emergency Department (HOSPITAL_COMMUNITY)
Admission: EM | Admit: 2019-09-28 | Discharge: 2019-09-28 | Disposition: A | Discharge status: Home / Self Care | Payer: Medicare Other | Attending: Emergency Medicine | Admitting: Emergency Medicine

## 2019-09-28 DIAGNOSIS — Y908 Blood alcohol level of 240 mg/100 ml or more: Secondary | ICD-10-CM | POA: Insufficient documentation

## 2019-09-28 DIAGNOSIS — F10129 Alcohol abuse with intoxication, unspecified: Secondary | ICD-10-CM | POA: Diagnosis not present

## 2019-09-28 DIAGNOSIS — I10 Essential (primary) hypertension: Secondary | ICD-10-CM | POA: Diagnosis not present

## 2019-09-28 DIAGNOSIS — F1010 Alcohol abuse, uncomplicated: Secondary | ICD-10-CM

## 2019-09-28 DIAGNOSIS — F17210 Nicotine dependence, cigarettes, uncomplicated: Secondary | ICD-10-CM | POA: Diagnosis not present

## 2019-09-28 DIAGNOSIS — M79671 Pain in right foot: Secondary | ICD-10-CM

## 2019-09-28 DIAGNOSIS — M79672 Pain in left foot: Secondary | ICD-10-CM | POA: Insufficient documentation

## 2019-09-28 DIAGNOSIS — F10920 Alcohol use, unspecified with intoxication, uncomplicated: Secondary | ICD-10-CM

## 2019-09-28 DIAGNOSIS — Z79899 Other long term (current) drug therapy: Secondary | ICD-10-CM | POA: Diagnosis not present

## 2019-09-28 LAB — ETHANOL: Alcohol, Ethyl (B): 448 mg/dL (ref <10)

## 2019-09-28 NOTE — ED Provider Notes (Signed)
Patient signed out at end of shift. Here for alcohol intoxication and bilateral foot pain/blisters from walking.   At the time of sign out, he has been observed in the ED for over 6 hours. VSS. He is eating and drinking. He is awake and is "grateful for the rest". He is oriented and alert.   He can be discharged home with shelter and social work resources.   Elpidio Anis, PA-C 09/28/19 2225    Lorre Nick, MD 09/28/19 2242

## 2019-09-28 NOTE — ED Notes (Signed)
Security took a bottle of liquor from pt

## 2019-09-28 NOTE — ED Provider Notes (Signed)
[facility] EMERGENCY DEPARTMENT Provider Note   CSN: [id number] Arrival date & time: 09/28/19  1558     History Chief Complaint  Patient presents with  . Foot Pain  . Alcohol Intoxication    Larry Byrd is a 64 y.o. male.  Pt admits to alcohol abuse.  Pt was drinking from a bottle of liquor in the lobby while waiting to be seen.  Pt tells me Larry Byrd is tired, feet hurt from walking and Larry Byrd feels bad   The history is provided by the patient. No language interpreter was used.  Alcohol Intoxication This is a recurrent problem. The problem occurs constantly. Nothing aggravates the symptoms. Nothing relieves the symptoms. Larry Byrd has tried nothing for the symptoms. The treatment provided no relief.       Past Medical History:  Diagnosis Date  . Alcohol dependence ([place])   . Diverticulosis   . Enlarged prostate   . Hypertension   . Major depression, chronic     Patient Active Problem List   Diagnosis Date Noted  . High anion gap metabolic acidosis 24/26/8341  . Oropharyngeal dysphagia 07/10/2019  . Alcohol withdrawal seizure ([place]) 05/14/2019  . Malnutrition of moderate degree 09/13/2017  . Hypertension 09/10/2017  . AKI (acute kidney injury) ([place]) 09/10/2017  . Chest pain 09/10/2017  . Hyponatremia 09/10/2017  . Hypotension 09/10/2017  . Near syncope 09/10/2017  . Neck pain 09/10/2017    History reviewed. No pertinent surgical history.     No family history on file.  Social History   Tobacco Use  . Smoking status: Current Every Day Smoker    Types: Cigarettes  . Smokeless tobacco: Never Used  Substance Use Topics  . Alcohol use: Yes  . Drug use: No    Home Medications Prior to Admission medications   Medication Sig Start Date End Date Taking? Authorizing Provider  folic acid (FOLVITE) 1 MG tablet Take 1 tablet (1 mg total) by mouth daily. 07/13/19   Mitzi Hansen, MD  naltrexone (DEPADE) 50 MG tablet Take 1 tablet (50 mg total) by mouth  daily. 07/12/19   Mitzi Hansen, MD  thiamine 100 MG tablet Take 1 tablet (100 mg total) by mouth daily. 07/13/19   Mitzi Hansen, MD  amLODipine (NORVASC) 5 MG tablet Take 1 tablet (5 mg total) by mouth daily. Patient not taking: Reported on 03/29/2019 10/08/17 08/13/19  Nat Christen, MD  lisinopril-hydrochlorothiazide (PRINZIDE,ZESTORETIC) 20-12.5 MG tablet Take 1 tablet by mouth daily. Patient not taking: Reported on 03/29/2019 08/11/17 08/13/19  Fransico Meadow, PA-C  potassium chloride SA (K-DUR,KLOR-CON) 20 MEQ tablet Take 2 tablets (40 mEq total) by mouth daily. Patient not taking: Reported on 03/29/2019 08/11/17 08/13/19  Fransico Meadow, PA-C    Allergies    Patient has no known allergies.  Review of Systems   Review of Systems  All other systems reviewed and are negative.   Physical Exam Updated Vital Signs BP (!) 108/96   Pulse 82   Temp 97.6 F (36.4 C) (Oral)   Resp 18   SpO2 100%   Physical Exam Vitals and nursing note reviewed.  Constitutional:      Appearance: Larry Byrd is well-developed.  HENT:     Head: Normocephalic and atraumatic.  Eyes:     Conjunctiva/sclera: Conjunctivae normal.  Cardiovascular:     Rate and Rhythm: Normal rate.     Heart sounds: No murmur.  Pulmonary:     Effort: Pulmonary effort is normal. No respiratory distress.  Abdominal:  Tenderness: There is no abdominal tenderness.  Musculoskeletal:        General: Normal range of motion.     Comments: Feet, no ulcers, no infection  Skin:    General: Skin is warm and dry.  Neurological:     General: No focal deficit present.     Mental Status: Larry Byrd is alert.  Psychiatric:        Mood and Affect: Mood normal.     Comments: Pt seems acutely intoxicated.       ED Results / Procedures / Treatments   Labs (all labs ordered are listed, but only abnormal results are displayed) Labs Reviewed - No data to display  EKG None  Radiology No results found.  Procedures Procedures (including  critical care time)  Medications Ordered in ED Medications - No data to display  ED Course  I have reviewed the triage vital signs and the nursing notes.  Pertinent labs & imaging results that were available during my care of the patient were reviewed by me and considered in my medical decision making (see chart for details).    MDM Rules/Calculators/A&P                      MDM:  I will check etoh level.  Pt given food.  Pt will need to sober clinically before Larry Byrd can be safely discharged Pt's care turned over to Margy Clarks at 7pm  Final Clinical Impression(s) / ED Diagnoses Final diagnoses:  Acute alcoholic intoxication without complication [facility])    Rx / DC Orders ED Discharge Orders    None       Osie Cheeks 09/28/19 Colon Flattery, MD 09/29/19 2027

## 2019-09-28 NOTE — ED Triage Notes (Signed)
Pt arrives via EMS with reports of bilateral foot pain and etoh intoxication. States it feels like electricity is running through his feet

## 2019-09-28 NOTE — ED Notes (Signed)
Pt drinking alcohol in triage. Security called to assist.

## 2019-09-29 ENCOUNTER — Other Ambulatory Visit: Payer: Self-pay

## 2019-09-29 ENCOUNTER — Emergency Department (HOSPITAL_COMMUNITY): Payer: Medicare Other

## 2019-09-29 ENCOUNTER — Emergency Department (HOSPITAL_COMMUNITY)
Admission: EM | Admit: 2019-09-29 | Discharge: 2019-09-29 | Disposition: A | Discharge status: Home / Self Care | Payer: Medicare Other | Attending: Emergency Medicine | Admitting: Emergency Medicine

## 2019-09-29 ENCOUNTER — Emergency Department (HOSPITAL_COMMUNITY)
Admission: EM | Admit: 2019-09-29 | Discharge: 2019-09-30 | Disposition: A | Discharge status: Home / Self Care | Payer: Medicare Other | Source: Home / Self Care | Attending: Emergency Medicine | Admitting: Emergency Medicine

## 2019-09-29 ENCOUNTER — Encounter (HOSPITAL_COMMUNITY): Payer: Self-pay | Admitting: *Deleted

## 2019-09-29 DIAGNOSIS — I4891 Unspecified atrial fibrillation: Secondary | ICD-10-CM | POA: Insufficient documentation

## 2019-09-29 DIAGNOSIS — R079 Chest pain, unspecified: Secondary | ICD-10-CM | POA: Diagnosis not present

## 2019-09-29 DIAGNOSIS — F10920 Alcohol use, unspecified with intoxication, uncomplicated: Secondary | ICD-10-CM | POA: Insufficient documentation

## 2019-09-29 DIAGNOSIS — Z79899 Other long term (current) drug therapy: Secondary | ICD-10-CM | POA: Insufficient documentation

## 2019-09-29 DIAGNOSIS — U071 COVID-19: Secondary | ICD-10-CM | POA: Insufficient documentation

## 2019-09-29 DIAGNOSIS — Y904 Blood alcohol level of 80-99 mg/100 ml: Secondary | ICD-10-CM | POA: Insufficient documentation

## 2019-09-29 DIAGNOSIS — Z20822 Contact with and (suspected) exposure to covid-19: Secondary | ICD-10-CM | POA: Insufficient documentation

## 2019-09-29 DIAGNOSIS — M542 Cervicalgia: Secondary | ICD-10-CM | POA: Insufficient documentation

## 2019-09-29 DIAGNOSIS — M79671 Pain in right foot: Secondary | ICD-10-CM | POA: Diagnosis present

## 2019-09-29 DIAGNOSIS — F17210 Nicotine dependence, cigarettes, uncomplicated: Secondary | ICD-10-CM | POA: Insufficient documentation

## 2019-09-29 DIAGNOSIS — R0602 Shortness of breath: Secondary | ICD-10-CM | POA: Diagnosis not present

## 2019-09-29 DIAGNOSIS — I10 Essential (primary) hypertension: Secondary | ICD-10-CM | POA: Insufficient documentation

## 2019-09-29 DIAGNOSIS — B353 Tinea pedis: Secondary | ICD-10-CM | POA: Diagnosis not present

## 2019-09-29 DIAGNOSIS — F10230 Alcohol dependence with withdrawal, uncomplicated: Secondary | ICD-10-CM | POA: Insufficient documentation

## 2019-09-29 DIAGNOSIS — F10930 Alcohol use, unspecified with withdrawal, uncomplicated: Secondary | ICD-10-CM

## 2019-09-29 DIAGNOSIS — F10229 Alcohol dependence with intoxication, unspecified: Secondary | ICD-10-CM | POA: Insufficient documentation

## 2019-09-29 HISTORY — DX: Alcohol abuse, uncomplicated: F10.10

## 2019-09-29 LAB — CBC WITH DIFFERENTIAL/PLATELET
Abs Immature Granulocytes: 0.04 10*3/uL (ref 0.00–0.07)
Basophils Absolute: 0 10*3/uL (ref 0.0–0.1)
Basophils Relative: 0 %
Eosinophils Absolute: 0 10*3/uL (ref 0.0–0.5)
Eosinophils Relative: 0 %
HCT: 41.7 % (ref 39.0–52.0)
Hemoglobin: 13.6 g/dL (ref 13.0–17.0)
Immature Granulocytes: 1 %
Lymphocytes Relative: 8 %
Lymphs Abs: 0.6 10*3/uL — ABNORMAL LOW (ref 0.7–4.0)
MCH: 33 pg (ref 26.0–34.0)
MCHC: 32.6 g/dL (ref 30.0–36.0)
MCV: 101.2 fL — ABNORMAL HIGH (ref 80.0–100.0)
Monocytes Absolute: 0.3 10*3/uL (ref 0.1–1.0)
Monocytes Relative: 4 %
Neutro Abs: 6.4 10*3/uL (ref 1.7–7.7)
Neutrophils Relative %: 87 %
Platelets: 215 10*3/uL (ref 150–400)
RBC: 4.12 MIL/uL — ABNORMAL LOW (ref 4.22–5.81)
RDW: 14.8 % (ref 11.5–15.5)
WBC: 7.4 10*3/uL (ref 4.0–10.5)
nRBC: 0 % (ref 0.0–0.2)

## 2019-09-29 LAB — BRAIN NATRIURETIC PEPTIDE: B Natriuretic Peptide: 65.3 pg/mL (ref 0.0–100.0)

## 2019-09-29 LAB — COMPREHENSIVE METABOLIC PANEL
ALT: 23 U/L (ref 0–44)
AST: 50 U/L — ABNORMAL HIGH (ref 15–41)
Albumin: 4.3 g/dL (ref 3.5–5.0)
Alkaline Phosphatase: 80 U/L (ref 38–126)
Anion gap: 22 — ABNORMAL HIGH (ref 5–15)
BUN: 12 mg/dL (ref 8–23)
CO2: 23 mmol/L (ref 22–32)
Calcium: 9 mg/dL (ref 8.9–10.3)
Chloride: 96 mmol/L — ABNORMAL LOW (ref 98–111)
Creatinine, Ser: 0.91 mg/dL (ref 0.61–1.24)
GFR calc Af Amer: >60 mL/min (ref >60)
GFR calc non Af Amer: >60 mL/min (ref >60)
Glucose, Bld: 154 mg/dL — ABNORMAL HIGH (ref 70–99)
Potassium: 3.7 mmol/L (ref 3.5–5.1)
Sodium: 141 mmol/L (ref 135–145)
Total Bilirubin: 0.8 mg/dL (ref 0.3–1.2)
Total Protein: 7.8 g/dL (ref 6.5–8.1)

## 2019-09-29 LAB — TSH: TSH: 0.465 u[IU]/mL (ref 0.350–4.500)

## 2019-09-29 LAB — ETHANOL: Alcohol, Ethyl (B): 97 mg/dL — ABNORMAL HIGH (ref <10)

## 2019-09-29 LAB — MAGNESIUM: Magnesium: 2 mg/dL (ref 1.7–2.4)

## 2019-09-29 LAB — D-DIMER, QUANTITATIVE: D-Dimer, Quant: 10.8 ug/mL-FEU — ABNORMAL HIGH (ref 0.00–0.50)

## 2019-09-29 MED ORDER — LORAZEPAM 2 MG/ML IJ SOLN: 0.0000 mg | Freq: Two times a day (BID) | INTRAMUSCULAR | Status: DC

## 2019-09-29 MED ORDER — THIAMINE HCL 100 MG PO TABS: 100.0000 mg | ORAL_TABLET | Freq: Every day | ORAL | Status: DC

## 2019-09-29 MED ORDER — LORAZEPAM 2 MG/ML IJ SOLN
1.0000 mg | Freq: Once | INTRAMUSCULAR | Status: AC
  Administered 2019-09-29: 1 mg via INTRAVENOUS
  Filled 2019-09-29: qty 1

## 2019-09-29 MED ORDER — LORAZEPAM 2 MG/ML IJ SOLN
0.0000 mg | Freq: Four times a day (QID) | INTRAMUSCULAR | Status: DC
  Administered 2019-09-29: 1 mg via INTRAVENOUS

## 2019-09-29 MED ORDER — LORAZEPAM 1 MG PO TABS: 0.0000 mg | ORAL_TABLET | Freq: Four times a day (QID) | ORAL | Status: DC

## 2019-09-29 MED ORDER — CHLORDIAZEPOXIDE HCL 25 MG PO CAPS: ORAL_CAPSULE | ORAL | 0 refills | Status: AC

## 2019-09-29 MED ORDER — IOHEXOL 350 MG/ML SOLN
75.0000 mL | Freq: Once | INTRAVENOUS | Status: AC | PRN
  Administered 2019-09-29: 75 mL via INTRAVENOUS

## 2019-09-29 MED ORDER — LORAZEPAM 1 MG PO TABS: 0.0000 mg | ORAL_TABLET | Freq: Two times a day (BID) | ORAL | Status: DC

## 2019-09-29 MED ORDER — LACTATED RINGERS IV BOLUS
1000.0000 mL | Freq: Once | INTRAVENOUS | Status: AC
  Administered 2019-09-29: 1000 mL via INTRAVENOUS

## 2019-09-29 MED ORDER — CHLORDIAZEPOXIDE HCL 25 MG PO CAPS
50.0000 mg | ORAL_CAPSULE | Freq: Once | ORAL | Status: AC
  Administered 2019-09-29: 23:00:00 50 mg via ORAL
  Filled 2019-09-29: qty 2

## 2019-09-29 MED ORDER — THIAMINE HCL 100 MG/ML IJ SOLN
100.0000 mg | Freq: Every day | INTRAMUSCULAR | Status: DC
  Administered 2019-09-29: 100 mg via INTRAVENOUS
  Filled 2019-09-29: qty 2

## 2019-09-29 MED ORDER — TOLNAFTATE 1 % EX CREA: 1.0000 "application " | TOPICAL_CREAM | Freq: Two times a day (BID) | CUTANEOUS | 0 refills | Status: AC

## 2019-09-29 NOTE — ED Notes (Signed)
Discharge instructions and Rx reviewed with pt. Pt refusing to go, escorted to bus stop by security. Pt ambulated.

## 2019-09-29 NOTE — ED Provider Notes (Signed)
[facility] EMERGENCY DEPARTMENT Provider Note   CSN: [id number] Arrival date & time: 09/29/19  1807     History Chief Complaint  Patient presents with  . Shortness of Breath  . Alcohol Problem  . Irregular Heart Beat    Larry Byrd is a 64 y.o. male.  The history is provided by the patient and the EMS personnel.   The patient is a 64 year old male with a past medical history of alcohol dependence, hypertension, depression who presents to the ED for shortness of breath and palpitations.  He was brought in by EMS, found to be in atrial fibrillation with RVR, tachycardic in the 160s to 170s, agitated and tremulous, anxious, no chest pain.  The patient denies any history of atrial fibrillation or other arrhythmias, he does not take any rate controlling medications or anticoagulants.  He states he has never had these symptoms before.  He denies ever having alcohol withdrawal complications before.  His symptoms are moderate, symptoms are constant, symptoms are unchanged, nothing makes it better, nothing makes it worse.  No chest pain, fever, chills, nausea, vomiting, diarrhea, cough.    Past Medical History:  Diagnosis Date  . Alcohol dependence (HCC)   . Diverticulosis   . Enlarged prostate   . ETOH abuse    PT drinks daily   . Hypertension   . Major depression, chronic     Patient Active Problem List   Diagnosis Date Noted  . High anion gap metabolic acidosis 07/10/2019  . Oropharyngeal dysphagia 07/10/2019  . Alcohol withdrawal seizure (HCC) 05/14/2019  . Malnutrition of moderate degree 09/13/2017  . Hypertension 09/10/2017  . AKI (acute kidney injury) (HCC) 09/10/2017  . Chest pain 09/10/2017  . Hyponatremia 09/10/2017  . Hypotension 09/10/2017  . Near syncope 09/10/2017  . Neck pain 09/10/2017    History reviewed. No pertinent surgical history.     History reviewed. No pertinent family history.  Social History   Tobacco Use  . Smoking  status: Current Every Day Smoker    Types: Cigarettes  . Smokeless tobacco: Never Used  Substance Use Topics  . Alcohol use: Yes  . Drug use: No    Home Medications Prior to Admission medications   Medication Sig Start Date End Date Taking? Authorizing Provider  chlordiazePOXIDE (LIBRIUM) 25 MG capsule 50mg  PO TID x 1D, then 25-50mg  PO BID X 1D, then 25-50mg  PO QD X 1D 09/29/19   Adrean Findlay, 11/29/19, MD  folic acid (FOLVITE) 1 MG tablet Take 1 tablet (1 mg total) by mouth daily. Patient not taking: Reported on 09/28/2019 07/13/19   07/15/19, MD  naltrexone (DEPADE) 50 MG tablet Take 1 tablet (50 mg total) by mouth daily. Patient not taking: Reported on 09/28/2019 07/12/19   07/14/19, MD  thiamine 100 MG tablet Take 1 tablet (100 mg total) by mouth daily. Patient not taking: Reported on 09/28/2019 07/13/19   07/15/19, MD  tolnaftate (TINACTIN) 1 % cream Apply 1 application topically 2 (two) times daily. 09/29/19   11/29/19, MD  amLODipine (NORVASC) 5 MG tablet Take 1 tablet (5 mg total) by mouth daily. Patient not taking: Reported on 03/29/2019 10/08/17 08/13/19  08/15/19, MD  lisinopril-hydrochlorothiazide (PRINZIDE,ZESTORETIC) 20-12.5 MG tablet Take 1 tablet by mouth daily. Patient not taking: Reported on 03/29/2019 08/11/17 08/13/19  08/15/19, PA-C  potassium chloride SA (K-DUR,KLOR-CON) 20 MEQ tablet Take 2 tablets (40 mEq total) by mouth daily. Patient not taking: Reported on 03/29/2019 08/11/17 08/13/19  Elson Areas, PA-C    Allergies    Patient has no known allergies.  Review of Systems   Review of Systems  Constitutional: Negative for chills and fever.  Respiratory: Positive for shortness of breath. Negative for cough.   Cardiovascular: Positive for palpitations. Negative for chest pain.  Gastrointestinal: Negative for abdominal pain, nausea and vomiting.  Psychiatric/Behavioral: The patient is nervous/anxious.   All other systems reviewed and are  negative.   Physical Exam Updated Vital Signs BP (!) 150/73 (BP Location: Right Arm)   Pulse 89   Temp 98.4 F (36.9 C) (Oral)   Resp 18   Ht 5\' 11"  (1.803 m)   Wt 63.5 kg   SpO2 100%   BMI 19.53 kg/m   Physical Exam Vitals and nursing note reviewed.  Constitutional:      General: He is in acute distress.     Appearance: He is well-developed. He is not toxic-appearing.     Comments: Tremulous  HENT:     Head: Normocephalic and atraumatic.  Eyes:     Conjunctiva/sclera: Conjunctivae normal.  Cardiovascular:     Rate and Rhythm: Tachycardia present. Rhythm irregular.     Pulses: Normal pulses.     Heart sounds: No murmur.  Pulmonary:     Effort: Pulmonary effort is normal. No respiratory distress.     Breath sounds: Normal breath sounds.  Chest:     Chest wall: No tenderness.  Abdominal:     Palpations: Abdomen is soft.     Tenderness: There is no abdominal tenderness.  Musculoskeletal:     Cervical back: Neck supple.     Right lower leg: No tenderness. No edema.     Left lower leg: No tenderness. No edema.     Comments: Tenderness to right ring finger  Skin:    General: Skin is warm and dry.  Neurological:     General: No focal deficit present.     Mental Status: He is alert.  Psychiatric:        Mood and Affect: Mood is anxious.        Behavior: Behavior is agitated.     ED Results / Procedures / Treatments   Labs (all labs ordered are listed, but only abnormal results are displayed) CBC WITH DIFFERENTIAL/PLATELET - Abnormal; Notable for the following components:   RBC 4.12 (*)    MCV 101.2 (*)    Lymphs Abs 0.6 (*)    All other components within normal limits  COMPREHENSIVE METABOLIC PANEL - Abnormal; Notable for the following components:   Chloride 96 (*)    Glucose, Bld 154 (*)    AST 50 (*)    Anion gap 22 (*)    All other components within normal limits  ETHANOL - Abnormal; Notable for the following components:   Alcohol, Ethyl (B) 97 (*)     All other components within normal limits  D-DIMER, QUANTITATIVE (NOT AT [facility]) - Abnormal; Notable for the following components:   D-Dimer, Quant 10.80 (*)    All other components within normal limits  BRAIN NATRIURETIC PEPTIDE  MAGNESIUM  TSH    EKG EKG Interpretation  Date/Time:  Saturday September 29 2019 18:37:09 EST Ventricular Rate:  103 PR Interval:    QRS Duration: 95 QT Interval:  353 QTC Calculation: 463 R Axis:   90 Text Interpretation: Sinus tachycardia Borderline right axis deviation Probable left ventricular hypertrophy Nonspecific T abnrm, anterolateral leads Artifact in lead(s) I II III aVR aVL V1  V2 Confirmed by Blane Ohara [phone]) on 09/29/2019 10:28:05 PM   Radiology CT Head Wo Contrast  Result Date: 09/29/2019 CLINICAL DATA:  Pt with fall. Not sure if head was impacted. EXAM: CT HEAD WITHOUT CONTRAST TECHNIQUE: Contiguous axial images were obtained from the base of the skull through the vertex without intravenous contrast. COMPARISON:  CT head 08/12/2019 FINDINGS: Brain: No evidence of acute infarction, hemorrhage, hydrocephalus, extra-axial collection or mass lesion/mass effect. Chronic volume loss. Diffuse periventricular white matter hypoattenuation consistent with chronic small vessel ischemic change. Vascular: No hyperdense vessel or unexpected calcification. Skull: Normal. Negative for fracture or focal lesion. Sinuses/Orbits: Mucosal retention cyst in the maxillary sinuses. Orbits are unremarkable. The the Other: None. IMPRESSION: No acute intracranial pathology. Electronically Signed   By: Emmaline Kluver M.D.   On: 09/29/2019 19:24   CT Angio Chest PE W and/or Wo Contrast  Result Date: 09/29/2019 CLINICAL DATA:  Positive D-dimer with shortness of breath. EXAM: CT ANGIOGRAPHY CHEST WITH CONTRAST TECHNIQUE: Multidetector CT imaging of the chest was performed using the standard protocol during bolus administration of intravenous contrast. Multiplanar CT image  reconstructions and MIPs were obtained to evaluate the vascular anatomy. CONTRAST:  29mL OMNIPAQUE IOHEXOL 350 MG/ML SOLN COMPARISON:  None. FINDINGS: Cardiovascular: Contrast injection is sufficient to demonstrate satisfactory opacification of the pulmonary arteries to the segmental level. There is no pulmonary embolus. The main pulmonary artery is within normal limits for size. There is no CT evidence of acute right heart strain. Atherosclerotic changes are noted of the thoracic aorta heart size is normal, without pericardial effusion. Mediastinum/Nodes: --No mediastinal or hilar lymphadenopathy. --No axillary lymphadenopathy. --No supraclavicular lymphadenopathy. --Normal thyroid gland. --The esophagus is unremarkable Lungs/Pleura: No pulmonary nodules or masses. No pleural effusion or pneumothorax. No focal airspace consolidation. No focal pleural abnormality. Upper Abdomen: No acute abnormality. Musculoskeletal: No chest wall abnormality. No acute or significant osseous findings. Review of the MIP images confirms the above findings. IMPRESSION: 1. No evidence of pulmonary embolus. 2. No acute findings in the chest. 3. Aortic Atherosclerosis (ICD10-I70.0). Electronically Signed   By: Katherine Mantle M.D.   On: 09/29/2019 22:15   DG Chest Portable 1 View  Result Date: 09/29/2019 CLINICAL DATA:  Shortness of breath. AFib. Alcohol use. EXAM: PORTABLE CHEST 1 VIEW COMPARISON:  Chest radiograph 04/25/2019 FINDINGS: The heart size and mediastinal contours are within normal limits. The lungs are clear. No pneumothorax or significant pleural effusion. Old left lateral rib fractures. IMPRESSION: No acute cardiopulmonary finding. Electronically Signed   By: Emmaline Kluver M.D.   On: 09/29/2019 20:15   DG Knee Left Port  Result Date: 09/30/2019 CLINICAL DATA:  64 year old presenting with RIGHT hand pain and BILATERAL knee pain of unspecified duration. No known injuries. EXAM: LEFT KNEE - 1-2 VIEW COMPARISON:   07/10/2019 and earlier. FINDINGS: No evidence of acute, subacute or healed fractures. Joint spaces well preserved for patient age. Bone mineral density well-preserved. No visible joint effusion. Mild enthesopathic spurring at the insertion of the quadriceps tendon on the patella. IMPRESSION: No acute or significant abnormalities. Electronically Signed   By: Hulan Saas M.D.   On: 09/30/2019 08:49   DG Knee Right Port  Result Date: 09/30/2019 CLINICAL DATA:  64 year old presenting with RIGHT hand pain and BILATERAL knee pain of unspecified duration. No known injuries. EXAM: RIGHT KNEE - 1-2 VIEW COMPARISON:  None. FINDINGS: No evidence of acute, subacute or healed fractures. Well-preserved joint spaces for patient age. Well-preserved bone mineral density. No visible joint effusion.  Enthesopathic spurring at the insertion of the quadriceps tendon and the patellar tendon on the patella. IMPRESSION: No acute or significant abnormality. Electronically Signed   By: Evangeline Dakin M.D.   On: 09/30/2019 08:48   DG Hand Complete Right  Result Date: 09/30/2019 CLINICAL DATA:  64 year old presenting with RIGHT hand pain and BILATERAL knee pain of unspecified duration. No known injuries. EXAM: RIGHT HAND - COMPLETE 3+ VIEW COMPARISON:  Yesterday's examination. FINDINGS: No evidence of acute fracture or dislocation. Joint spaces well preserved for patient age. Well-preserved bone mineral density. No intrinsic osseous abnormalities. IMPRESSION: Normal examination. Electronically Signed   By: Evangeline Dakin M.D.   On: 09/30/2019 08:51   DG Hand Complete Right  Result Date: 09/29/2019 CLINICAL DATA:  64 year old male with fall and trauma to the right hand. EXAM: RIGHT HAND - COMPLETE 3+ VIEW COMPARISON:  None. FINDINGS: There is no acute fracture or dislocation. Mild juxta-articular osteopenia. No significant arthritic changes. The soft tissues are unremarkable. IMPRESSION: Negative. Electronically Signed   By:  Anner Crete M.D.   On: 09/29/2019 20:16    Procedures Procedures (including critical care time)  Medications Ordered in ED Medications  LORazepam (ATIVAN) injection 1 mg (1 mg Intravenous Given 09/29/19 1829)  lactated ringers bolus 1,000 mL (0 mLs Intravenous Stopped 09/29/19 2322)  iohexol (OMNIPAQUE) 350 MG/ML injection 75 mL (75 mLs Intravenous Contrast Given 09/29/19 2136)  chlordiazePOXIDE (LIBRIUM) capsule 50 mg (50 mg Oral Given 09/29/19 2321)    ED Course  I have reviewed the triage vital signs and the nursing notes.  Pertinent labs & imaging results that were available during my care of the patient were reviewed by me and considered in my medical decision making (see chart for details).    MDM Rules/Calculators/A&P                      Here for shortness of breath and palpitations, found to be in A fib with RVR, likely secondary to alcohol withdrawal, symptoms resolved after ativan for withdrawal.  CT head obtained as the patient reports that he fell off a park with his alcohol abuse history he is high risk for intracranial bleed, this was negative for bleed, hand was x-rayed due to right hand pain after a fall  Positive dimer, CTA negative for PE.  COVID-19 is on the differential however the patient is not hypoxic so he is stable for discharge at this time.  He wants to quit drinking alcohol so patient given a dose of Librium and prescription for Librium taper.  Also given resources to follow-up for alcohol abuse as well as instructed to follow-up with a primary care provider.  Stable at this time, strict return precautions provided, discharged home in stable condition.  Discharged home with librium taper.    Final Clinical Impression(s) / ED Diagnoses Final diagnoses:  Alcohol withdrawal syndrome without complication (HCC)  Atrial fibrillation, unspecified type [facility])    Rx / DC Orders ED Discharge Orders         Ordered    chlordiazePOXIDE (LIBRIUM) 25 MG capsule      09/29/19 2239           Paulmichael Schreck, [place], MD 10/01/19 4097    Elnora Morrison, MD 10/02/19 [phone]

## 2019-09-29 NOTE — ED Triage Notes (Signed)
BIB EMS, has no complaints. Just DC 11PM. ETOH on board.

## 2019-09-29 NOTE — ED Triage Notes (Signed)
Pt BIB EMS with reported SHOB , RVR-A-Fib, Ptr drinks Vodka when ever he can . He last drank when he was DC from ED on Friday. Pt arrived to ED room A/O x4 . Pt shaking all over.

## 2019-09-29 NOTE — Discharge Instructions (Signed)
Follow up with your primary doctor this week as well as with the substance abuse resources listed in the attached resource guide.

## 2019-09-29 NOTE — ED Notes (Signed)
Pt transported to CT ?

## 2019-09-29 NOTE — ED Provider Notes (Signed)
[facility] EMERGENCY DEPARTMENT Provider Note  CSN: [id number] Arrival date & time: 09/29/19 0113  Chief Complaint(s) Alcohol Intoxication  HPI Larry Byrd is a 64 y.o. male who returns to the emergency department after being recently discharged for same complaint of bilateral foot pain described as sharp shooting pain.  He reports that is related to that "neurology pain."  Pain worse with ambulation.  No alleviating factors.  He denies any trauma.  No swelling.  Denies any other physical complaints.  HPI  Past Medical History Past Medical History:  Diagnosis Date  . Alcohol dependence ([place])   . Diverticulosis   . Enlarged prostate   . Hypertension   . Major depression, chronic    Patient Active Problem List   Diagnosis Date Noted  . High anion gap metabolic acidosis 93/26/7124  . Oropharyngeal dysphagia 07/10/2019  . Alcohol withdrawal seizure ([place]) 05/14/2019  . Malnutrition of moderate degree 09/13/2017  . Hypertension 09/10/2017  . AKI (acute kidney injury) ([place]) 09/10/2017  . Chest pain 09/10/2017  . Hyponatremia 09/10/2017  . Hypotension 09/10/2017  . Near syncope 09/10/2017  . Neck pain 09/10/2017   Home Medication(s) Prior to Admission medications   Medication Sig Start Date End Date Taking? Authorizing Provider  folic acid (FOLVITE) 1 MG tablet Take 1 tablet (1 mg total) by mouth daily. Patient not taking: Reported on 09/28/2019 07/13/19   Mitzi Hansen, MD  naltrexone (DEPADE) 50 MG tablet Take 1 tablet (50 mg total) by mouth daily. Patient not taking: Reported on 09/28/2019 07/12/19   Mitzi Hansen, MD  thiamine 100 MG tablet Take 1 tablet (100 mg total) by mouth daily. Patient not taking: Reported on 09/28/2019 07/13/19   Mitzi Hansen, MD  tolnaftate (TINACTIN) 1 % cream Apply 1 application topically 2 (two) times daily. 09/29/19   Fatima Blank, MD  amLODipine (NORVASC) 5 MG tablet Take 1 tablet (5 mg total) by mouth  daily. Patient not taking: Reported on 03/29/2019 10/08/17 08/13/19  Nat Christen, MD  lisinopril-hydrochlorothiazide (PRINZIDE,ZESTORETIC) 20-12.5 MG tablet Take 1 tablet by mouth daily. Patient not taking: Reported on 03/29/2019 08/11/17 08/13/19  Fransico Meadow, PA-C  potassium chloride SA (K-DUR,KLOR-CON) 20 MEQ tablet Take 2 tablets (40 mEq total) by mouth daily. Patient not taking: Reported on 03/29/2019 08/11/17 08/13/19  Sidney Ace                                                                                                                                    Past Surgical History No past surgical history on file. Family History No family history on file.  Social History Social History   Tobacco Use  . Smoking status: Current Every Day Smoker    Types: Cigarettes  . Smokeless tobacco: Never Used  Substance Use Topics  . Alcohol use: Yes  . Drug use: No   Allergies Patient has no known allergies.  Review of  Systems Review of Systems All other systems are reviewed and are negative for acute change except as noted in the HPI  Physical Exam Vital Signs  I have reviewed the triage vital signs BP 130/76   Pulse 79   Temp (!) 97.5 F (36.4 C) (Oral)   Resp 16   SpO2 98%   Physical Exam Vitals reviewed.  Constitutional:      General: He is not in acute distress.    Appearance: He is well-developed. He is not diaphoretic.  HENT:     Head: Normocephalic and atraumatic.     Jaw: No trismus.     Right Ear: External ear normal.     Left Ear: External ear normal.     Nose: Nose normal.  Eyes:     General: No scleral icterus.    Conjunctiva/sclera: Conjunctivae normal.  Neck:     Trachea: Phonation normal.  Cardiovascular:     Rate and Rhythm: Normal rate and regular rhythm.     Pulses:          Dorsalis pedis pulses are 2+ on the right side and 2+ on the left side.  Pulmonary:     Effort: Pulmonary effort is normal. No respiratory distress.     Breath sounds:  No stridor.  Abdominal:     General: There is no distension.  Musculoskeletal:        General: Normal range of motion.     Cervical back: Normal range of motion.     Right foot: Normal range of motion. No deformity, bunion, Charcot foot or foot drop.     Left foot: Normal range of motion. No deformity, bunion, Charcot foot or foot drop.  Feet:     Right foot:     Skin integrity: Skin breakdown and dry skin present. No ulcer or blister.     Toenail Condition: Fungal disease present.    Left foot:     Skin integrity: Skin breakdown and dry skin present. No ulcer or blister.     Toenail Condition: Fungal disease present. Neurological:     Mental Status: He is alert and oriented to person, place, and time.  Psychiatric:        Behavior: Behavior normal.     ED Results and Treatments Labs (all labs ordered are listed, but only abnormal results are displayed) Labs Reviewed - No data to display                                                                                                                       EKG  EKG Interpretation  Date/Time:    Ventricular Rate:    PR Interval:    QRS Duration:   QT Interval:    QTC Calculation:   R Axis:     Text Interpretation:        Radiology No results found.  Pertinent labs & imaging results that were available during my care of the patient were reviewed by me and  considered in my medical decision making (see chart for details).  Medications Ordered in ED Medications - No data to display                                                                                                                                  Procedures Procedures  (including critical care time)  Medical Decision Making / ED Course I have reviewed the nursing notes for this encounter and the patient's prior records (if available in EHR or on provided paperwork).   Brodi Kari was evaluated in Emergency Department on 09/29/2019 for the symptoms  described in the history of present illness. He was evaluated in the context of the global COVID-19 pandemic, which necessitated consideration that the patient might be at risk for infection with the SARS-CoV-2 virus that causes COVID-19. Institutional protocols and algorithms that pertain to the evaluation of patients at risk for COVID-19 are in a state of rapid change based on information released by regulatory bodies including the CDC and federal and state organizations. These policies and algorithms were followed during the patient's care in the ED.  Patient with evidence of tinea pedis.  Pulses intact.  No swelling.  No evidence of trauma.  No imaging needed.  Ambulating without complication.      Final Clinical Impression(s) / ED Diagnoses Final diagnoses:  Alcoholic intoxication without complication (HCC)  Tinea pedis of both feet   The patient appears reasonably screened and/or stabilized for discharge and I doubt any other medical condition or other [facility] requiring further screening, evaluation, or treatment in the ED at this time prior to discharge. Safe for discharge with strict return precautions.  Disposition: Discharge  Condition: Good  I have discussed the results, Dx and Tx plan with the patient/family who expressed understanding and agree(s) with the plan. Discharge instructions discussed at length. The patient/family was given strict return precautions who verbalized understanding of the instructions. No further questions at time of discharge.    ED Discharge Orders         Ordered    tolnaftate (TINACTIN) 1 % cream  2 times daily     09/29/19 0128           Follow Up: Primary care provider  Schedule an appointment as soon as possible for a visit        This chart was dictated using voice recognition software.  Despite best efforts to proofread,  errors can occur which can change the documentation meaning.   Nira Conn, MD 09/29/19 [phone]

## 2019-09-29 NOTE — ED Notes (Signed)
Pt is asking to have water.

## 2019-09-30 ENCOUNTER — Emergency Department (HOSPITAL_COMMUNITY)
Admission: EM | Admit: 2019-09-30 | Discharge: 2019-09-30 | Disposition: A | Discharge status: Home / Self Care | Payer: Medicare Other | Attending: Emergency Medicine | Admitting: Emergency Medicine

## 2019-09-30 ENCOUNTER — Other Ambulatory Visit: Payer: Self-pay

## 2019-09-30 ENCOUNTER — Emergency Department (HOSPITAL_COMMUNITY): Payer: Medicare Other

## 2019-09-30 DIAGNOSIS — M25561 Pain in right knee: Secondary | ICD-10-CM

## 2019-09-30 DIAGNOSIS — R52 Pain, unspecified: Secondary | ICD-10-CM

## 2019-09-30 DIAGNOSIS — S80211A Abrasion, right knee, initial encounter: Secondary | ICD-10-CM | POA: Insufficient documentation

## 2019-09-30 DIAGNOSIS — F17210 Nicotine dependence, cigarettes, uncomplicated: Secondary | ICD-10-CM | POA: Diagnosis not present

## 2019-09-30 DIAGNOSIS — S80212A Abrasion, left knee, initial encounter: Secondary | ICD-10-CM | POA: Diagnosis not present

## 2019-09-30 DIAGNOSIS — W1830XA Fall on same level, unspecified, initial encounter: Secondary | ICD-10-CM | POA: Diagnosis not present

## 2019-09-30 DIAGNOSIS — Y939 Activity, unspecified: Secondary | ICD-10-CM | POA: Insufficient documentation

## 2019-09-30 DIAGNOSIS — I10 Essential (primary) hypertension: Secondary | ICD-10-CM | POA: Diagnosis not present

## 2019-09-30 DIAGNOSIS — M79641 Pain in right hand: Secondary | ICD-10-CM | POA: Insufficient documentation

## 2019-09-30 DIAGNOSIS — Y929 Unspecified place or not applicable: Secondary | ICD-10-CM | POA: Diagnosis not present

## 2019-09-30 DIAGNOSIS — Y999 Unspecified external cause status: Secondary | ICD-10-CM | POA: Diagnosis not present

## 2019-09-30 DIAGNOSIS — M79644 Pain in right finger(s): Secondary | ICD-10-CM

## 2019-09-30 DIAGNOSIS — T07XXXA Unspecified multiple injuries, initial encounter: Secondary | ICD-10-CM | POA: Diagnosis present

## 2019-09-30 LAB — RESPIRATORY PANEL BY RT PCR (FLU A&B, COVID)
Influenza A by PCR: NEGATIVE
Influenza B by PCR: NEGATIVE
SARS Coronavirus 2 by RT PCR: POSITIVE — AB

## 2019-09-30 MED ORDER — ACETAMINOPHEN 325 MG PO TABS
650.0000 mg | ORAL_TABLET | Freq: Once | ORAL | Status: AC
  Administered 2019-09-30: 650 mg via ORAL
  Filled 2019-09-30: qty 2

## 2019-09-30 NOTE — ED Triage Notes (Addendum)
Per pt was here earlier too cold to stay outside. No having knee pain. Both knees

## 2019-09-30 NOTE — Discharge Instructions (Addendum)
Keep finger taped together as discussed.  Follow-up with orthopedics and primary care doctor.

## 2019-09-30 NOTE — ED Notes (Signed)
Pt verbalizes understanding of d/c instructions. Pt ambulatory at d/c with all belongings. Pt given bus pass.

## 2019-09-30 NOTE — ED Notes (Signed)
Lab called with critical result, pt is COVID + from his test that was obtained during yesterdays admission. Pt placed in waiting room 2 in the lobby awaiting a room.

## 2019-09-30 NOTE — ED Provider Notes (Signed)
[facility] EMERGENCY DEPARTMENT Provider Note   CSN: [id number] Arrival date & time: 09/30/19  0255     History Chief Complaint  Patient presents with  . Knee Pain    homeless    Larry Byrd is a 64 y.o. male.  Patient here for bilateral knee pain, right pinky pain after a fall.  Patient denies any loss of consciousness.  Did not hit his head.  Fall happened 2 days ago.  History of alcohol abuse.  Has not taken anything for the pain.  The history is provided by the patient.  Knee Pain Location:  Knee Time since incident:  2 days Injury: yes   Knee location:  L knee and R knee Pain details:    Quality:  Aching   Radiates to:  Does not radiate   Severity:  Mild   Onset quality:  Gradual   Duration:  2 days   Timing:  Intermittent   Progression:  Waxing and waning Relieved by:  Nothing Worsened by:  Bearing weight Associated symptoms: swelling   Associated symptoms: no back pain, no decreased ROM, no fatigue, no fever, no itching, no muscle weakness, no neck pain, no numbness, no stiffness and no tingling        Past Medical History:  Diagnosis Date  . Alcohol dependence ([place])   . Diverticulosis   . Enlarged prostate   . ETOH abuse    PT drinks daily   . Hypertension   . Major depression, chronic     Patient Active Problem List   Diagnosis Date Noted  . High anion gap metabolic acidosis 84/13/2440  . Oropharyngeal dysphagia 07/10/2019  . Alcohol withdrawal seizure ([place]) 05/14/2019  . Malnutrition of moderate degree 09/13/2017  . Hypertension 09/10/2017  . AKI (acute kidney injury) ([place]) 09/10/2017  . Chest pain 09/10/2017  . Hyponatremia 09/10/2017  . Hypotension 09/10/2017  . Near syncope 09/10/2017  . Neck pain 09/10/2017    No past surgical history on file.     No family history on file.  Social History   Tobacco Use  . Smoking status: Current Every Day Smoker    Types: Cigarettes  . Smokeless tobacco: Never Used    Substance Use Topics  . Alcohol use: Yes  . Drug use: No    Home Medications Prior to Admission medications   Medication Sig Start Date End Date Taking? Authorizing Provider  chlordiazePOXIDE (LIBRIUM) 25 MG capsule 50mg  PO TID x 1D, then 25-50mg  PO BID X 1D, then 25-50mg  PO QD X 1D 09/29/19   Hunter, [place], MD  folic acid (FOLVITE) 1 MG tablet Take 1 tablet (1 mg total) by mouth daily. Patient not taking: Reported on 09/28/2019 07/13/19   Mitzi Hansen, MD  naltrexone (DEPADE) 50 MG tablet Take 1 tablet (50 mg total) by mouth daily. Patient not taking: Reported on 09/28/2019 07/12/19   Mitzi Hansen, MD  thiamine 100 MG tablet Take 1 tablet (100 mg total) by mouth daily. Patient not taking: Reported on 09/28/2019 07/13/19   Mitzi Hansen, MD  tolnaftate (TINACTIN) 1 % cream Apply 1 application topically 2 (two) times daily. 09/29/19   Fatima Blank, MD  amLODipine (NORVASC) 5 MG tablet Take 1 tablet (5 mg total) by mouth daily. Patient not taking: Reported on 03/29/2019 10/08/17 08/13/19  Nat Christen, MD  lisinopril-hydrochlorothiazide (PRINZIDE,ZESTORETIC) 20-12.5 MG tablet Take 1 tablet by mouth daily. Patient not taking: Reported on 03/29/2019 08/11/17 08/13/19  Fransico Meadow, PA-C  potassium chloride  SA (K-DUR,KLOR-CON) 20 MEQ tablet Take 2 tablets (40 mEq total) by mouth daily. Patient not taking: Reported on 03/29/2019 08/11/17 08/13/19  Elson Areas, PA-C    Allergies    Patient has no known allergies.  Review of Systems   Review of Systems  Constitutional: Negative for chills, fatigue and fever.  HENT: Negative for ear pain and sore throat.   Eyes: Negative for pain and visual disturbance.  Respiratory: Negative for cough and shortness of breath.   Cardiovascular: Negative for chest pain and palpitations.  Gastrointestinal: Negative for abdominal pain and vomiting.  Genitourinary: Negative for dysuria and hematuria.  Musculoskeletal: Positive for arthralgias. Negative  for back pain, neck pain and stiffness.  Skin: Negative for color change, itching and rash.  Neurological: Negative for seizures and syncope.  All other systems reviewed and are negative.   Physical Exam Updated Vital Signs BP (!) 171/86 (BP Location: Right Arm)   Pulse 91   Temp 97.6 F (36.4 C) (Oral)   Resp 18   Ht 5\' 11"  (1.803 m)   Wt 63.5 kg   SpO2 100%   BMI 19.52 kg/m   Physical Exam Vitals and nursing note reviewed.  Constitutional:      Appearance: He is well-developed.  HENT:     Head: Normocephalic and atraumatic.     Nose: Nose normal.  Eyes:     Extraocular Movements: Extraocular movements intact.     Conjunctiva/sclera: Conjunctivae normal.     Pupils: Pupils are equal, round, and reactive to light.  Cardiovascular:     Rate and Rhythm: Normal rate and regular rhythm.     Heart sounds: No murmur.  Pulmonary:     Effort: Pulmonary effort is normal. No respiratory distress.     Breath sounds: Normal breath sounds.  Abdominal:     Palpations: Abdomen is soft.     Tenderness: There is no abdominal tenderness.  Musculoskeletal:        General: Tenderness present.     Cervical back: Normal range of motion and neck supple.     Comments: Tenderness to bilateral knees with no significant swelling, tenderness to right pinky with some mild swelling, finger held slightly in flexion, unable to fully extend right pinky  Skin:    General: Skin is warm and dry.     Capillary Refill: Capillary refill takes less than 2 seconds.     Comments: Scabbed over abrasions to both knees  Neurological:     General: No focal deficit present.     Mental Status: He is alert.     Cranial Nerves: No cranial nerve deficit.     Sensory: No sensory deficit.     Motor: No weakness.     Coordination: Coordination normal.     Gait: Gait normal.     ED Results / Procedures / Treatments   Labs (all labs ordered are listed, but only abnormal results are displayed) Labs Reviewed - No  data to display  EKG None  Radiology CT Head Wo Contrast  Result Date: 09/29/2019 CLINICAL DATA:  Pt with fall. Not sure if head was impacted. EXAM: CT HEAD WITHOUT CONTRAST TECHNIQUE: Contiguous axial images were obtained from the base of the skull through the vertex without intravenous contrast. COMPARISON:  CT head 08/12/2019 FINDINGS: Brain: No evidence of acute infarction, hemorrhage, hydrocephalus, extra-axial collection or mass lesion/mass effect. Chronic volume loss. Diffuse periventricular white matter hypoattenuation consistent with chronic small vessel ischemic change. Vascular: No hyperdense vessel or  unexpected calcification. Skull: Normal. Negative for fracture or focal lesion. Sinuses/Orbits: Mucosal retention cyst in the maxillary sinuses. Orbits are unremarkable. The the Other: None. IMPRESSION: No acute intracranial pathology. Electronically Signed   By: Emmaline Kluver M.D.   On: 09/29/2019 19:24   CT Angio Chest PE W and/or Wo Contrast  Result Date: 09/29/2019 CLINICAL DATA:  Positive D-dimer with shortness of breath. EXAM: CT ANGIOGRAPHY CHEST WITH CONTRAST TECHNIQUE: Multidetector CT imaging of the chest was performed using the standard protocol during bolus administration of intravenous contrast. Multiplanar CT image reconstructions and MIPs were obtained to evaluate the vascular anatomy. CONTRAST:  60mL OMNIPAQUE IOHEXOL 350 MG/ML SOLN COMPARISON:  None. FINDINGS: Cardiovascular: Contrast injection is sufficient to demonstrate satisfactory opacification of the pulmonary arteries to the segmental level. There is no pulmonary embolus. The main pulmonary artery is within normal limits for size. There is no CT evidence of acute right heart strain. Atherosclerotic changes are noted of the thoracic aorta heart size is normal, without pericardial effusion. Mediastinum/Nodes: --No mediastinal or hilar lymphadenopathy. --No axillary lymphadenopathy. --No supraclavicular lymphadenopathy.  --Normal thyroid gland. --The esophagus is unremarkable Lungs/Pleura: No pulmonary nodules or masses. No pleural effusion or pneumothorax. No focal airspace consolidation. No focal pleural abnormality. Upper Abdomen: No acute abnormality. Musculoskeletal: No chest wall abnormality. No acute or significant osseous findings. Review of the MIP images confirms the above findings. IMPRESSION: 1. No evidence of pulmonary embolus. 2. No acute findings in the chest. 3. Aortic Atherosclerosis (ICD10-I70.0). Electronically Signed   By: Katherine Mantle M.D.   On: 09/29/2019 22:15   DG Chest Portable 1 View  Result Date: 09/29/2019 CLINICAL DATA:  Shortness of breath. AFib. Alcohol use. EXAM: PORTABLE CHEST 1 VIEW COMPARISON:  Chest radiograph 04/25/2019 FINDINGS: The heart size and mediastinal contours are within normal limits. The lungs are clear. No pneumothorax or significant pleural effusion. Old left lateral rib fractures. IMPRESSION: No acute cardiopulmonary finding. Electronically Signed   By: Emmaline Kluver M.D.   On: 09/29/2019 20:15   DG Knee Left Port  Result Date: 09/30/2019 CLINICAL DATA:  63 year old presenting with RIGHT hand pain and BILATERAL knee pain of unspecified duration. No known injuries. EXAM: LEFT KNEE - 1-2 VIEW COMPARISON:  07/10/2019 and earlier. FINDINGS: No evidence of acute, subacute or healed fractures. Joint spaces well preserved for patient age. Bone mineral density well-preserved. No visible joint effusion. Mild enthesopathic spurring at the insertion of the quadriceps tendon on the patella. IMPRESSION: No acute or significant abnormalities. Electronically Signed   By: Hulan Saas M.D.   On: 09/30/2019 08:49   DG Knee Right Port  Result Date: 09/30/2019 CLINICAL DATA:  64 year old presenting with RIGHT hand pain and BILATERAL knee pain of unspecified duration. No known injuries. EXAM: RIGHT KNEE - 1-2 VIEW COMPARISON:  None. FINDINGS: No evidence of acute, subacute or  healed fractures. Well-preserved joint spaces for patient age. Well-preserved bone mineral density. No visible joint effusion. Enthesopathic spurring at the insertion of the quadriceps tendon and the patellar tendon on the patella. IMPRESSION: No acute or significant abnormality. Electronically Signed   By: Hulan Saas M.D.   On: 09/30/2019 08:48   DG Hand Complete Right  Result Date: 09/30/2019 CLINICAL DATA:  64 year old presenting with RIGHT hand pain and BILATERAL knee pain of unspecified duration. No known injuries. EXAM: RIGHT HAND - COMPLETE 3+ VIEW COMPARISON:  Yesterday's examination. FINDINGS: No evidence of acute fracture or dislocation. Joint spaces well preserved for patient age. Well-preserved bone mineral density. No  intrinsic osseous abnormalities. IMPRESSION: Normal examination. Electronically Signed   By: Hulan Saas M.D.   On: 09/30/2019 08:51   DG Hand Complete Right  Result Date: 09/29/2019 CLINICAL DATA:  64 year old male with fall and trauma to the right hand. EXAM: RIGHT HAND - COMPLETE 3+ VIEW COMPARISON:  None. FINDINGS: There is no acute fracture or dislocation. Mild juxta-articular osteopenia. No significant arthritic changes. The soft tissues are unremarkable. IMPRESSION: Negative. Electronically Signed   By: Elgie Collard M.D.   On: 09/29/2019 20:16    Procedures Procedures (including critical care time)  Medications Ordered in ED Medications  acetaminophen (TYLENOL) tablet 650 mg (650 mg Oral Given 09/30/19 0809)    ED Course  I have reviewed the triage vital signs and the nursing notes.  Pertinent labs & imaging results that were available during my care of the patient were reviewed by me and considered in my medical decision making (see chart for details).    MDM Rules/Calculators/A&P                      Larry Byrd is a 64 year old male with history of alcohol abuse who presents to the ED with bilateral knee pain, right hand pain.  Patient  with bilateral knee pain, right pinky pain after fall 2 days ago.  Has been ambulatory.  Right pinky finger appears slightly held in flexion.  Concern for possible minor ligament injury.  X-ray showed no acute fracture.  Finger was buddy taped.  Recommend that he continue to keep fingers taped and given information to follow-up with hand.  No signs of infection to the hand.  Patient with negative x-rays of knees.  Felt better after Tylenol.  Had normal neurological exam otherwise.  No concern for intracranial injury.  Discharged in good condition.  Understands return precautions.  This chart was dictated using voice recognition software.  Despite best efforts to proofread,  errors can occur which can change the documentation meaning.   Final Clinical Impression(s) / ED Diagnoses Final diagnoses:  Acute pain of both knees  Finger pain, right    Rx / DC Orders ED Discharge Orders    None       Virgina Norfolk, DO 09/30/19 [phone]

## 2019-10-08 ENCOUNTER — Encounter: Payer: Self-pay | Admitting: *Deleted

## 2019-10-08 ENCOUNTER — Telehealth: Payer: Self-pay | Admitting: *Deleted

## 2019-10-08 NOTE — Congregational Nurse Program (Signed)
  Dept: [phone]   Congregational Nurse Program Note  Date of Encounter: 10/08/2019  Past Medical History: Past Medical History:  Diagnosis Date  . Alcohol dependence ([facility])   . Diverticulosis   . Enlarged prostate   . ETOH abuse    PT drinks daily   . Hypertension   . Major depression, chronic     Encounter Details: CNP Questionnaire - 10/08/19 1432      Questionnaire   Patient Status  Not Applicable    Race  Black or African American    Location Patient Served At  [employer];Medicare    Uninsured  Not Applicable    Food  No food insecurities    Housing/Utilities  No permanent housing    Transportation  No transportation needs    Interpersonal Safety  No, do not feel physically and emotionally safe where you currently live    Medication  No medication insecurities    Medical Provider  Yes    Referrals  Not Applicable    ED Visit Averted  Not Applicable    Life-Saving Intervention Made  Not Applicable      Pt came to [facility] to use facility and asked for blood pressure check. Vitals 126/70. Pt reports that he has a PCP and is not currently taking blood pressure medication. Pt reports that he has taken medication in the past and had problems with low blood pressure. Pt has talked with [facility] on housing. Gave work schedule to client and offered to keep check on blood pressure as needed. Waynetta Sandy RN [phone]

## 2019-10-08 NOTE — Telephone Encounter (Signed)
Separate Housing for [employer]//weekly pay

## 2019-10-09 ENCOUNTER — Emergency Department (HOSPITAL_COMMUNITY)
Admission: EM | Admit: 2019-10-09 | Discharge: 2019-10-09 | Disposition: A | Discharge status: Home / Self Care | Payer: Medicare Other | Attending: Emergency Medicine | Admitting: Emergency Medicine

## 2019-10-09 ENCOUNTER — Other Ambulatory Visit: Payer: Self-pay

## 2019-10-09 ENCOUNTER — Emergency Department (HOSPITAL_COMMUNITY): Payer: Medicare Other

## 2019-10-09 ENCOUNTER — Emergency Department (HOSPITAL_COMMUNITY)
Admission: EM | Admit: 2019-10-09 | Discharge: 2019-10-10 | Discharge status: Against Medical Advice | Payer: Medicare Other | Source: Home / Self Care

## 2019-10-09 ENCOUNTER — Encounter (HOSPITAL_COMMUNITY): Payer: Self-pay | Admitting: Emergency Medicine

## 2019-10-09 DIAGNOSIS — I10 Essential (primary) hypertension: Secondary | ICD-10-CM | POA: Diagnosis not present

## 2019-10-09 DIAGNOSIS — Y908 Blood alcohol level of 240 mg/100 ml or more: Secondary | ICD-10-CM | POA: Diagnosis not present

## 2019-10-09 DIAGNOSIS — Z79899 Other long term (current) drug therapy: Secondary | ICD-10-CM | POA: Diagnosis not present

## 2019-10-09 DIAGNOSIS — F10920 Alcohol use, unspecified with intoxication, uncomplicated: Secondary | ICD-10-CM | POA: Insufficient documentation

## 2019-10-09 DIAGNOSIS — F17210 Nicotine dependence, cigarettes, uncomplicated: Secondary | ICD-10-CM | POA: Diagnosis not present

## 2019-10-09 DIAGNOSIS — R0789 Other chest pain: Secondary | ICD-10-CM | POA: Insufficient documentation

## 2019-10-09 LAB — CBC
HCT: 44.1 % (ref 39.0–52.0)
Hemoglobin: 14.4 g/dL (ref 13.0–17.0)
MCH: 33.8 pg (ref 26.0–34.0)
MCHC: 32.7 g/dL (ref 30.0–36.0)
MCV: 103.5 fL — ABNORMAL HIGH (ref 80.0–100.0)
Platelets: 206 10*3/uL (ref 150–400)
RBC: 4.26 MIL/uL (ref 4.22–5.81)
RDW: 14.8 % (ref 11.5–15.5)
WBC: 3.4 10*3/uL — ABNORMAL LOW (ref 4.0–10.5)
nRBC: 0 % (ref 0.0–0.2)

## 2019-10-09 LAB — COMPREHENSIVE METABOLIC PANEL
ALT: 21 U/L (ref 0–44)
AST: 49 U/L — ABNORMAL HIGH (ref 15–41)
Albumin: 4 g/dL (ref 3.5–5.0)
Alkaline Phosphatase: 66 U/L (ref 38–126)
Anion gap: 16 — ABNORMAL HIGH (ref 5–15)
BUN: 11 mg/dL (ref 8–23)
CO2: 22 mmol/L (ref 22–32)
Calcium: 8.5 mg/dL — ABNORMAL LOW (ref 8.9–10.3)
Chloride: 104 mmol/L (ref 98–111)
Creatinine, Ser: 0.76 mg/dL (ref 0.61–1.24)
GFR calc Af Amer: >60 mL/min (ref >60)
GFR calc non Af Amer: >60 mL/min (ref >60)
Glucose, Bld: 82 mg/dL (ref 70–99)
Potassium: 4 mmol/L (ref 3.5–5.1)
Sodium: 142 mmol/L (ref 135–145)
Total Bilirubin: 0.7 mg/dL (ref 0.3–1.2)
Total Protein: 7.8 g/dL (ref 6.5–8.1)

## 2019-10-09 LAB — TROPONIN I (HIGH SENSITIVITY)
Troponin I (High Sensitivity): 6 ng/L (ref <18)
Troponin I (High Sensitivity): 11 ng/L (ref <18)

## 2019-10-09 LAB — CBG MONITORING, ED: Glucose-Capillary: 97 mg/dL (ref 70–99)

## 2019-10-09 LAB — ETHANOL: Alcohol, Ethyl (B): 357 mg/dL (ref <10)

## 2019-10-09 MED ORDER — ASPIRIN 81 MG PO CHEW
324.0000 mg | CHEWABLE_TABLET | Freq: Once | ORAL | Status: AC
  Administered 2019-10-09: 324 mg via ORAL
  Filled 2019-10-09: qty 4

## 2019-10-09 MED ORDER — SODIUM CHLORIDE 0.9% FLUSH: 3.0000 mL | Freq: Once | INTRAVENOUS | Status: DC

## 2019-10-09 NOTE — ED Triage Notes (Addendum)
Pt arrives to ED from the bus stop with complaints of sudden left sided chest pain. Patient is currently homeless and has been living on the streets for awhile now. Patient states he is currently drunk and has been drinking everyday. Patient states it is too cold to be outside. Patient COVID positive 10 days ago.

## 2019-10-09 NOTE — Discharge Instructions (Addendum)
Drink alcohol only in moderation.  Please be sure to follow-up with your physician.  Return here for concerning changes in your condition.

## 2019-10-09 NOTE — ED Notes (Signed)
Pt was discharged from the ED. Pt read and understood discharge paperwork. Pt had vital signs completed. Pt conscious, breathing, and A&Ox4. No distress noted. Pt speaking in complete sentences. Pt brought out of the ED via wheelchair. E-signature not available. 

## 2019-10-09 NOTE — ED Notes (Signed)
CBG Results of 97 reported to Selena Batten, Charity fundraiser.

## 2019-10-09 NOTE — ED Notes (Signed)
Patient was given a bus voucher.

## 2019-10-09 NOTE — ED Provider Notes (Signed)
[facility] EMERGENCY DEPARTMENT Provider Note   CSN: [id number] Arrival date & time: 10/09/19  1303     History Chief Complaint  Patient presents with  . Chest Pain  . Alcohol Intoxication    Larry Byrd is a 64 y.o. male.  HPI   Patient presents via EMS due to concern of possible chest pain, possible intoxication.  The patient is awake and alert, stating that he has no current pain during my initial evaluation, though he acknowledges he may have had some previously.  He does acknowledge drinking alcohol, but states that he is not drunk.  He denies any other pain or concerns or discomfort. EMS reports the patient was at the bus stop when he suddenly complained of left-sided chest pain.  He also reportedly told someone that is too cold to be outside today.  Past Medical History:  Diagnosis Date  . Alcohol dependence ([facility])   . Diverticulosis   . Enlarged prostate   . ETOH abuse    PT drinks daily   . Hypertension   . Major depression, chronic     Patient Active Problem List   Diagnosis Date Noted  . High anion gap metabolic acidosis 07/10/2019  . Oropharyngeal dysphagia 07/10/2019  . Alcohol withdrawal seizure ([facility]) 05/14/2019  . Malnutrition of moderate degree 09/13/2017  . Hypertension 09/10/2017  . AKI (acute kidney injury) ([facility]) 09/10/2017  . Chest pain 09/10/2017  . Hyponatremia 09/10/2017  . Hypotension 09/10/2017  . Near syncope 09/10/2017  . Neck pain 09/10/2017    History reviewed. No pertinent surgical history.     History reviewed. No pertinent family history.  Social History   Tobacco Use  . Smoking status: Current Every Day Smoker    Types: Cigarettes  . Smokeless tobacco: Never Used  Substance Use Topics  . Alcohol use: Yes  . Drug use: No    Home Medications Prior to Admission medications   Medication Sig Start Date End Date Taking? Authorizing Provider  chlordiazePOXIDE (LIBRIUM) 25 MG capsule 50mg  PO TID x 1D,  then 25-50mg  PO BID X 1D, then 25-50mg  PO QD X 1D 09/29/19   Hunter, 11/29/19, MD  folic acid (FOLVITE) 1 MG tablet Take 1 tablet (1 mg total) by mouth daily. Patient not taking: Reported on 09/28/2019 07/13/19   07/15/19, MD  naltrexone (DEPADE) 50 MG tablet Take 1 tablet (50 mg total) by mouth daily. Patient not taking: Reported on 09/28/2019 07/12/19   07/14/19, MD  thiamine 100 MG tablet Take 1 tablet (100 mg total) by mouth daily. Patient not taking: Reported on 09/28/2019 07/13/19   07/15/19, MD  tolnaftate (TINACTIN) 1 % cream Apply 1 application topically 2 (two) times daily. 09/29/19   11/29/19, MD  amLODipine (NORVASC) 5 MG tablet Take 1 tablet (5 mg total) by mouth daily. Patient not taking: Reported on 03/29/2019 10/08/17 08/13/19  08/15/19, MD  lisinopril-hydrochlorothiazide (PRINZIDE,ZESTORETIC) 20-12.5 MG tablet Take 1 tablet by mouth daily. Patient not taking: Reported on 03/29/2019 08/11/17 08/13/19  08/15/19, PA-C  potassium chloride SA (K-DUR,KLOR-CON) 20 MEQ tablet Take 2 tablets (40 mEq total) by mouth daily. Patient not taking: Reported on 03/29/2019 08/11/17 08/13/19  08/15/19, PA-C    Allergies    Patient has no known allergies.  Review of Systems   Review of Systems  Unable to perform ROS: Other  Clinically intoxicated  Physical Exam Updated Vital Signs BP (!) 142/99 (BP Location: Left Arm)  Pulse 86   Temp 97.8 F (36.6 C) (Oral)   Resp 16   Ht 5\' 11"  (1.803 m)   Wt 63.5 kg   SpO2 100%   BMI 19.52 kg/m   Physical Exam Vitals and nursing note reviewed.  Constitutional:      General: He is not in acute distress.    Appearance: He is well-developed.  HENT:     Head: Normocephalic and atraumatic.  Eyes:     Conjunctiva/sclera: Conjunctivae normal.  Cardiovascular:     Rate and Rhythm: Normal rate and regular rhythm.  Pulmonary:     Effort: Pulmonary effort is normal. No respiratory distress.     Breath sounds: No  stridor.  Abdominal:     General: There is no distension.  Skin:    General: Skin is warm and dry.  Neurological:     Mental Status: He is alert and oriented to person, place, and time.     Comments: Intoxicated, but speaking clearly, moving all extremities spontaneously and appropriately to command.  He is however, struggling with the coordination required to put a adult diaper on himself.  Psychiatric:     Comments: Clinically inebriated, but pleasantly interactive.     ED Results / Procedures / Treatments   Labs (all labs ordered are listed, but only abnormal results are displayed) Labs Reviewed  CBC - Abnormal; Notable for the following components:      Result Value   WBC 3.4 (*)    MCV 103.5 (*)    All other components within normal limits  COMPREHENSIVE METABOLIC PANEL - Abnormal; Notable for the following components:   Calcium 8.5 (*)    AST 49 (*)    Anion gap 16 (*)    All other components within normal limits  ETHANOL - Abnormal; Notable for the following components:   Alcohol, Ethyl (B) 357 (*)    All other components within normal limits  CBG MONITORING, ED  TROPONIN I (HIGH SENSITIVITY)  TROPONIN I (HIGH SENSITIVITY)   EMS rhythm strip reviewed, similar to EKG here sinus rhythm, rate 90, substantial artifact EKG EKG Interpretation  Date/Time:  Tuesday October 09 2019 13:05:37 EDT Ventricular Rate:  83 PR Interval:  128 QRS Duration: 82 QT Interval:  380 QTC Calculation: 446 R Axis:   75 Text Interpretation: Normal sinus rhythm Artifact Otherwise within normal limits Confirmed by Carmin Muskrat [phone]) on 10/09/2019 1:23:24 PM Also confirmed by Carmin Muskrat ([id number]), editor Hattie Perch [phone])  on 10/09/2019 1:55:37 PM   Radiology DG Chest Portable 1 View  Result Date: 10/09/2019 CLINICAL DATA:  Chest pain EXAM: PORTABLE CHEST 1 VIEW COMPARISON:  09/29/2019 FINDINGS: The heart size and mediastinal contours are within normal limits. Both lungs are  clear. The visualized skeletal structures are unremarkable. IMPRESSION: No acute abnormality of the lungs in AP portable projection. Electronically Signed   By: Eddie Candle M.D.   On: 10/09/2019 13:52    Procedures Procedures (including critical care time)  Medications Ordered in ED Medications  sodium chloride flush (NS) 0.9 % injection 3 mL (3 mLs Intravenous Not Given 10/09/19 1330)  aspirin chewable tablet 324 mg (324 mg Oral Given 10/09/19 1341)    ED Course  I have reviewed the triage vital signs and the nursing notes.  Pertinent labs & imaging results that were available during my care of the patient were reviewed by me and considered in my medical decision making (see chart for details).  This adult male with history of  alcohol abuse presents after describing chest pain, and complaining of it being cold outside.  Here he is awake and alert, interactive, in no distress, no hypoxia, no increased work of breathing, no ongoing pain.  Patient received aspirin, was placed on continuous cardiac monitoring, had unremarkable initial lab studies.  3:25 PM Patient sleeping, awakens easily.  Findings reassuring, nonischemic EKG, initial troponin normal.  However, the patient is found to have alcohol level of greater than 350. Patient will require time to achieve sobriety, but absent distress, abnormal early findings, should his 0 troponins be unremarkable, and he remained otherwise well-appearing he may be appropriate for discharge with outpatient follow-up.  Dr. Pryor Ochoa is aware of the patient. Final Clinical Impression(s) / ED Diagnoses Final diagnoses:  Atypical chest pain  Alcoholic intoxication without complication [facility])     Gerhard Munch, MD 10/09/19 1526

## 2019-10-10 ENCOUNTER — Emergency Department (HOSPITAL_COMMUNITY): Payer: Medicare Other

## 2019-10-10 ENCOUNTER — Emergency Department (HOSPITAL_COMMUNITY)
Admission: EM | Admit: 2019-10-10 | Discharge: 2019-10-10 | Disposition: A | Discharge status: Home / Self Care | Payer: Medicare Other | Attending: Emergency Medicine | Admitting: Emergency Medicine

## 2019-10-10 ENCOUNTER — Other Ambulatory Visit: Payer: Self-pay

## 2019-10-10 ENCOUNTER — Encounter (HOSPITAL_COMMUNITY): Payer: Self-pay | Admitting: Emergency Medicine

## 2019-10-10 DIAGNOSIS — F10229 Alcohol dependence with intoxication, unspecified: Secondary | ICD-10-CM | POA: Insufficient documentation

## 2019-10-10 DIAGNOSIS — R0789 Other chest pain: Secondary | ICD-10-CM | POA: Insufficient documentation

## 2019-10-10 DIAGNOSIS — I10 Essential (primary) hypertension: Secondary | ICD-10-CM | POA: Insufficient documentation

## 2019-10-10 DIAGNOSIS — F10221 Alcohol dependence with intoxication delirium: Secondary | ICD-10-CM

## 2019-10-10 DIAGNOSIS — F17210 Nicotine dependence, cigarettes, uncomplicated: Secondary | ICD-10-CM | POA: Insufficient documentation

## 2019-10-10 LAB — TROPONIN I (HIGH SENSITIVITY): Troponin I (High Sensitivity): 8 ng/L (ref <18)

## 2019-10-10 MED ORDER — SODIUM CHLORIDE 0.9% FLUSH: 3.0000 mL | Freq: Once | INTRAVENOUS | Status: DC

## 2019-10-10 NOTE — ED Notes (Signed)
Patient ambulated independently to restroom.

## 2019-10-10 NOTE — ED Notes (Signed)
Pt awake, alert and provided food tray.
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
## 2019-10-10 NOTE — Discharge Instructions (Addendum)
Substance Abuse Treatment Programs ° °Intensive Outpatient Programs °[facility]     °[address]      °[place], [place]                   °[phone]      ° °[facility] °[address] °[place], [place] °[phone] ° °[facility]     °(Inpatient and outpatient)     °[address]           °[phone]   ° °[facility] °[phone] (Suboxone and Methadone) ° °[address]      °[place], [place] [address]      °[phone]      ° °[address] °[place], [place] °[phone] ° °[facility] (Outpatient/Inpatient, [facility])    °(insurance only) [phone]      °       °[facility] (Groups & Residential) °[place], [place] °[phone] ° °   °[facility]     °[address]     °[place], [place]      °[phone]      ° °[facility] (for caregivers and family) °[address] °[place], [place] °336-299-4655 ° ° ° ° ° °Residential Treatment Programs °[facility]      °[address], [place], [place] [address]  °[phone]      ° °[facility] °[address] [place], [place] [address] °[phone] ° °[facility]        °336-248-8914      ° °[facility] °[phone] ° °[facility]             °[address]                                         °[place], [place]                                                °877-615-2722 or 336-784-9470                              ° °[facility] [place] °[address] °[place], [address] °[phone] ° °[facility]    °[address]      °[place], [place]     °[phone]      ° °[facility] °[address] °[place], [place] °336-285-9073 ° °[facility]   °[address]     °[place], [place] [address]     °[phone]      °Admissions: 8am-3pm M-F ° °[facility] °[address] °[place] °[phone] ° °[facility]: Residential Program (90 Days)   °[place], [place]      °336-725-8389 or 800-758-6077    ° °[facility] °[place], [place] °(Walk in Hours over the weekend or by referral) ° °[facility] °[address], [place], [place] [address] °([phone] ° °Crisis Mobile: [facility]:  1-877-626-1772 (for crisis response 24 hours a day) °[facility] Hotline:      1-800-256-2452 °Outpatient Psychiatry and Counseling ° °[facility]: Mobile Crisis   Management 24 hours:  1-877-626-1772 ° °[facility] sliding scale fee and walk in schedule: M-F 8am-12pm/1pm-3pm °[address]  °[place], [place] [address] °[phone] ° °[facility] °[address] °[place], [place] [address] °336-703-9650 ° °[facility] ([facility] known [facility])- new patient walk-in appointments available Monday - Friday 8am -3pm.          °[address] °[place], [place] [address] °336-676-6840 or crisis line- 336-676-6905 ° °[facility] Intensive Outpatient Therapy Program °[address] °[place], [place] [address] °336-832-9804 ° °[facility]                  °Crisis Services      °[phone]      °[address]     °[place], [place] [address]                ° °[facility]   °[facility] °[phone] °[address] °[place], [place] [address] ° ° °[facility]          °[address]  °[place], [place] [address]       °([phone] ° °[facility] °[address] °[place], [place] [address] °336-292-1510 ° °[facility]    °[address]    °[place], [place] [address]     °[phone]      ° °Parish McKinney, MD     °[address]     °[place] [address]     °336-282-1251     °  °[facility] °[address] °[place] [address] ° °[facility]     °[address]     °[place], [place]      °336-542-2076      ° °[facility] °Shamsher Ahluwalia, MD °[address] °[place], [place] [address] °[phone] ° °[facility]     °[address]     °[place], [place] [address]     °336-274-1237      ° °[facility] °[address] °[place], [place] [address] °336-854-4450 °Resources for Temporary Residential Assistance/Crisis Centers ° °DAY CENTERS °[facility] °M-F 8am-3pm   °[address] [place], [place] [address]   336-332-0824 °Services include: laundry, barbering, support groups, case management, phone  & computer access, showers, AA/NA mtgs, mental health/substance abuse nurse, job skills class, disability information, VA assistance, spiritual classes, etc.  ° °HOMELESS SHELTERS ° °[facility]     °[facility]   °[address], [place]     °336.271.5959       °       °[facility] (women and children)       °[address] °[place], [place] [address] °336-275-0820 °[email or web address] for application and process °Application Required ° °[facility]   °[address]    °[place] [address]     °[phone]       °             °[facility] °[address] °, [place] [address] °336.273.5572 °336-235-0363(schedule application appt.) °Application Required ° °[facility] (women only)    °[address]     °[place], [place] [address]     °336-884-1039      °  Intake starts 6pm daily °Need valid ID, SSC, & Police report °[facility] [place] °[address] °[place], [place] °336-881-5420 °Application Required ° °[facility] (men only)     °[address]      °[place], [place]     °[phone]      ° °[facility] °(Pregnant women only) °[address] °[place], [place] °336-275-0206 ° °[facility]      °[address] [place] [address]      °[place], [place] [address]     °[phone]      °       °[facility] °[address] °[place], [place] °336-723-1848 °90 day commitment/SA/Application process ° °[facility](men only)     °[address]     °[place], [place]     °[phone]       °Check-in at 7pm     °       °[facility] °[address] °[place], [place] [address] °336-248-6684 °Men/Women/Women and Children must be there by 7 pm ° °[facility] °[place], [place] °336-722-8721                ° °
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
## 2019-10-25 IMAGING — CR DG CHEST 2V
2 series · 2 of 2 positions shown · non-contrast
Comparison: None.

CLINICAL DATA: Chest, neck and back pain this evening.

EXAM:
CHEST  2 VIEW

[chest lat]
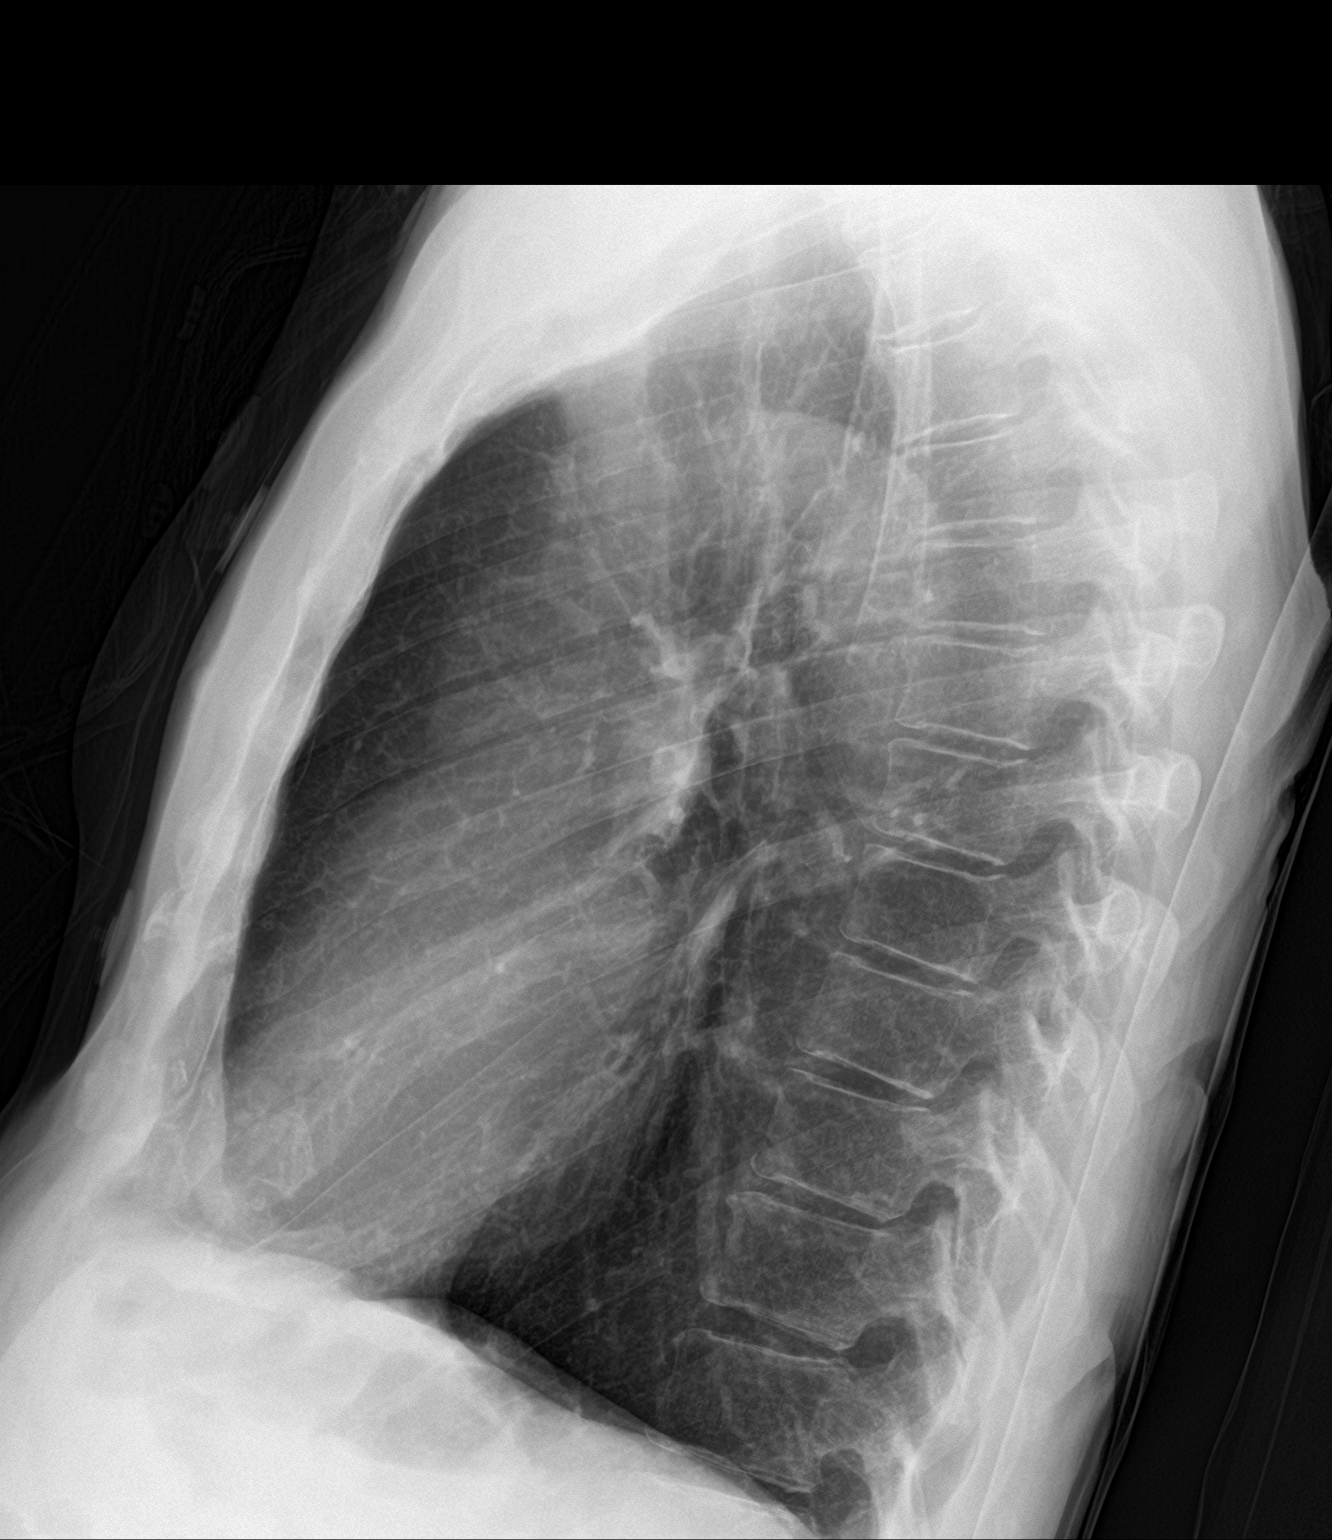

[chest ap]
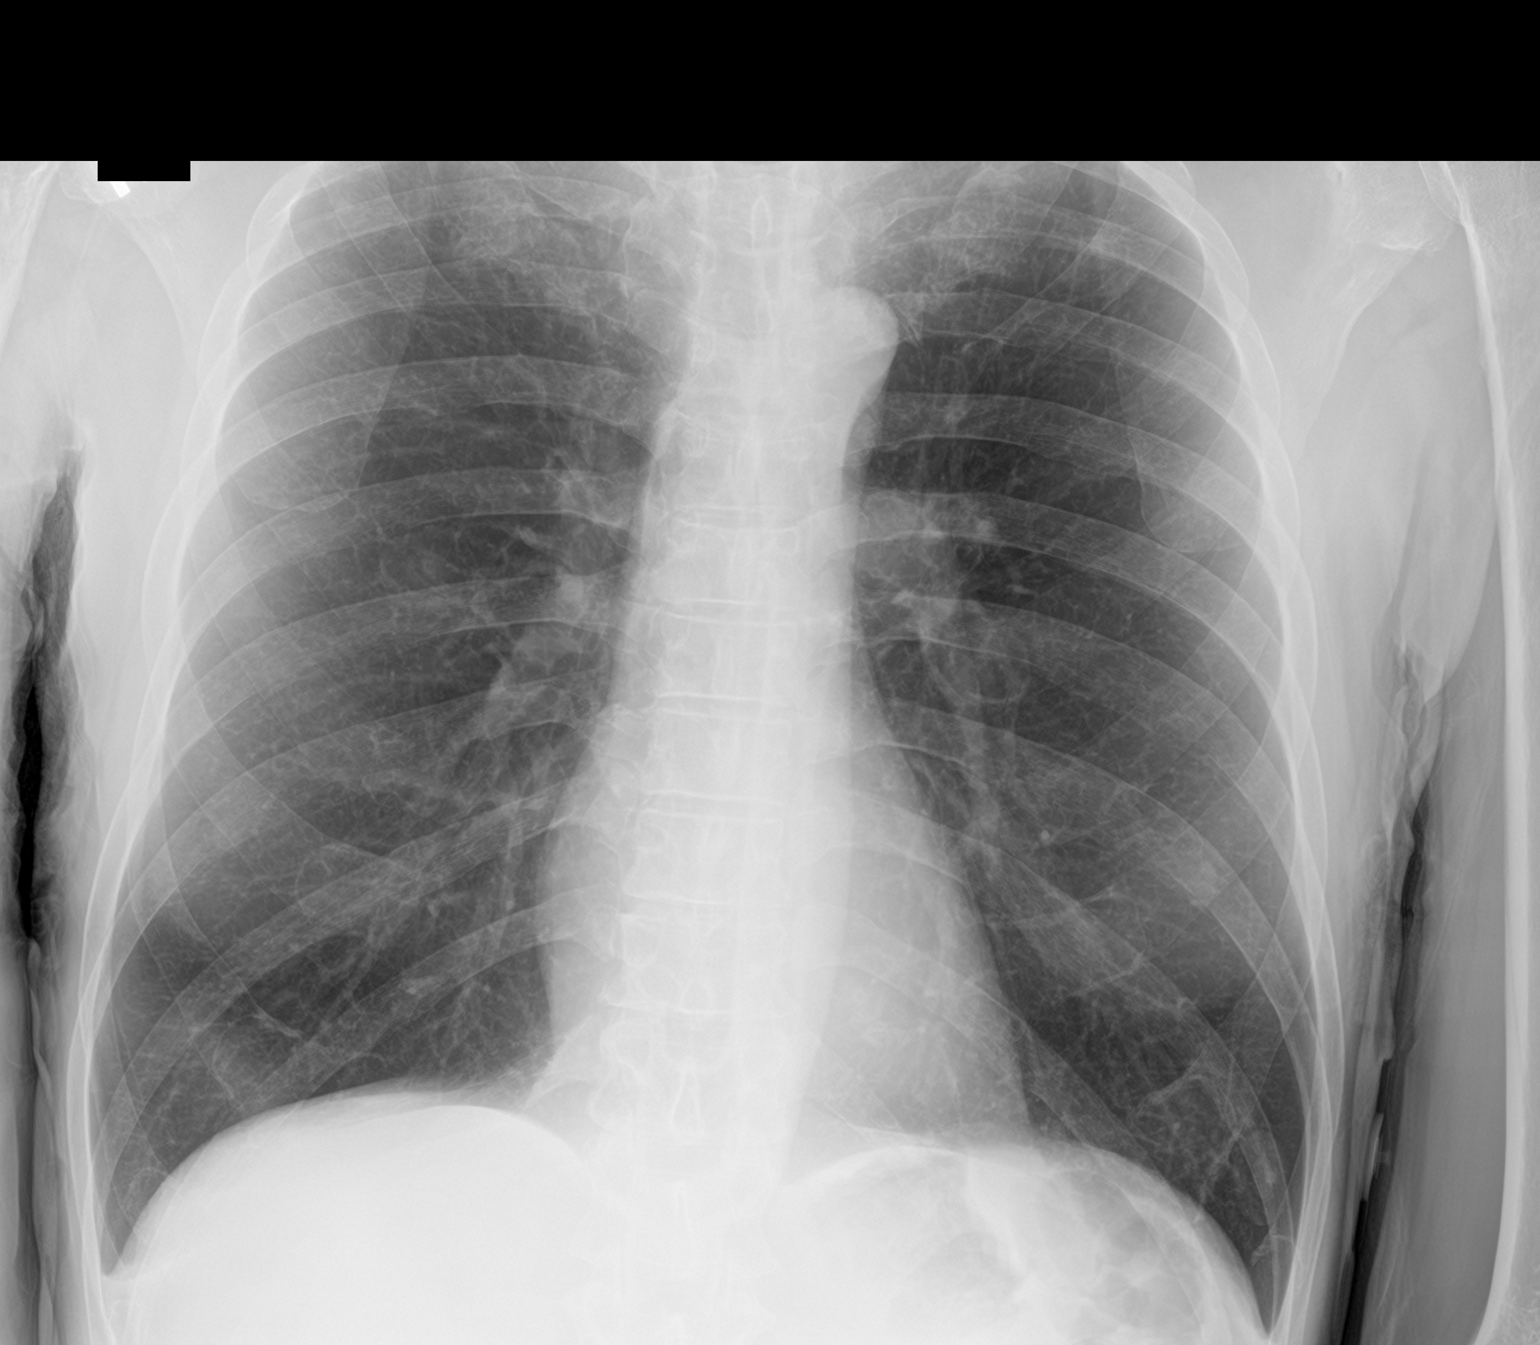

[2 of 2 positions shown; findings below may reference images not displayed]

FINDINGS: The heart size and mediastinal contours are within normal limits.
Hyperinflated appearance of the lungs without pneumonic
consolidation or CHF. No effusion or pneumothorax. The visualized
skeletal structures are unremarkable.
IMPRESSION: Hyperinflated lungs.

## 2019-10-26 IMAGING — DX DG NECK SOFT TISSUE
2 series · 2 of 2 positions shown · non-contrast
Comparison: None.

CLINICAL DATA: Neck pain.

EXAM:
NECK SOFT TISSUES - 1+ VIEW

[neck lat]
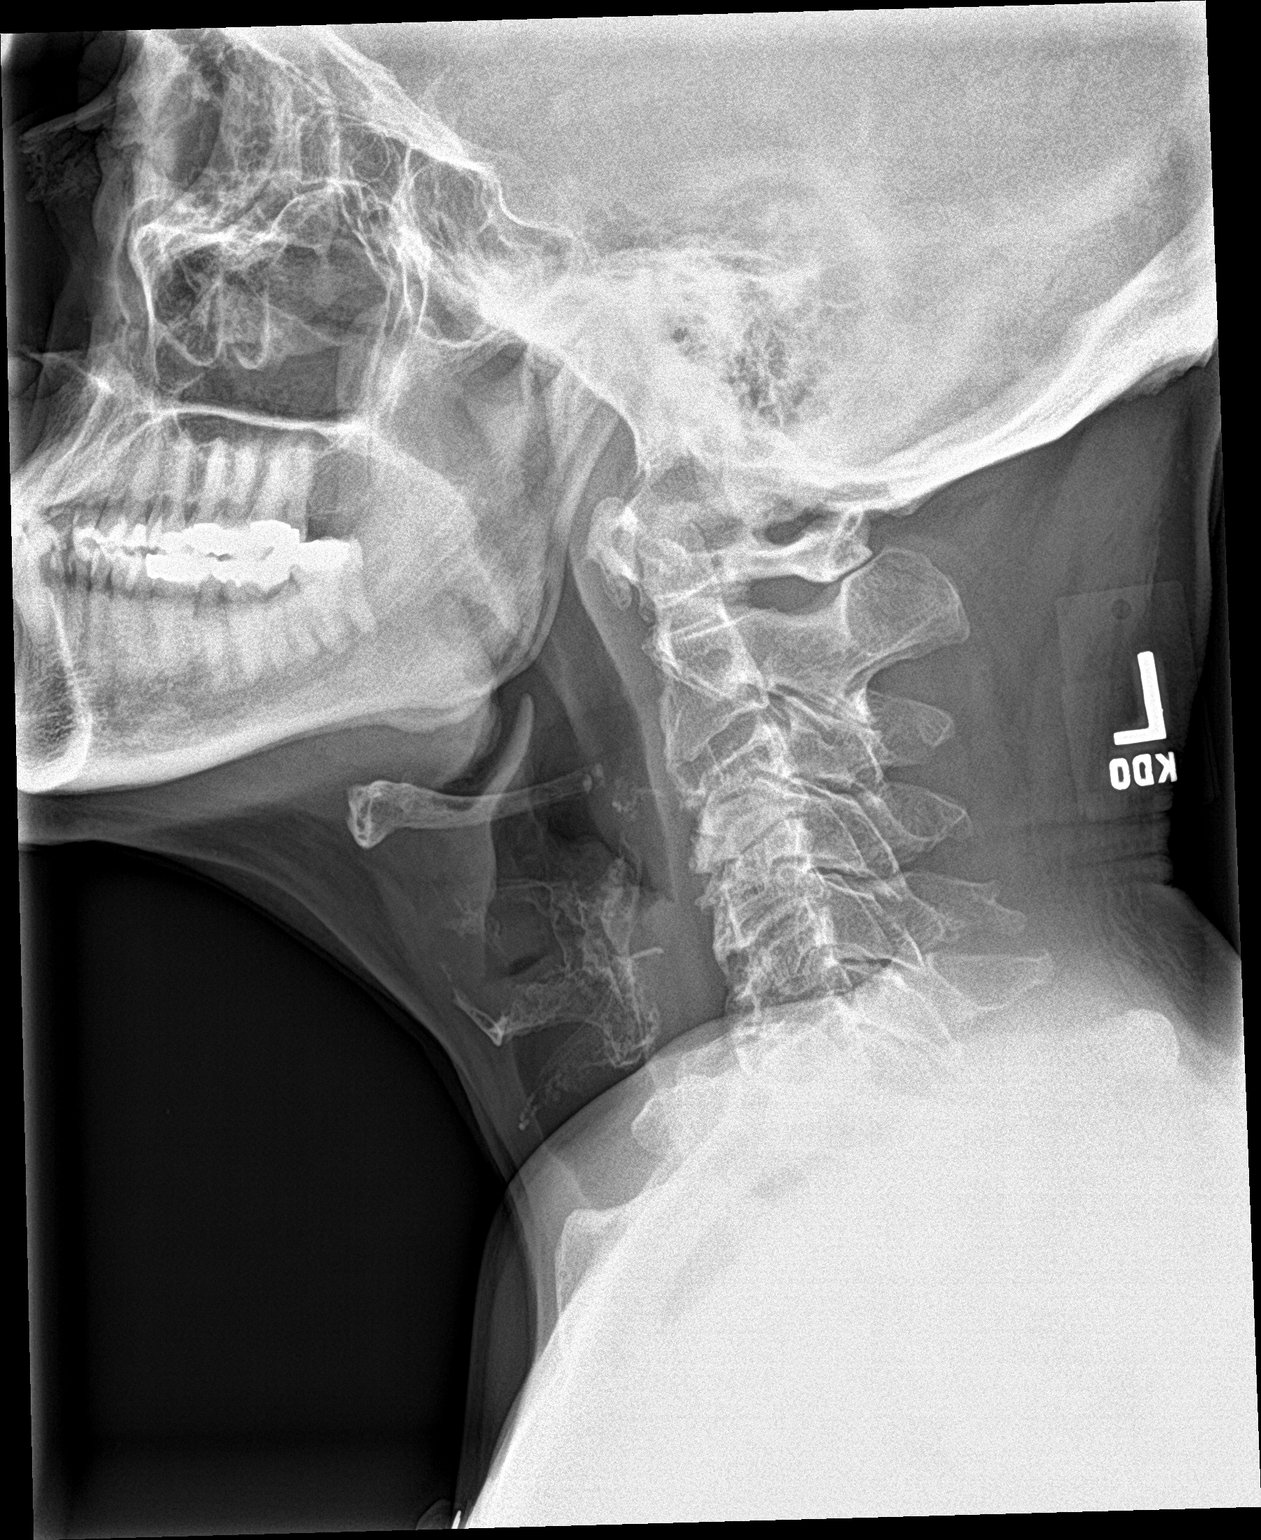

[neck ap]
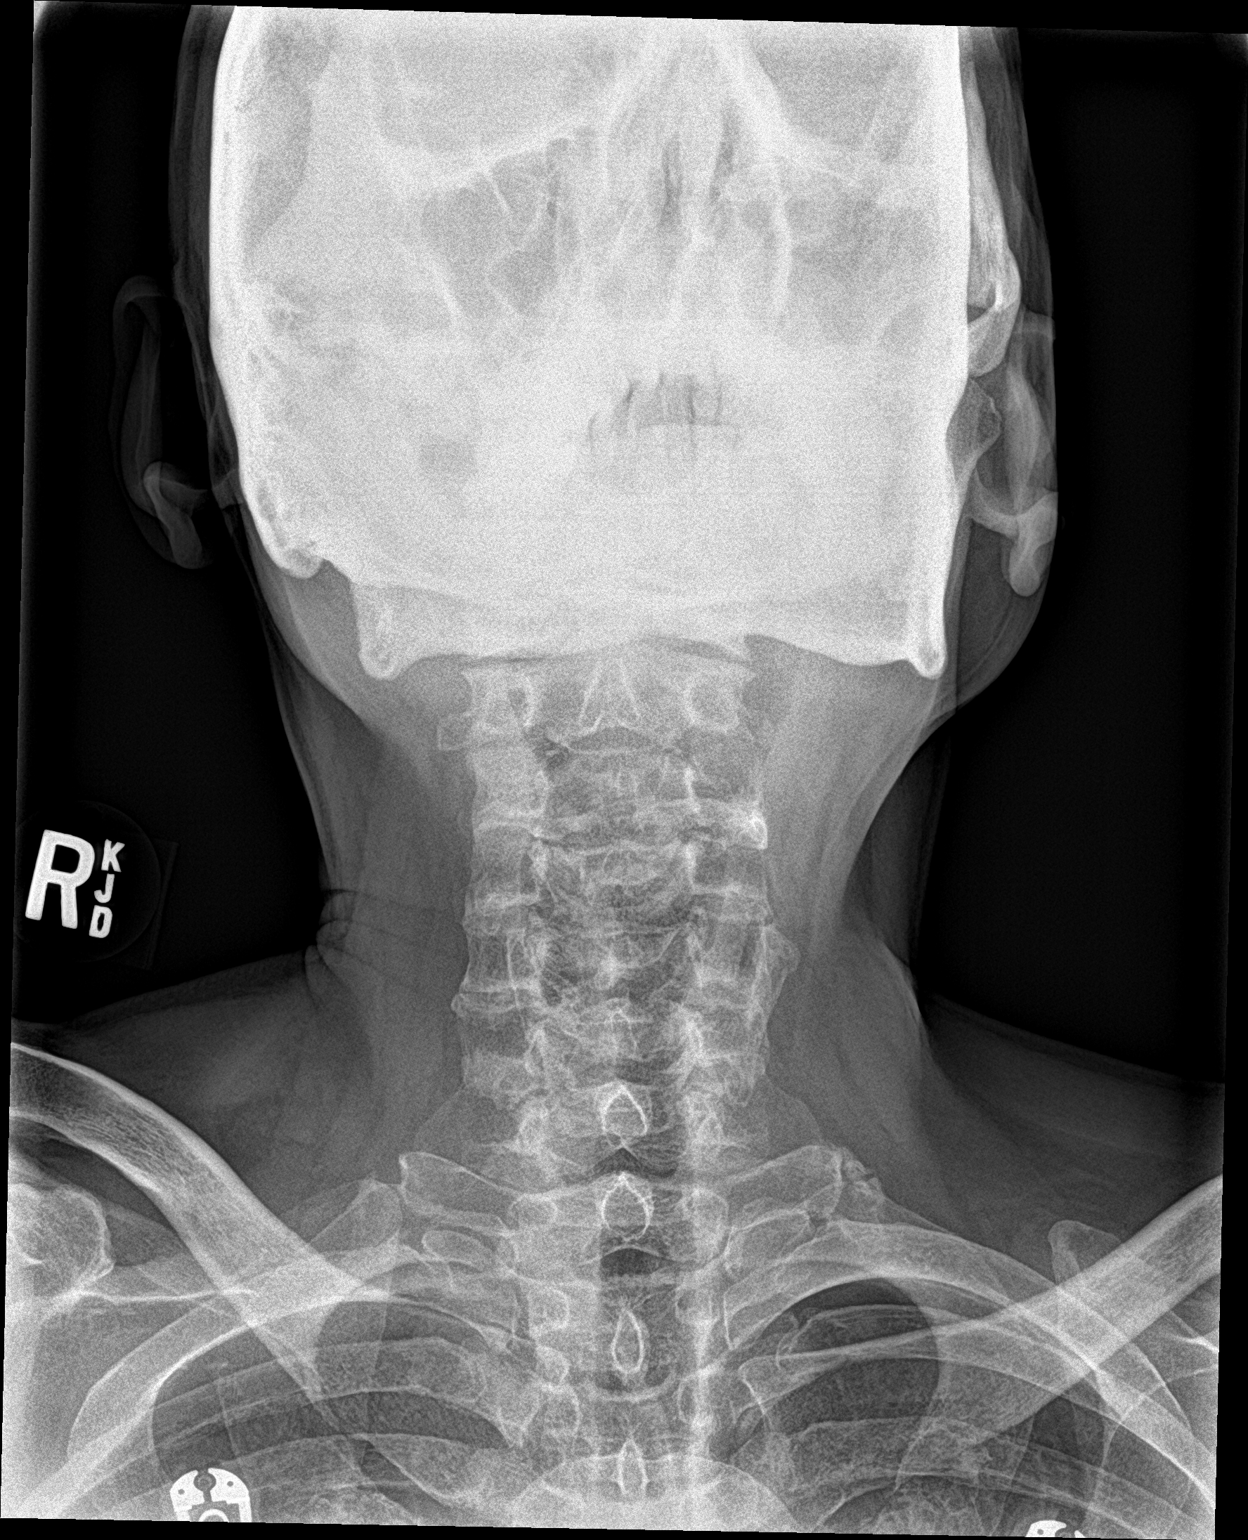

[2 of 2 positions shown; findings below may reference images not displayed]

FINDINGS: There is no evidence of retropharyngeal soft tissue swelling or
epiglottic enlargement. The cervical airway is unremarkable. No
radio-opaque foreign body identified. Soft tissue planes are non
suspicious. Multilevel degenerative disc disease in the cervical
spine. C5-C6 through the cervicothoracic junction are obscured by
overlapping osseous and soft tissue.
IMPRESSION: 1. Unremarkable radiographic appearance of the soft tissues of the
neck.
2. Diffuse degenerative disc disease of the cervical spine.

## 2019-11-28 ENCOUNTER — Emergency Department (HOSPITAL_COMMUNITY)
Admission: EM | Admit: 2019-11-28 | Discharge: 2019-11-28 | Disposition: A | Discharge status: Home / Self Care | Payer: Medicare Other | Attending: Emergency Medicine | Admitting: Emergency Medicine

## 2019-11-28 ENCOUNTER — Emergency Department (HOSPITAL_COMMUNITY): Payer: Medicare Other

## 2019-11-28 ENCOUNTER — Other Ambulatory Visit: Payer: Self-pay

## 2019-11-28 DIAGNOSIS — I10 Essential (primary) hypertension: Secondary | ICD-10-CM | POA: Diagnosis not present

## 2019-11-28 DIAGNOSIS — R4182 Altered mental status, unspecified: Secondary | ICD-10-CM | POA: Diagnosis not present

## 2019-11-28 DIAGNOSIS — T401X1A Poisoning by heroin, accidental (unintentional), initial encounter: Secondary | ICD-10-CM | POA: Insufficient documentation

## 2019-11-28 DIAGNOSIS — F17210 Nicotine dependence, cigarettes, uncomplicated: Secondary | ICD-10-CM | POA: Diagnosis not present

## 2019-11-28 DIAGNOSIS — Z79899 Other long term (current) drug therapy: Secondary | ICD-10-CM | POA: Diagnosis not present

## 2019-11-28 LAB — CBC WITH DIFFERENTIAL/PLATELET
Abs Immature Granulocytes: 0.03 10*3/uL (ref 0.00–0.07)
Basophils Absolute: 0 10*3/uL (ref 0.0–0.1)
Basophils Relative: 0 %
Eosinophils Absolute: 0 10*3/uL (ref 0.0–0.5)
Eosinophils Relative: 0 %
HCT: 40.3 % (ref 39.0–52.0)
Hemoglobin: 13.1 g/dL (ref 13.0–17.0)
Immature Granulocytes: 1 %
Lymphocytes Relative: 11 %
Lymphs Abs: 0.7 10*3/uL (ref 0.7–4.0)
MCH: 33.7 pg (ref 26.0–34.0)
MCHC: 32.5 g/dL (ref 30.0–36.0)
MCV: 103.6 fL — ABNORMAL HIGH (ref 80.0–100.0)
Monocytes Absolute: 0.6 10*3/uL (ref 0.1–1.0)
Monocytes Relative: 10 %
Neutro Abs: 4.6 10*3/uL (ref 1.7–7.7)
Neutrophils Relative %: 78 %
Platelets: 251 10*3/uL (ref 150–400)
RBC: 3.89 MIL/uL — ABNORMAL LOW (ref 4.22–5.81)
RDW: 14.1 % (ref 11.5–15.5)
WBC: 5.9 10*3/uL (ref 4.0–10.5)
nRBC: 0 % (ref 0.0–0.2)

## 2019-11-28 LAB — URINALYSIS, ROUTINE W REFLEX MICROSCOPIC
Bacteria, UA: NONE SEEN
Bilirubin Urine: NEGATIVE
Glucose, UA: 50 mg/dL — AB
Ketones, ur: NEGATIVE mg/dL
Leukocytes,Ua: NEGATIVE
Nitrite: NEGATIVE
Protein, ur: NEGATIVE mg/dL
Specific Gravity, Urine: 1.003 — ABNORMAL LOW (ref 1.005–1.030)
pH: 6 (ref 5.0–8.0)

## 2019-11-28 LAB — COMPREHENSIVE METABOLIC PANEL
ALT: 119 U/L — ABNORMAL HIGH (ref 0–44)
AST: 122 U/L — ABNORMAL HIGH (ref 15–41)
Albumin: 4.4 g/dL (ref 3.5–5.0)
Alkaline Phosphatase: 101 U/L (ref 38–126)
Anion gap: 17 — ABNORMAL HIGH (ref 5–15)
BUN: 8 mg/dL (ref 8–23)
CO2: 22 mmol/L (ref 22–32)
Calcium: 8.9 mg/dL (ref 8.9–10.3)
Chloride: 93 mmol/L — ABNORMAL LOW (ref 98–111)
Creatinine, Ser: 1.11 mg/dL (ref 0.61–1.24)
GFR calc Af Amer: >60 mL/min (ref >60)
GFR calc non Af Amer: >60 mL/min (ref >60)
Glucose, Bld: 147 mg/dL — ABNORMAL HIGH (ref 70–99)
Potassium: 3.8 mmol/L (ref 3.5–5.1)
Sodium: 132 mmol/L — ABNORMAL LOW (ref 135–145)
Total Bilirubin: 0.5 mg/dL (ref 0.3–1.2)
Total Protein: 7.8 g/dL (ref 6.5–8.1)

## 2019-11-28 LAB — RAPID URINE DRUG SCREEN, HOSP PERFORMED
Amphetamines: NOT DETECTED
Barbiturates: NOT DETECTED
Benzodiazepines: NOT DETECTED
Cocaine: NOT DETECTED
Opiates: NOT DETECTED
Tetrahydrocannabinol: NOT DETECTED

## 2019-11-28 LAB — ETHANOL: Alcohol, Ethyl (B): 23 mg/dL — ABNORMAL HIGH (ref <10)

## 2019-11-28 LAB — CK: Total CK: 155 U/L (ref 49–397)

## 2019-11-28 MED ORDER — NALOXONE HCL 2 MG/2ML IJ SOSY: 2.0000 mg | PREFILLED_SYRINGE | Freq: Once | INTRAMUSCULAR | Status: AC

## 2019-11-28 MED ORDER — SODIUM CHLORIDE 0.9 % IV BOLUS
1000.0000 mL | Freq: Once | INTRAVENOUS | Status: AC
  Administered 2019-11-28: 1000 mL via INTRAVENOUS

## 2019-11-28 MED ORDER — NALOXONE HCL 2 MG/2ML IJ SOSY
PREFILLED_SYRINGE | INTRAMUSCULAR | Status: AC
  Administered 2019-11-28: 2 mg via INTRAVENOUS
  Filled 2019-11-28: qty 4

## 2019-11-28 NOTE — ED Notes (Signed)
Pt given water upon request
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
## 2019-11-28 NOTE — ED Notes (Signed)
Pt provided with sandwich, crackers, and juice. Pt given scrub bottoms and a shirt/socks from the clothes supply closet. Pt personal knife returned from security.    Pt verbalizes understanding of DC instructions. Pt belongings returned and is ambulatory out of ED.
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
## 2019-11-28 NOTE — Discharge Instructions (Signed)
Based on your history and exam, we were concerned about narcotic overdose tonight leading to your altered mental status and unresponsive episode.  As you not breathing, you received Narcan and this improved your breathing and symptoms.  After waking up, you did report that you think this was from snorting some heroin which we agree with.  Your alcohol level was mildly elevated.  Please follow-up with your primary doctor and try to avoid narcotic overdose.  Please rest and stay hydrated.  If any symptoms change or worsen, please return to nearest emergency department.

## 2019-11-28 NOTE — ED Notes (Signed)
Pt given bus pass ?

## 2020-10-05 DIAGNOSIS — J40 Bronchitis, not specified as acute or chronic: Secondary | ICD-10-CM | POA: Diagnosis not present

## 2020-10-05 DIAGNOSIS — F17210 Nicotine dependence, cigarettes, uncomplicated: Secondary | ICD-10-CM | POA: Insufficient documentation

## 2020-10-05 DIAGNOSIS — R1030 Lower abdominal pain, unspecified: Secondary | ICD-10-CM | POA: Insufficient documentation

## 2020-10-05 DIAGNOSIS — R110 Nausea: Secondary | ICD-10-CM | POA: Insufficient documentation

## 2020-10-05 DIAGNOSIS — R0602 Shortness of breath: Secondary | ICD-10-CM | POA: Diagnosis present

## 2020-10-05 DIAGNOSIS — R109 Unspecified abdominal pain: Secondary | ICD-10-CM | POA: Insufficient documentation

## 2020-10-05 DIAGNOSIS — I10 Essential (primary) hypertension: Secondary | ICD-10-CM | POA: Insufficient documentation

## 2020-10-06 ENCOUNTER — Other Ambulatory Visit: Payer: Self-pay

## 2020-10-06 ENCOUNTER — Emergency Department (HOSPITAL_COMMUNITY)
Admission: EM | Admit: 2020-10-06 | Discharge: 2020-10-06 | Disposition: A | Discharge status: Home / Self Care | Payer: Medicare Other | Attending: Emergency Medicine | Admitting: Emergency Medicine

## 2020-10-06 ENCOUNTER — Encounter (HOSPITAL_COMMUNITY): Payer: Self-pay | Admitting: Emergency Medicine

## 2020-10-06 ENCOUNTER — Emergency Department (HOSPITAL_COMMUNITY)
Admission: EM | Admit: 2020-10-06 | Discharge: 2020-10-06 | Disposition: A | Discharge status: Home / Self Care | Payer: Medicare Other | Source: Home / Self Care | Attending: Emergency Medicine | Admitting: Emergency Medicine

## 2020-10-06 ENCOUNTER — Encounter (HOSPITAL_COMMUNITY): Payer: Self-pay

## 2020-10-06 ENCOUNTER — Emergency Department (HOSPITAL_COMMUNITY): Payer: Medicare Other

## 2020-10-06 DIAGNOSIS — R1030 Lower abdominal pain, unspecified: Secondary | ICD-10-CM | POA: Insufficient documentation

## 2020-10-06 DIAGNOSIS — F17210 Nicotine dependence, cigarettes, uncomplicated: Secondary | ICD-10-CM | POA: Insufficient documentation

## 2020-10-06 DIAGNOSIS — I10 Essential (primary) hypertension: Secondary | ICD-10-CM | POA: Insufficient documentation

## 2020-10-06 DIAGNOSIS — J40 Bronchitis, not specified as acute or chronic: Secondary | ICD-10-CM | POA: Diagnosis not present

## 2020-10-06 DIAGNOSIS — R110 Nausea: Secondary | ICD-10-CM

## 2020-10-06 LAB — URINALYSIS, ROUTINE W REFLEX MICROSCOPIC
Bacteria, UA: NONE SEEN
Bilirubin Urine: NEGATIVE
Glucose, UA: 150 mg/dL — AB
Ketones, ur: 80 mg/dL — AB
Leukocytes,Ua: NEGATIVE
Nitrite: NEGATIVE
Protein, ur: 100 mg/dL — AB
Specific Gravity, Urine: 1.014 (ref 1.005–1.030)
pH: 6 (ref 5.0–8.0)

## 2020-10-06 LAB — COMPREHENSIVE METABOLIC PANEL
ALT: 17 U/L (ref 0–44)
AST: 45 U/L — ABNORMAL HIGH (ref 15–41)
Albumin: 4.6 g/dL (ref 3.5–5.0)
Alkaline Phosphatase: 100 U/L (ref 38–126)
Anion gap: 18 — ABNORMAL HIGH (ref 5–15)
BUN: 6 mg/dL — ABNORMAL LOW (ref 8–23)
CO2: 27 mmol/L (ref 22–32)
Calcium: 9.3 mg/dL (ref 8.9–10.3)
Chloride: 91 mmol/L — ABNORMAL LOW (ref 98–111)
Creatinine, Ser: 0.87 mg/dL (ref 0.61–1.24)
GFR, Estimated: >60 mL/min (ref >60)
Glucose, Bld: 88 mg/dL (ref 70–99)
Potassium: 3.6 mmol/L (ref 3.5–5.1)
Sodium: 136 mmol/L (ref 135–145)
Total Bilirubin: 1.4 mg/dL — ABNORMAL HIGH (ref 0.3–1.2)
Total Protein: 8.6 g/dL — ABNORMAL HIGH (ref 6.5–8.1)

## 2020-10-06 LAB — CBC WITH DIFFERENTIAL/PLATELET
Abs Immature Granulocytes: 0.01 10*3/uL (ref 0.00–0.07)
Basophils Absolute: 0.1 10*3/uL (ref 0.0–0.1)
Basophils Relative: 1 %
Eosinophils Absolute: 0 10*3/uL (ref 0.0–0.5)
Eosinophils Relative: 1 %
HCT: 45.1 % (ref 39.0–52.0)
Hemoglobin: 15.4 g/dL (ref 13.0–17.0)
Immature Granulocytes: 0 %
Lymphocytes Relative: 28 %
Lymphs Abs: 1 10*3/uL (ref 0.7–4.0)
MCH: 33.3 pg (ref 26.0–34.0)
MCHC: 34.1 g/dL (ref 30.0–36.0)
MCV: 97.4 fL (ref 80.0–100.0)
Monocytes Absolute: 0.3 10*3/uL (ref 0.1–1.0)
Monocytes Relative: 7 %
Neutro Abs: 2.3 10*3/uL (ref 1.7–7.7)
Neutrophils Relative %: 63 %
Platelets: 143 10*3/uL — ABNORMAL LOW (ref 150–400)
RBC: 4.63 MIL/uL (ref 4.22–5.81)
RDW: 15.7 % — ABNORMAL HIGH (ref 11.5–15.5)
WBC: 3.7 10*3/uL — ABNORMAL LOW (ref 4.0–10.5)
nRBC: 0 % (ref 0.0–0.2)

## 2020-10-06 LAB — BRAIN NATRIURETIC PEPTIDE: B Natriuretic Peptide: 25.2 pg/mL (ref 0.0–100.0)

## 2020-10-06 LAB — LIPASE, BLOOD: Lipase: 27 U/L (ref 11–51)

## 2020-10-06 LAB — TROPONIN I (HIGH SENSITIVITY): Troponin I (High Sensitivity): 16 ng/L (ref <18)

## 2020-10-06 MED ORDER — LACTATED RINGERS IV BOLUS
1000.0000 mL | Freq: Once | INTRAVENOUS | Status: AC
  Administered 2020-10-06: 1000 mL via INTRAVENOUS

## 2020-10-06 MED ORDER — LORAZEPAM 2 MG/ML IJ SOLN
1.0000 mg | Freq: Once | INTRAMUSCULAR | Status: AC
  Administered 2020-10-06: 1 mg via INTRAVENOUS
  Filled 2020-10-06: qty 1

## 2020-10-06 MED ORDER — ONDANSETRON HCL 4 MG/2ML IJ SOLN
4.0000 mg | Freq: Once | INTRAMUSCULAR | Status: AC
  Administered 2020-10-06: 4 mg via INTRAVENOUS
  Filled 2020-10-06: qty 2

## 2020-10-06 MED ORDER — DEXAMETHASONE SODIUM PHOSPHATE 10 MG/ML IJ SOLN
10.0000 mg | Freq: Once | INTRAMUSCULAR | Status: AC
  Administered 2020-10-06: 10 mg via INTRAMUSCULAR
  Filled 2020-10-06: qty 1

## 2020-10-06 MED ORDER — ONDANSETRON 4 MG PO TBDP: 4.0000 mg | ORAL_TABLET | Freq: Three times a day (TID) | ORAL | 0 refills | Status: AC | PRN

## 2020-10-06 MED ORDER — ALBUTEROL SULFATE HFA 108 (90 BASE) MCG/ACT IN AERS: 2.0000 | INHALATION_SPRAY | RESPIRATORY_TRACT | Status: DC | PRN

## 2020-10-06 NOTE — ED Provider Notes (Signed)
[facility] EMERGENCY DEPARTMENT Provider Note   CSN: [id number] Arrival date & time: 10/06/20  1314     History Chief Complaint  Patient presents with  . Nausea    Larry Byrd is a 65 y.o. male.  Patient is a 65 year old male with a history of alcohol use, diverticulosis, hypertension who presents with nausea and abdominal pain.  Patient reports that his nausea and abdominal pain started yesterday.  He was seen here for similar around 2 AM this morning.  Patient states that he had the same symptoms at that time.  They diagnosed him with bronchitis and discharged him home with steroids.  Patient reports that he has not had any food or water since then.  He has crampy lower abdominal pain.  He reports that this pain is likely because he has not eaten anything all day.  Patient states that he never actually left the emergency department.  He states that after he was discharged she sat out there until the security guard told him he either needed to check back in or leave.  Patient decided to check back in.        Past Medical History:  Diagnosis Date  . Alcohol dependence (HCC)   . Diverticulosis   . Enlarged prostate   . ETOH abuse    PT drinks daily   . Hypertension   . Major depression, chronic     Patient Active Problem List   Diagnosis Date Noted  . High anion gap metabolic acidosis 07/10/2019  . Oropharyngeal dysphagia 07/10/2019  . Alcohol withdrawal seizure (HCC) 05/14/2019  . Malnutrition of moderate degree 09/13/2017  . Hypertension 09/10/2017  . AKI (acute kidney injury) (HCC) 09/10/2017  . Chest pain 09/10/2017  . Hyponatremia 09/10/2017  . Hypotension 09/10/2017  . Near syncope 09/10/2017  . Neck pain 09/10/2017    History reviewed. No pertinent surgical history.     No family history on file.  Social History   Tobacco Use  . Smoking status: Current Every Day Smoker    Types: Cigarettes  . Smokeless tobacco: Never Used   Vaping Use  . Vaping Use: Never used  Substance Use Topics  . Alcohol use: Yes  . Drug use: No    Home Medications Prior to Admission medications   Medication Sig Start Date End Date Taking? Authorizing Provider  ondansetron (ZOFRAN ODT) 4 MG disintegrating tablet Take 1 tablet (4 mg total) by mouth every 8 (eight) hours as needed for nausea or vomiting. 10/06/20  Yes Kugler, [place], MD  chlordiazePOXIDE (LIBRIUM) 25 MG capsule 50mg  PO TID x 1D, then 25-50mg  PO BID X 1D, then 25-50mg  PO QD X 1D Patient not taking: Reported on 11/28/2019 09/29/19   Hunter, 11/29/19, MD  folic acid (FOLVITE) 1 MG tablet Take 1 tablet (1 mg total) by mouth daily. Patient not taking: Reported on 09/28/2019 07/13/19   07/15/19, MD  naltrexone (DEPADE) 50 MG tablet Take 1 tablet (50 mg total) by mouth daily. Patient not taking: Reported on 09/28/2019 07/12/19   07/14/19, MD  thiamine 100 MG tablet Take 1 tablet (100 mg total) by mouth daily. Patient not taking: Reported on 09/28/2019 07/13/19   07/15/19, MD  tolnaftate (TINACTIN) 1 % cream Apply 1 application topically 2 (two) times daily. Patient not taking: Reported on 11/28/2019 09/29/19   11/29/19, MD  amLODipine (NORVASC) 5 MG tablet Take 1 tablet (5 mg total) by mouth daily. Patient not taking: Reported on 03/29/2019  10/08/17 08/13/19  Donnetta Hutching, MD  lisinopril-hydrochlorothiazide (PRINZIDE,ZESTORETIC) 20-12.5 MG tablet Take 1 tablet by mouth daily. Patient not taking: Reported on 03/29/2019 08/11/17 08/13/19  Elson Areas, PA-C  potassium chloride SA (K-DUR,KLOR-CON) 20 MEQ tablet Take 2 tablets (40 mEq total) by mouth daily. Patient not taking: Reported on 03/29/2019 08/11/17 08/13/19  Elson Areas, PA-C    Allergies    Patient has no known allergies.  Review of Systems   Review of Systems  Constitutional: Negative for chills and fever.  HENT: Negative for ear pain and sore throat.   Eyes: Negative for pain and visual  disturbance.  Respiratory: Negative for cough and shortness of breath.   Cardiovascular: Negative for chest pain and palpitations.  Gastrointestinal: Positive for abdominal pain and nausea. Negative for constipation, diarrhea and vomiting.  Genitourinary: Negative for dysuria and hematuria.  Musculoskeletal: Negative for arthralgias and back pain.  Skin: Negative for color change and rash.  Neurological: Negative for seizures and syncope.  All other systems reviewed and are negative.   Physical Exam Updated Vital Signs BP (!) 150/70 (BP Location: Left Arm)   Pulse 80   Temp 98.2 F (36.8 C) (Oral)   Resp 20   SpO2 100%   Physical Exam Vitals and nursing note reviewed.  Constitutional:      Appearance: He is well-developed.  HENT:     Head: Normocephalic and atraumatic.  Eyes:     Conjunctiva/sclera: Conjunctivae normal.  Cardiovascular:     Rate and Rhythm: Normal rate and regular rhythm.     Heart sounds: No murmur heard.   Pulmonary:     Effort: Pulmonary effort is normal. No respiratory distress.     Breath sounds: Normal breath sounds.  Abdominal:     General: There is no distension.     Palpations: Abdomen is soft.     Tenderness: There is no abdominal tenderness. There is no right CVA tenderness, left CVA tenderness, guarding or rebound.  Musculoskeletal:     Cervical back: Neck supple.  Skin:    General: Skin is warm and dry.  Neurological:     Mental Status: He is alert.     ED Results / Procedures / Treatments   Labs (all labs ordered are listed, but only abnormal results are displayed) Labs Reviewed  URINALYSIS, ROUTINE W REFLEX MICROSCOPIC - Abnormal; Notable for the following components:      Result Value   Glucose, UA 150 (*)    Hgb urine dipstick MODERATE (*)    Ketones, ur 80 (*)    Protein, ur 100 (*)    All other components within normal limits  LIPASE, BLOOD   EKG None  Radiology DG Chest 2 View  Result Date: 10/06/2020 CLINICAL  DATA:  Shortness of breath EXAM: CHEST - 2 VIEW COMPARISON:  11/28/2019, CT 3 12/2019 FINDINGS: Hyperinflation without focal opacity, pleural effusion, or pneumothorax. Stable cardiomediastinal silhouette. No pneumothorax. IMPRESSION: No active disease.  Hyperinflation Electronically Signed   By: Jasmine Pang M.D.   On: 10/06/2020 02:07   Procedures Procedures  Medications Ordered in ED Medications  lactated ringers bolus 1,000 mL (0 mLs Intravenous Stopped 10/06/20 1746)  ondansetron (ZOFRAN) injection 4 mg (4 mg Intravenous Given 10/06/20 1614)  LORazepam (ATIVAN) injection 1 mg (1 mg Intravenous Given 10/06/20 1818)    ED Course  I have reviewed the triage vital signs and the nursing notes.  Pertinent labs & imaging results that were available during my care of the  patient were reviewed by me and considered in my medical decision making (see chart for details).    MDM Rules/Calculators/A&P                         Patient presenting with cramping abdominal pain and feelings of nausea.  Patient states that he had the symptoms when he originally checked into the emergency department earlier this morning around 2 AM, however felt that his symptoms did not improve when he was discharged.  Patient states that he never left the emergency department lobby.  Thus, patient has not had anything to eat or drink all day.  Per patient, he feels as though his abdominal pain feels like he has not eaten.  He also feels significantly dehydrated.  On exam, patient is hemodynamically stable.  Blood pressure is mildly elevated but appropriate.  He is afebrile.  Heart rate originally in the low 100s, however he is saturating 100% on room air.  Patient likely dehydrated given he has not had any water or food all day as he has been sitting in the waiting room.  Patient also reports last drink was just before arrival to the emergency department at 2 AM this morning.  We will give him 1 L LR bolus.  And additionally we  will give him some Zofran for nausea.  Patient without any abdominal tenderness on exam.  He has no rebound or guarding.  He has no CVA tenderness.  Low clinical suspicion for intra-abdominal acute process.  Will not order CT scan at this time.  Will treat patient's symptoms.  I have reviewed patient's labs from earlier today.  No evidence for significant transaminitis as patient's AST is 45 previously was 122, no AKI, no electrolyte abnormality, no leukocytosis, and no anemia.  Patient does have elevated total bilirubin at 1.4, however patient without any right upper quadrant tenderness.  Murphy sign absent.  Order lipase given patient's abdominal pain was not ordered earlier.  This was normal.  Low suspicion for pancreatitis.  Given lower abdominal cramping, UA ordered to evaluate for UTI.  UA showed no evidence for UTI.  After liter bolus, patient's heart rate improved with a pulse of 86.  Patient reports feeling significantly better.  Social work consult placed as patient reports he is homeless and has nowhere to go.  We will prescribe him ODT Zofran for nausea control and symptomatic control over the next few days.  Will encourage patient to continue his medications that were prescribed to him at discharge earlier today.  Overall, stable for discharge home.   Final Clinical Impression(s) / ED Diagnoses Final diagnoses:  Nausea    Rx / DC Orders ED Discharge Orders         Ordered    ondansetron (ZOFRAN ODT) 4 MG disintegrating tablet  Every 8 hours PRN        10/06/20 1911           Kugler, [place], MD 10/07/20 0017    Tilden Fossa, MD 10/08/20 [phone]

## 2020-10-06 NOTE — ED Triage Notes (Signed)
Pt reports that he is having a lot of phlegm in his throat, coughing, sob, and  right side is hurting. Denies fever, nausea, vomiting, diarrhea.

## 2020-10-06 NOTE — ED Provider Notes (Signed)
[facility] EMERGENCY DEPARTMENT Provider Note   CSN: [id number] Arrival date & time: 10/05/20  2337     History Chief Complaint  Patient presents with  . Cough  . Shortness of Breath    Larry Byrd is a 65 y.o. male.  Patient presents to the emergency department with complaints of cough and shortness of breath.  Patient reports that symptoms have been ongoing for "a long time".  He has noticed increased sputum production.  No associated chest pain.  He has noticed some right posterior flank pain, denies urinary symptoms.        Past Medical History:  Diagnosis Date  . Alcohol dependence (HCC)   . Diverticulosis   . Enlarged prostate   . ETOH abuse    PT drinks daily   . Hypertension   . Major depression, chronic     Patient Active Problem List   Diagnosis Date Noted  . High anion gap metabolic acidosis 07/10/2019  . Oropharyngeal dysphagia 07/10/2019  . Alcohol withdrawal seizure (HCC) 05/14/2019  . Malnutrition of moderate degree 09/13/2017  . Hypertension 09/10/2017  . AKI (acute kidney injury) (HCC) 09/10/2017  . Chest pain 09/10/2017  . Hyponatremia 09/10/2017  . Hypotension 09/10/2017  . Near syncope 09/10/2017  . Neck pain 09/10/2017    History reviewed. No pertinent surgical history.     History reviewed. No pertinent family history.  Social History   Tobacco Use  . Smoking status: Current Every Day Smoker    Types: Cigarettes  . Smokeless tobacco: Never Used  Vaping Use  . Vaping Use: Never used  Substance Use Topics  . Alcohol use: Yes  . Drug use: No    Home Medications Prior to Admission medications   Medication Sig Start Date End Date Taking? Authorizing Provider  chlordiazePOXIDE (LIBRIUM) 25 MG capsule 50mg  PO TID x 1D, then 25-50mg  PO BID X 1D, then 25-50mg  PO QD X 1D Patient not taking: Reported on 11/28/2019 09/29/19   Hunter, 11/29/19, MD  folic acid (FOLVITE) 1 MG tablet Take 1 tablet (1 mg total) by mouth  daily. Patient not taking: Reported on 09/28/2019 07/13/19   07/15/19, MD  naltrexone (DEPADE) 50 MG tablet Take 1 tablet (50 mg total) by mouth daily. Patient not taking: Reported on 09/28/2019 07/12/19   07/14/19, MD  thiamine 100 MG tablet Take 1 tablet (100 mg total) by mouth daily. Patient not taking: Reported on 09/28/2019 07/13/19   07/15/19, MD  tolnaftate (TINACTIN) 1 % cream Apply 1 application topically 2 (two) times daily. Patient not taking: Reported on 11/28/2019 09/29/19   11/29/19, MD  amLODipine (NORVASC) 5 MG tablet Take 1 tablet (5 mg total) by mouth daily. Patient not taking: Reported on 03/29/2019 10/08/17 08/13/19  08/15/19, MD  lisinopril-hydrochlorothiazide (PRINZIDE,ZESTORETIC) 20-12.5 MG tablet Take 1 tablet by mouth daily. Patient not taking: Reported on 03/29/2019 08/11/17 08/13/19  08/15/19, PA-C  potassium chloride SA (K-DUR,KLOR-CON) 20 MEQ tablet Take 2 tablets (40 mEq total) by mouth daily. Patient not taking: Reported on 03/29/2019 08/11/17 08/13/19  08/15/19, PA-C    Allergies    Patient has no known allergies.  Review of Systems   Review of Systems  Respiratory: Positive for shortness of breath.   Genitourinary: Positive for flank pain.  All other systems reviewed and are negative.   Physical Exam Updated Vital Signs BP (!) 143/82   Pulse 76   Temp 97.7 F (36.5 C) (  Oral)   Resp 14   Ht 5\' 10"  (1.778 m)   Wt 59 kg   SpO2 98%   BMI 18.65 kg/m   Physical Exam Vitals and nursing note reviewed.  Constitutional:      General: He is not in acute distress.    Appearance: Normal appearance. He is well-developed.  HENT:     Head: Normocephalic and atraumatic.     Right Ear: Hearing normal.     Left Ear: Hearing normal.     Nose: Nose normal.  Eyes:     Conjunctiva/sclera: Conjunctivae normal.     Pupils: Pupils are equal, round, and reactive to light.  Cardiovascular:     Rate and Rhythm: Regular rhythm.      Heart sounds: S1 normal and S2 normal. No murmur heard. No friction rub. No gallop.   Pulmonary:     Effort: Pulmonary effort is normal. No respiratory distress.     Breath sounds: Normal breath sounds.  Chest:     Chest wall: No tenderness.  Abdominal:     General: Bowel sounds are normal.     Palpations: Abdomen is soft.     Tenderness: There is no abdominal tenderness. There is no guarding or rebound. Negative signs include Murphy's sign and McBurney's sign.     Hernia: No hernia is present.  Musculoskeletal:        General: Normal range of motion.     Cervical back: Normal range of motion and neck supple.  Skin:    General: Skin is warm and dry.     Findings: No rash.  Neurological:     Mental Status: He is alert and oriented to person, place, and time.     GCS: GCS eye subscore is 4. GCS verbal subscore is 5. GCS motor subscore is 6.     Cranial Nerves: No cranial nerve deficit.     Sensory: No sensory deficit.     Coordination: Coordination normal.  Psychiatric:        Speech: Speech normal.        Behavior: Behavior normal.        Thought Content: Thought content normal.     ED Results / Procedures / Treatments   Labs (all labs ordered are listed, but only abnormal results are displayed) Labs Reviewed  CBC WITH DIFFERENTIAL/PLATELET - Abnormal; Notable for the following components:      Result Value   WBC 3.7 (*)    RDW 15.7 (*)    Platelets 143 (*)    All other components within normal limits  COMPREHENSIVE METABOLIC PANEL - Abnormal; Notable for the following components:   Chloride 91 (*)    BUN 6 (*)    Total Protein 8.6 (*)    AST 45 (*)    Total Bilirubin 1.4 (*)    Anion gap 18 (*)    All other components within normal limits  BRAIN NATRIURETIC PEPTIDE  URINALYSIS, ROUTINE W REFLEX MICROSCOPIC  TROPONIN I (HIGH SENSITIVITY)    EKG EKG Interpretation  Date/Time:  Monday October 06 2020 00:39:21 EDT Ventricular Rate:  87 PR Interval:  144 QRS  Duration: 90 QT Interval:  366 QTC Calculation: 440 R Axis:   89 Text Interpretation: Normal sinus rhythm Minimal voltage criteria for LVH, may be normal variant ( Cornell product ) Borderline ECG Confirmed by [id number] [phone]) on 10/06/2020 2:05:28 AM   Radiology DG Chest 2 View  Result Date: 10/06/2020 CLINICAL DATA:  Shortness of  breath EXAM: CHEST - 2 VIEW COMPARISON:  11/28/2019, CT 3 12/2019 FINDINGS: Hyperinflation without focal opacity, pleural effusion, or pneumothorax. Stable cardiomediastinal silhouette. No pneumothorax. IMPRESSION: No active disease.  Hyperinflation Electronically Signed   By: Jasmine Pang M.D.   On: 10/06/2020 02:07    Procedures Procedures   Medications Ordered in ED Medications  albuterol (VENTOLIN HFA) 108 (90 Base) MCG/ACT inhaler 2 puff (has no administration in time range)  dexamethasone (DECADRON) injection 10 mg (has no administration in time range)    ED Course  I have reviewed the triage vital signs and the nursing notes.  Pertinent labs & imaging results that were available during my care of the patient were reviewed by me and considered in my medical decision making (see chart for details).    MDM Rules/Calculators/A&P                          Patient presents to the emergency department for evaluation of cough and shortness of breath.  He reports an increased sputum production with his cough.  He does have history of chronic tobacco abuse.  His vital signs are normal here in the emergency department.  No hypoxia.  No tachycardia or tachypnea.  Patient is not experiencing associated chest pain.  EKG without ischemia.  Troponin negative.  No evidence of congestive heart failure.  Patient reports that he is currently homeless, therefore unlikely to fill prescriptions.  Will treat with Decadron and provide with an inhaler for outpatient use.  Given return precautions.  Final Clinical Impression(s) / ED Diagnoses Final diagnoses:   Bronchitis    Rx / DC Orders ED Discharge Orders    None       Othal Kubitz, Canary Brim, MD 10/06/20 [phone]

## 2020-10-06 NOTE — ED Notes (Signed)
NT informed this RN that pt never left the hospital waiting room after he was discharged earlier today. Security informed him that he needed to leave or check back in. Pt proceeded to check in.

## 2020-10-06 NOTE — Care Management (Signed)
ED CM met with patient to provide him with homeless resources, also provided him with number to [facility].

## 2020-10-06 NOTE — ED Triage Notes (Signed)
Patient here from home. Complaint of nausea today. Patient states he was seen here this morning for same and was discharged but has felt bad ever since.

## 2021-05-23 IMAGING — DX DG CHEST 2V
3 series · 3 of 3 positions shown · non-contrast
Comparison: Radiographs dated 03/29/2019

CLINICAL DATA: Chest pain since a fall a few days ago. The
microwave then fell on top of him. Right-sided rib pain.

EXAM:
CHEST - 2 VIEW

[chest ap (1 of 2)]
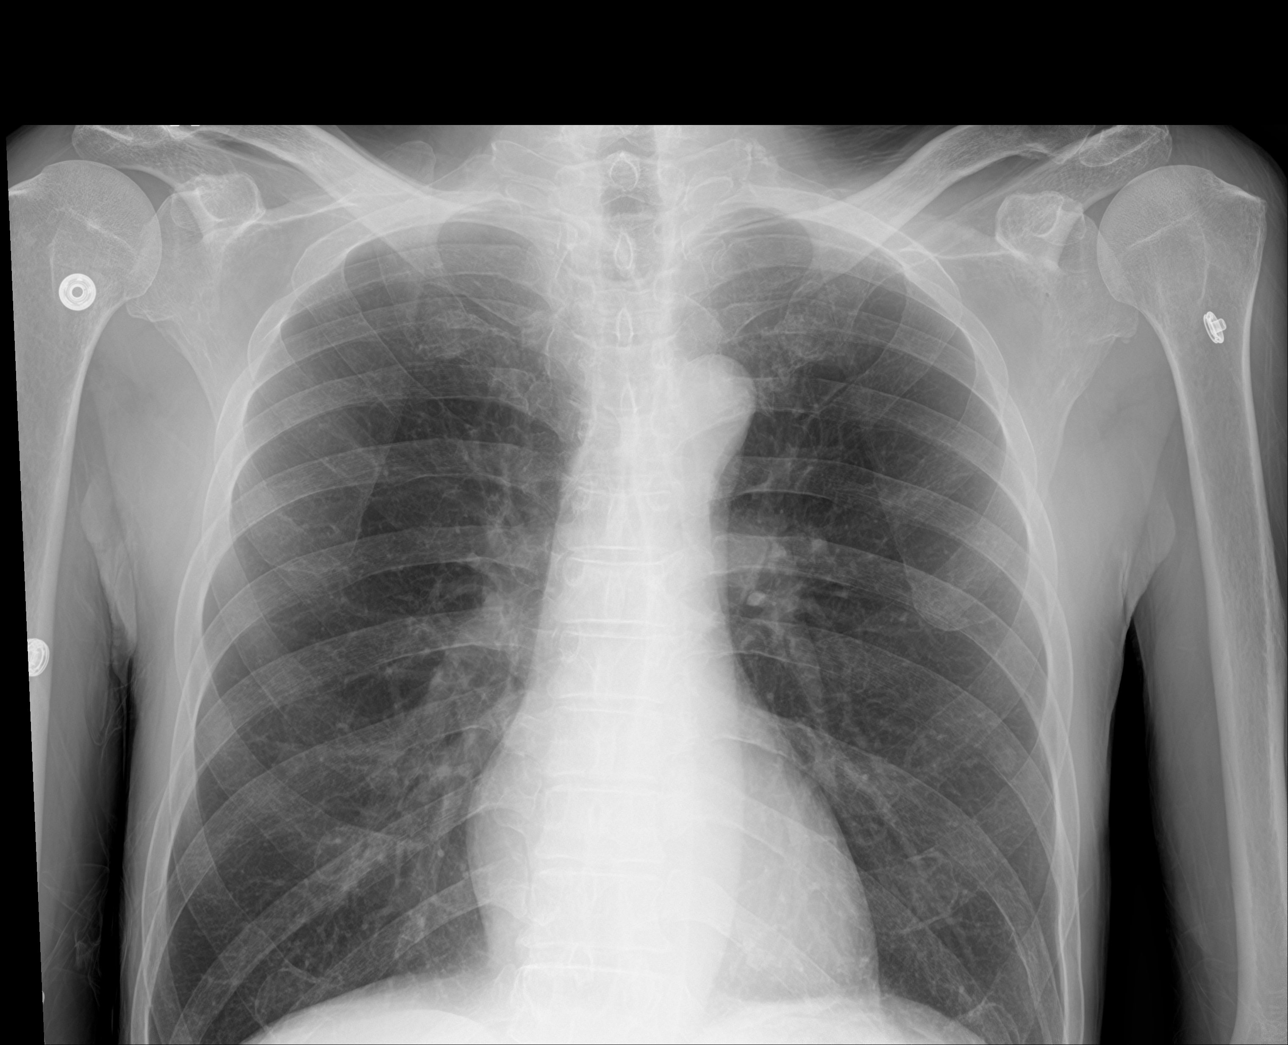

[chest lat]
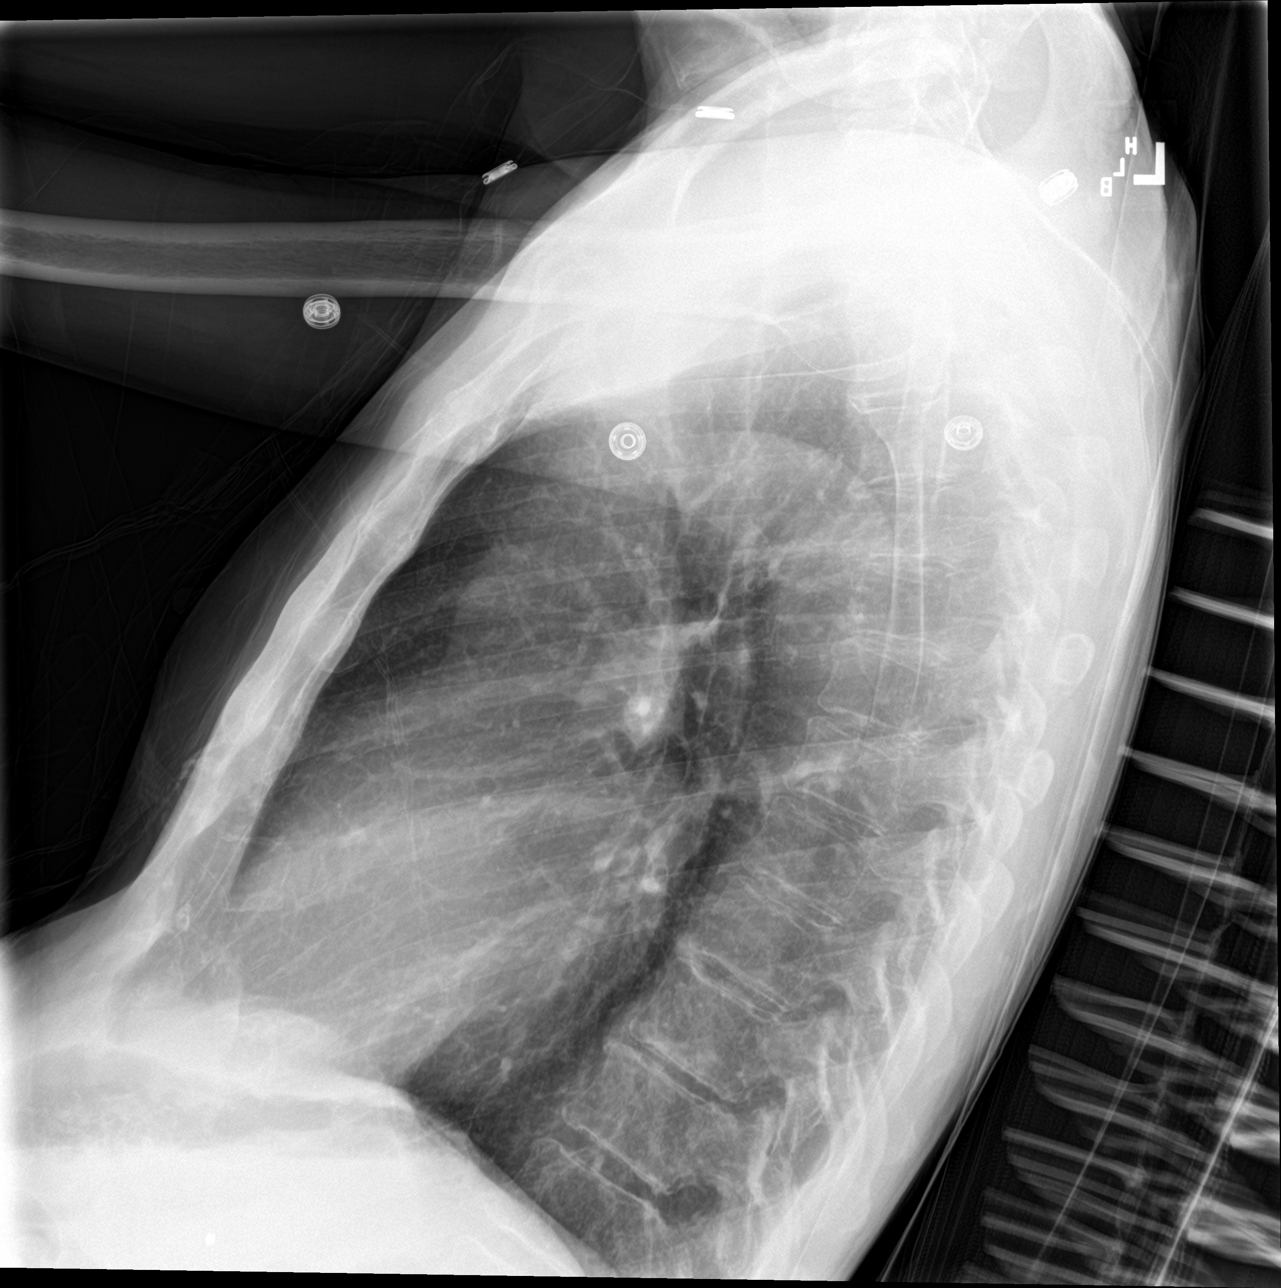

[chest ap (2 of 2)]
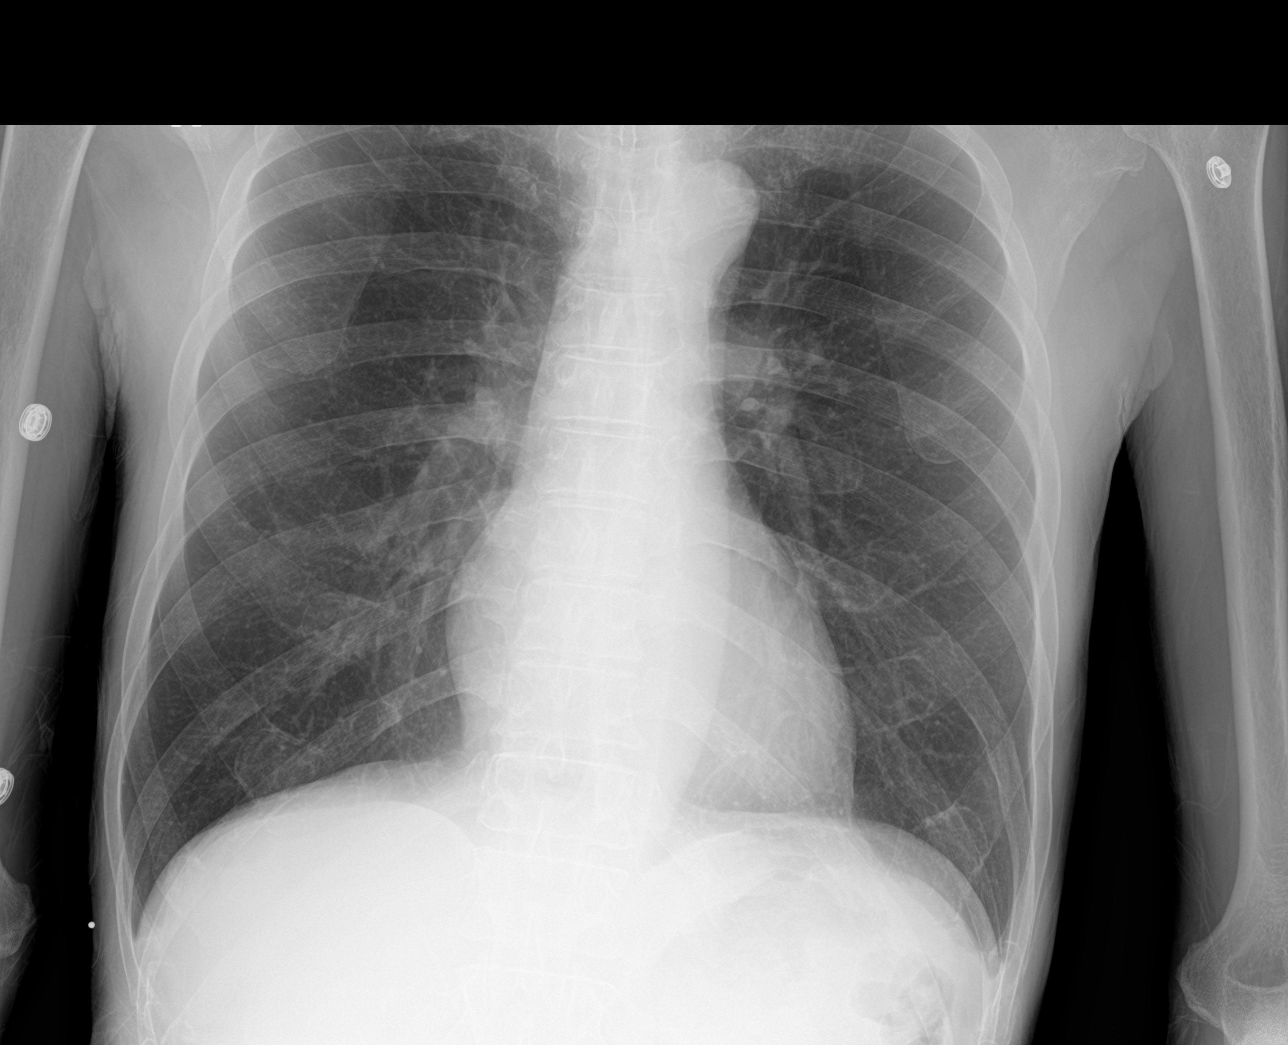

[3 of 3 positions shown; findings below may reference images not displayed]

FINDINGS: There is a subtle fracture anterior aspect of the right eighth rib
just lateral to the costochondral junction. There is a minimal
deformity of the anterolateral aspect of the right sixth rib which I
doubt represents a fracture.

The heart size and pulmonary vascularity are normal. No
pneumothorax. No effusions.
IMPRESSION: Fracture of the anterolateral aspect of the right eighth rib.

## 2021-06-15 ENCOUNTER — Encounter: Payer: Self-pay | Admitting: *Deleted

## 2021-06-15 ENCOUNTER — Other Ambulatory Visit: Payer: Self-pay

## 2021-06-15 ENCOUNTER — Emergency Department (HOSPITAL_COMMUNITY): Payer: Medicare Other

## 2021-06-15 ENCOUNTER — Encounter (HOSPITAL_COMMUNITY): Payer: Self-pay

## 2021-06-15 ENCOUNTER — Emergency Department (HOSPITAL_COMMUNITY)
Admission: EM | Admit: 2021-06-15 | Discharge: 2021-06-15 | Disposition: A | Discharge status: Home / Self Care | Payer: Medicare Other | Attending: Emergency Medicine | Admitting: Emergency Medicine

## 2021-06-15 DIAGNOSIS — I10 Essential (primary) hypertension: Secondary | ICD-10-CM | POA: Insufficient documentation

## 2021-06-15 DIAGNOSIS — Y906 Blood alcohol level of 120-199 mg/100 ml: Secondary | ICD-10-CM | POA: Insufficient documentation

## 2021-06-15 DIAGNOSIS — Z20822 Contact with and (suspected) exposure to covid-19: Secondary | ICD-10-CM | POA: Diagnosis not present

## 2021-06-15 DIAGNOSIS — F17210 Nicotine dependence, cigarettes, uncomplicated: Secondary | ICD-10-CM | POA: Diagnosis not present

## 2021-06-15 DIAGNOSIS — Z79899 Other long term (current) drug therapy: Secondary | ICD-10-CM | POA: Diagnosis not present

## 2021-06-15 DIAGNOSIS — I4891 Unspecified atrial fibrillation: Secondary | ICD-10-CM | POA: Diagnosis not present

## 2021-06-15 DIAGNOSIS — R000 Tachycardia, unspecified: Secondary | ICD-10-CM | POA: Diagnosis present

## 2021-06-15 LAB — COMPREHENSIVE METABOLIC PANEL
ALT: 18 U/L (ref 0–44)
AST: 47 U/L — ABNORMAL HIGH (ref 15–41)
Albumin: 5 g/dL (ref 3.5–5.0)
Alkaline Phosphatase: 86 U/L (ref 38–126)
Anion gap: 16 — ABNORMAL HIGH (ref 5–15)
BUN: 12 mg/dL (ref 8–23)
CO2: 23 mmol/L (ref 22–32)
Calcium: 9.4 mg/dL (ref 8.9–10.3)
Chloride: 94 mmol/L — ABNORMAL LOW (ref 98–111)
Creatinine, Ser: 1 mg/dL (ref 0.61–1.24)
GFR, Estimated: >60 mL/min (ref >60)
Glucose, Bld: 99 mg/dL (ref 70–99)
Potassium: 4.6 mmol/L (ref 3.5–5.1)
Sodium: 133 mmol/L — ABNORMAL LOW (ref 135–145)
Total Bilirubin: 1.1 mg/dL (ref 0.3–1.2)
Total Protein: 8.8 g/dL — ABNORMAL HIGH (ref 6.5–8.1)

## 2021-06-15 LAB — CBC WITH DIFFERENTIAL/PLATELET
Abs Immature Granulocytes: 0.03 10*3/uL (ref 0.00–0.07)
Basophils Absolute: 0 10*3/uL (ref 0.0–0.1)
Basophils Relative: 0 %
Eosinophils Absolute: 0.1 10*3/uL (ref 0.0–0.5)
Eosinophils Relative: 2 %
HCT: 49 % (ref 39.0–52.0)
Hemoglobin: 16.1 g/dL (ref 13.0–17.0)
Immature Granulocytes: 1 %
Lymphocytes Relative: 24 %
Lymphs Abs: 1.3 10*3/uL (ref 0.7–4.0)
MCH: 32.2 pg (ref 26.0–34.0)
MCHC: 32.9 g/dL (ref 30.0–36.0)
MCV: 98 fL (ref 80.0–100.0)
Monocytes Absolute: 0.4 10*3/uL (ref 0.1–1.0)
Monocytes Relative: 8 %
Neutro Abs: 3.5 10*3/uL (ref 1.7–7.7)
Neutrophils Relative %: 65 %
Platelets: 171 10*3/uL (ref 150–400)
RBC: 5 MIL/uL (ref 4.22–5.81)
RDW: 14.3 % (ref 11.5–15.5)
WBC: 5.3 10*3/uL (ref 4.0–10.5)
nRBC: 0 % (ref 0.0–0.2)

## 2021-06-15 LAB — TROPONIN I (HIGH SENSITIVITY)
Troponin I (High Sensitivity): 19 ng/L — ABNORMAL HIGH (ref <18)
Troponin I (High Sensitivity): 24 ng/L — ABNORMAL HIGH (ref <18)

## 2021-06-15 LAB — LIPASE, BLOOD: Lipase: 49 U/L (ref 11–51)

## 2021-06-15 LAB — MAGNESIUM: Magnesium: 2.6 mg/dL — ABNORMAL HIGH (ref 1.7–2.4)

## 2021-06-15 LAB — CBG MONITORING, ED: Glucose-Capillary: 102 mg/dL — ABNORMAL HIGH (ref 70–99)

## 2021-06-15 LAB — RESP PANEL BY RT-PCR (FLU A&B, COVID) ARPGX2
Influenza A by PCR: NEGATIVE
Influenza B by PCR: NEGATIVE
SARS Coronavirus 2 by RT PCR: NEGATIVE

## 2021-06-15 LAB — ETHANOL: Alcohol, Ethyl (B): 123 mg/dL — ABNORMAL HIGH (ref <10)

## 2021-06-15 MED ORDER — DILTIAZEM HCL 25 MG/5ML IV SOLN
10.0000 mg | Freq: Once | INTRAVENOUS | Status: AC
  Administered 2021-06-15: 10 mg via INTRAVENOUS
  Filled 2021-06-15: qty 5

## 2021-06-15 MED ORDER — DILTIAZEM HCL-DEXTROSE 125-5 MG/125ML-% IV SOLN (PREMIX)
5.0000 mg/h | INTRAVENOUS | Status: DC
Start: 2021-06-15 — End: 2021-06-15
  Administered 2021-06-15: 5 mg/h via INTRAVENOUS
  Filled 2021-06-15: qty 125

## 2021-06-15 NOTE — ED Triage Notes (Signed)
Pt BIB EMS. Pt states that he is cold and not feeling good since tonight.

## 2021-06-15 NOTE — ED Notes (Addendum)
Pt reports doing crack tonight.

## 2021-06-15 NOTE — Congregational Nurse Program (Signed)
  Dept: [phone]   Congregational Nurse Program Note  Date of Encounter: 06/15/2021  Past Medical History: Past Medical History:  Diagnosis Date   Alcohol dependence (HCC)    Diverticulosis    Enlarged prostate    ETOH abuse    PT drinks daily    Hypertension    Major depression, chronic     Encounter Details:  CNP Questionnaire - 06/15/21 1308       Questionnaire   Do you give verbal consent to treat you today? Yes    Location Patient Served  [facility]    [place] [address]Phone/Text/Email    Patient Status Homeless    [employer];Medicare    Insurance Referral N/A    Medication N/A    Medical Provider Yes    Screening Referrals N/A    Medical Referral Non-[facility] P[facility]inic    Medical Appointment Made Non-[facility] PCP[facility]clinic    Food N/A    Transportation Need transportation assistance    Housing/Utilities No permanent housing    Interpersonal Safety Do not feel safe at current residence    Intervention Blood pressure;Support;Educate;Advocate    ED Visit Averted N/A    Life-Saving Intervention Made N/A            Client came in nurse's office to have his blood pressure checked. While assessing, saw a bloody bandage on client's arm where he had had a needle stick. Cleaned area with alcohol swab and bleeding had stopped. Vitals checked and BP was elevated. Contacted [facility] where client is established. Gave information about client's blood pressure and asked for an appt. Client ran out of his lisinopril last week. Per call to doctor's office next available in person appt is [facility] 4[facility] at 2:30  at the [employer]. Scheduled the appt and educated client about elevated blood pressure and risk for stroke. Also asked receptionist to request doctor send prescription for his lisinopril until he can see the MD at the [facility] appt. Jasey Cortez W RN CN [phone] Dary Dilauro W RN CN

## 2021-06-15 NOTE — ED Provider Notes (Signed)
[facility] DEPT Provider Note   CSN: [id number] Arrival date & time: 06/15/21  0220     History Chief Complaint  Patient presents with   Cold Exposure   Tachycardia    Larry Byrd is a 65 y.o. male.  The history is provided by the patient, medical records and the EMS personnel.  Larry Byrd is a 65 y.o. male who presents to the Emergency Department complaining of cold and malaise. He presents the emergency department by EMS for evaluation of feeling cold. EMS was called when the patient was standing outside of the hotel. He states that he is feeling cold and unwell, started today. Denies any associated chest pain. He does have nausea. He drinks alcohol daily and did have two 40s today. He has mild associated cough.    Past Medical History:  Diagnosis Date   Alcohol dependence ([place])    Diverticulosis    Enlarged prostate    ETOH abuse    PT drinks daily    Hypertension    Major depression, chronic     Patient Active Problem List   Diagnosis Date Noted   High anion gap metabolic acidosis 123456   Oropharyngeal dysphagia 07/10/2019   Alcohol withdrawal seizure ([place]) 05/14/2019   Malnutrition of moderate degree 09/13/2017   Hypertension 09/10/2017   AKI (acute kidney injury) ([place]) 09/10/2017   Chest pain 09/10/2017   Hyponatremia 09/10/2017   Hypotension 09/10/2017   Near syncope 09/10/2017   Neck pain 09/10/2017    History reviewed. No pertinent surgical history.     History reviewed. No pertinent family history.  Social History   Tobacco Use   Smoking status: Every Day    Types: Cigarettes   Smokeless tobacco: Never  Vaping Use   Vaping Use: Never used  Substance Use Topics   Alcohol use: Yes   Drug use: No    Home Medications Prior to Admission medications   Medication Sig Start Date End Date Taking? Authorizing Provider  chlordiazePOXIDE (LIBRIUM) 25 MG capsule 50mg  PO TID x 1D, then 25-50mg  PO BID X 1D,  then 25-50mg  PO QD X 1D Patient not taking: Reported on 11/28/2019 09/29/19   Hunter, [place], MD  folic acid (FOLVITE) 1 MG tablet Take 1 tablet (1 mg total) by mouth daily. Patient not taking: Reported on 09/28/2019 07/13/19   Mitzi Hansen, MD  naltrexone (DEPADE) 50 MG tablet Take 1 tablet (50 mg total) by mouth daily. Patient not taking: Reported on 09/28/2019 07/12/19   Mitzi Hansen, MD  ondansetron (ZOFRAN ODT) 4 MG disintegrating tablet Take 1 tablet (4 mg total) by mouth every 8 (eight) hours as needed for nausea or vomiting. 10/06/20   Kugler, [place], MD  thiamine 100 MG tablet Take 1 tablet (100 mg total) by mouth daily. Patient not taking: Reported on 09/28/2019 07/13/19   Mitzi Hansen, MD  tolnaftate (TINACTIN) 1 % cream Apply 1 application topically 2 (two) times daily. Patient not taking: Reported on 11/28/2019 09/29/19   Fatima Blank, MD  amLODipine (NORVASC) 5 MG tablet Take 1 tablet (5 mg total) by mouth daily. Patient not taking: Reported on 03/29/2019 10/08/17 08/13/19  Nat Christen, MD  lisinopril-hydrochlorothiazide (PRINZIDE,ZESTORETIC) 20-12.5 MG tablet Take 1 tablet by mouth daily. Patient not taking: Reported on 03/29/2019 08/11/17 08/13/19  Fransico Meadow, PA-C  potassium chloride SA (K-DUR,KLOR-CON) 20 MEQ tablet Take 2 tablets (40 mEq total) by mouth daily. Patient not taking: Reported on 03/29/2019 08/11/17 08/13/19  Fransico Meadow, PA-C  Allergies    Patient has no known allergies.  Review of Systems   Review of Systems  All other systems reviewed and are negative.  Physical Exam Updated Vital Signs BP 137/78   Pulse 69   Temp 98 F (36.7 C) (Oral)   Resp 16   Ht 5\' 10"  (1.778 m)   Wt 59 kg   SpO2 98%   BMI 18.65 kg/m   Physical Exam Vitals and nursing note reviewed.  Constitutional:      Appearance: He is well-developed.  HENT:     Head: Normocephalic and atraumatic.  Cardiovascular:     Rate and Rhythm: Regular rhythm. Tachycardia present.      Heart sounds: No murmur heard. Pulmonary:     Effort: Pulmonary effort is normal. No respiratory distress.     Breath sounds: Normal breath sounds.  Abdominal:     Palpations: Abdomen is soft.     Tenderness: There is no abdominal tenderness. There is no guarding or rebound.  Musculoskeletal:        General: No swelling or tenderness.  Skin:    General: Skin is warm and dry.  Neurological:     Mental Status: He is alert and oriented to person, place, and time.  Psychiatric:        Behavior: Behavior normal.    ED Results / Procedures / Treatments   Labs (all labs ordered are listed, but only abnormal results are displayed) Labs Reviewed  COMPREHENSIVE METABOLIC PANEL - Abnormal; Notable for the following components:      Result Value   Sodium 133 (*)    Chloride 94 (*)    Total Protein 8.8 (*)    AST 47 (*)    Anion gap 16 (*)    All other components within normal limits  ETHANOL - Abnormal; Notable for the following components:   Alcohol, Ethyl (B) 123 (*)    All other components within normal limits  MAGNESIUM - Abnormal; Notable for the following components:   Magnesium 2.6 (*)    All other components within normal limits  CBG MONITORING, ED - Abnormal; Notable for the following components:   Glucose-Capillary 102 (*)    All other components within normal limits  TROPONIN I (HIGH SENSITIVITY) - Abnormal; Notable for the following components:   Troponin I (High Sensitivity) 19 (*)    All other components within normal limits  RESP PANEL BY RT-PCR (FLU A&B, COVID) ARPGX2  CBC WITH DIFFERENTIAL/PLATELET  LIPASE, BLOOD  URINALYSIS, ROUTINE W REFLEX MICROSCOPIC  TROPONIN I (HIGH SENSITIVITY)  TROPONIN I (HIGH SENSITIVITY)    EKG EKG Interpretation  Date/Time:  Monday June 15 2021 03:17:01 EST Ventricular Rate:  81 PR Interval:  145 QRS Duration: 91 QT Interval:  375 QTC Calculation: 436 R Axis:   91 Text Interpretation: Sinus rhythm Atrial premature  complex Right axis deviation Confirmed by Quintella Reichert [phone]) on 06/15/2021 3:46:34 AM  Radiology DG Chest 2 View  Result Date: 06/15/2021 CLINICAL DATA:  Cough EXAM: CHEST - 2 VIEW COMPARISON:  10/06/2020 FINDINGS: The heart size and mediastinal contours are within normal limits. Both lungs are hyperinflated. The visualized skeletal structures are unremarkable. Bilateral nipple shadows are noted. Stable midthoracic compression deformities are noted. IMPRESSION: No acute abnormality noted. Electronically Signed   By: Inez Catalina M.D.   On: 06/15/2021 03:06    Procedures Procedures  CRITICAL CARE Performed by: Quintella Reichert   Total critical care time: 35 minutes  Critical care time was  exclusive of separately billable procedures and treating other patients.  Critical care was necessary to treat or prevent imminent or life-threatening deterioration.  Critical care was time spent personally by me on the following activities: development of treatment plan with patient and/or surrogate as well as nursing, discussions with consultants, evaluation of patient's response to treatment, examination of patient, obtaining history from patient or surrogate, ordering and performing treatments and interventions, ordering and review of laboratory studies, ordering and review of radiographic studies, pulse oximetry and re-evaluation of patient's condition.  Medications Ordered in ED Medications  diltiazem (CARDIZEM) 125 mg in dextrose 5% 125 mL (1 mg/mL) infusion (0 mg/hr Intravenous Stopped 06/15/21 0316)  diltiazem (CARDIZEM) injection 10 mg (10 mg Intravenous Given 06/15/21 0301)    ED Course  I have reviewed the triage vital signs and the nursing notes.  Pertinent labs & imaging results that were available during my care of the patient were reviewed by me and considered in my medical decision making (see chart for details).    MDM Rules/Calculators/A&P                          patient  with history of alcohol abuse here for evaluation of feeling unwell. He did use cocaine earlier today. On ED presentation patient in a fib with RVR. He was treated with bolus of diltiazem with conversion to sinus rhythm. He did report feeling improved after conversion to sinus rhythm.  CHA2DS2/VAS Stroke Risk Points  Current as of 3 minutes ago     2 >= 2 Points: High Risk  1 - 1.99 Points: Medium Risk  0 Points: Low Risk    No Change      Details    This score determines the patient's risk of having a stroke if the  patient has atrial fibrillation.       Points Metrics  0 Has Congestive Heart Failure:  No    Current as of 3 minutes ago  0 Has Vascular Disease:  No    Current as of 3 minutes ago  1 Has Hypertension:  Yes    Current as of 3 minutes ago  1 Age:  51    Current as of 3 minutes ago  0 Has Diabetes:  No    Current as of 3 minutes ago  0 Had Stroke:  No  Had TIA:  No  Had Thromboembolism:  No    Current as of 3 minutes ago  0 Male:  No    Current as of 3 minutes ago          discussed with on-call cardiologist, as long as troponins are stable and patient is asymptomatic he may be considered for outpatient follow-up. He is not an anticoagulation candidate due to his substance use. Patient care transferred pending repeat troponin.  Final Clinical Impression(s) / ED Diagnoses Final diagnoses:  Atrial fibrillation with RVR [facility])    Rx / DC Orders ED Discharge Orders          Ordered    Amb Referral to AFIB Clinic        06/15/21 0738             Tilden Fossa, MD 06/15/21 0740

## 2021-06-15 NOTE — ED Provider Notes (Deleted)
Emergency Medicine Provider Triage Evaluation Note  Larry Byrd , a 65 y.o. male  was evaluated in triage.  Pt complains of malaise.  He presents to the ED by EMS for feeling unwell and cold since being out in the cold.  Complains of cough and nausea.  No pain.  Drinks alcohol daily, last drink today.  Review of Systems  Positive: Cough, nausea Negative: Chest pain, abdominal pain  Physical Exam  BP (!) 109/97 (BP Location: Left Arm)   Pulse 89   Temp 98 F (36.7 C) (Oral)   Resp 15   Ht 5\' 10"  (1.778 m)   Wt 59 kg   SpO2 99%   BMI 18.65 kg/m  Gen:   Awake, no distress   Resp:  Normal effort  MSK:   Moves extremities without difficulty  Other:    Medical Decision Making  Medically screening exam initiated at 2:35 AM.  Appropriate orders placed.  Larry Byrd was informed that the remainder of the evaluation will be completed by another provider, this initial triage assessment does not replace that evaluation, and the importance of remaining in the ED until their evaluation is complete.     Larry Phy, MD 06/15/21 [phone]

## 2021-06-24 ENCOUNTER — Encounter (HOSPITAL_COMMUNITY): Payer: Self-pay | Admitting: *Deleted

## 2021-11-12 ENCOUNTER — Emergency Department (HOSPITAL_COMMUNITY)
Admission: EM | Admit: 2021-11-12 | Discharge: 2021-11-13 | Disposition: A | Discharge status: Home / Self Care | Payer: Medicare Other | Attending: Emergency Medicine | Admitting: Emergency Medicine

## 2021-11-12 ENCOUNTER — Emergency Department (HOSPITAL_COMMUNITY): Payer: Medicare Other

## 2021-11-12 ENCOUNTER — Other Ambulatory Visit: Payer: Self-pay

## 2021-11-12 ENCOUNTER — Encounter (HOSPITAL_COMMUNITY): Payer: Self-pay

## 2021-11-12 DIAGNOSIS — N179 Acute kidney failure, unspecified: Secondary | ICD-10-CM

## 2021-11-12 DIAGNOSIS — R630 Anorexia: Secondary | ICD-10-CM | POA: Insufficient documentation

## 2021-11-12 DIAGNOSIS — R569 Unspecified convulsions: Secondary | ICD-10-CM

## 2021-11-12 DIAGNOSIS — E876 Hypokalemia: Secondary | ICD-10-CM | POA: Insufficient documentation

## 2021-11-12 DIAGNOSIS — R1110 Vomiting, unspecified: Secondary | ICD-10-CM

## 2021-11-12 DIAGNOSIS — R1084 Generalized abdominal pain: Secondary | ICD-10-CM

## 2021-11-12 DIAGNOSIS — U071 COVID-19: Secondary | ICD-10-CM

## 2021-11-12 LAB — BASIC METABOLIC PANEL
Anion gap: 27 — ABNORMAL HIGH (ref 5–15)
BUN: 6 mg/dL — ABNORMAL LOW (ref 8–23)
CO2: 15 mmol/L — ABNORMAL LOW (ref 22–32)
Calcium: 10.1 mg/dL (ref 8.9–10.3)
Chloride: 88 mmol/L — ABNORMAL LOW (ref 98–111)
Creatinine, Ser: 1.69 mg/dL — ABNORMAL HIGH (ref 0.61–1.24)
GFR, Estimated: 44 mL/min — ABNORMAL LOW (ref >60)
Glucose, Bld: 102 mg/dL — ABNORMAL HIGH (ref 70–99)
Potassium: 3.7 mmol/L (ref 3.5–5.1)
Sodium: 130 mmol/L — ABNORMAL LOW (ref 135–145)

## 2021-11-12 LAB — CBC
HCT: 38.6 % — ABNORMAL LOW (ref 39.0–52.0)
Hemoglobin: 13 g/dL (ref 13.0–17.0)
MCH: 35.8 pg — ABNORMAL HIGH (ref 26.0–34.0)
MCHC: 33.7 g/dL (ref 30.0–36.0)
MCV: 106.3 fL — ABNORMAL HIGH (ref 80.0–100.0)
Platelets: 89 10*3/uL — ABNORMAL LOW (ref 150–400)
RBC: 3.63 MIL/uL — ABNORMAL LOW (ref 4.22–5.81)
RDW: 14.7 % (ref 11.5–15.5)
WBC: 5.9 10*3/uL (ref 4.0–10.5)
nRBC: 0 % (ref 0.0–0.2)

## 2021-11-12 LAB — CBG MONITORING, ED: Glucose-Capillary: 112 mg/dL — ABNORMAL HIGH (ref 70–99)

## 2021-11-12 MED ORDER — ONDANSETRON HCL 4 MG/2ML IJ SOLN
4.0000 mg | Freq: Once | INTRAMUSCULAR | Status: AC
  Administered 2021-11-13: 4 mg via INTRAVENOUS
  Filled 2021-11-12: qty 2

## 2021-11-12 MED ORDER — SODIUM CHLORIDE 0.9 % IV BOLUS
1000.0000 mL | Freq: Once | INTRAVENOUS | Status: AC
  Administered 2021-11-13: 1000 mL via INTRAVENOUS

## 2021-11-12 NOTE — ED Notes (Signed)
Provider at bedside at this time to assess patient 

## 2021-11-12 NOTE — ED Triage Notes (Signed)
[place] EMS [place] EMS reports no fall or obvious injuries. Per EMS the "significant other" reports that the patient does drink often and had multiple seizures today. EMS reports family member stated that the patient was seen "taking something at the neighbors house" and started acting weird after. Patients VS within normal limits with EMS; BP 156/100 HR 106 sp02 98% CBG 112 ?

## 2021-11-12 NOTE — ED Provider Notes (Signed)
?[facility] ?Provider Note ? ? ?CSN: [id number] ?Arrival date & time: 11/12/21  1939 ? ?  ? ?History ? ?Chief Complaint  ?Patient presents with  ? Altered Mental Status  ?  Altered mental status per ems + ETOH and EMS reports seizure  ? ? ?Gillermina PhyWilliam Frasier is a 66 y.o. male. ? ?HPI ?Patient presents by EMS for evaluation of seizure.  His partner is with him and states he had a seizure after several episodes of vomiting.  Patient complains of abdominal pain which has been going on for several days.  He has had decreased appetite these last few days.  He drinks alcohol daily but not usually more than 2 or 3 drinks.  He has never had DTs.  No recent trauma.  He denies headache, back pain, focal weakness or paresthesia. ?  ? ?Home Medications ?Prior to Admission medications   ?Medication Sig Start Date End Date Taking? Authorizing Provider  ?chlordiazePOXIDE (LIBRIUM) 25 MG capsule 50mg  PO TID x 1D, then 25-50mg  PO BID X 1D, then 25-50mg  PO QD X 1D ?Patient not taking: Reported on 11/28/2019 09/29/19   Hunter, [place], MD  ?folic acid (FOLVITE) 1 MG tablet Take 1 tablet (1 mg total) by mouth daily. ?Patient not taking: Reported on 09/28/2019 07/13/19   Elige Radonhristian, Rylee, MD  ?naltrexone (DEPADE) 50 MG tablet Take 1 tablet (50 mg total) by mouth daily. ?Patient not taking: Reported on 09/28/2019 07/12/19   Elige Radonhristian, Rylee, MD  ?ondansetron (ZOFRAN ODT) 4 MG disintegrating tablet Take 1 tablet (4 mg total) by mouth every 8 (eight) hours as needed for nausea or vomiting. 10/06/20   Kugler, [place], MD  ?thiamine 100 MG tablet Take 1 tablet (100 mg total) by mouth daily. ?Patient not taking: Reported on 09/28/2019 07/13/19   Elige Radonhristian, Rylee, MD  ?tolnaftate (TINACTIN) 1 % cream Apply 1 application topically 2 (two) times daily. ?Patient not taking: Reported on 11/28/2019 09/29/19   Nira Connardama, Pedro Eduardo, MD  ?amLODipine (NORVASC) 5 MG tablet Take 1 tablet (5 mg total) by mouth daily. ?Patient not  taking: Reported on 03/29/2019 10/08/17 08/13/19  Donnetta Hutchingook, Brian, MD  ?lisinopril-hydrochlorothiazide (PRINZIDE,ZESTORETIC) 20-12.5 MG tablet Take 1 tablet by mouth daily. ?Patient not taking: Reported on 03/29/2019 08/11/17 08/13/19  Elson AreasSofia, Leslie K, PA-C  ?potassium chloride SA (K-DUR,KLOR-CON) 20 MEQ tablet Take 2 tablets (40 mEq total) by mouth daily. ?Patient not taking: Reported on 03/29/2019 08/11/17 08/13/19  Elson AreasSofia, Leslie K, PA-C  ?   ? ?Allergies    ?Patient has no known allergies.   ? ?Review of Systems   ?Review of Systems ? ?Physical Exam ?Updated Vital Signs ?BP 137/89   Pulse 88   Temp 99 ?F (37.2 ?C) (Oral)   Resp (!) 22   Ht 5\' 6"  (1.676 m)   Wt 63.5 kg   SpO2 97%   BMI 22.60 kg/m?  ?Physical Exam ?Vitals and nursing note reviewed.  ?Constitutional:   ?   General: He is not in acute distress. ?   Appearance: He is well-developed. He is not ill-appearing or diaphoretic.  ?HENT:  ?   Head: Normocephalic and atraumatic.  ?   Right Ear: External ear normal.  ?   Left Ear: External ear normal.  ?Eyes:  ?   Conjunctiva/sclera: Conjunctivae normal.  ?   Pupils: Pupils are equal, round, and reactive to light.  ?Neck:  ?   Trachea: Phonation normal.  ?Cardiovascular:  ?   Rate and Rhythm: Normal rate.  ?  Pulmonary:  ?   Effort: Pulmonary effort is normal.  ?Abdominal:  ?   Tenderness: There is no abdominal tenderness.  ?Musculoskeletal:     ?   General: Normal range of motion.  ?   Cervical back: Normal range of motion and neck supple.  ?Skin: ?   General: Skin is warm and dry.  ?Neurological:  ?   Mental Status: He is alert and oriented to person, place, and time.  ?   Cranial Nerves: No cranial nerve deficit.  ?   Sensory: No sensory deficit.  ?   Motor: No abnormal muscle tone.  ?   Coordination: Coordination normal.  ?Psychiatric:     ?   Mood and Affect: Mood normal.     ?   Behavior: Behavior normal.     ?   Thought Content: Thought content normal.     ?   Judgment: Judgment normal.  ? ? ?ED Results /  Procedures / Treatments   ?Labs ?(all labs ordered are listed, but only abnormal results are displayed) ?Labs Reviewed  ?CBC - Abnormal; Notable for the following components:  ?    Result Value  ? RBC 3.63 (*)   ? HCT 38.6 (*)   ? MCV 106.3 (*)   ? MCH 35.8 (*)   ? Platelets 89 (*)   ? All other components within normal limits  ?BASIC METABOLIC PANEL - Abnormal; Notable for the following components:  ? Sodium 130 (*)   ? Chloride 88 (*)   ? CO2 15 (*)   ? Glucose, Bld 102 (*)   ? BUN 6 (*)   ? Creatinine, Ser 1.69 (*)   ? GFR, Estimated 44 (*)   ? Anion gap 27 (*)   ? All other components within normal limits  ?CBG MONITORING, ED - Abnormal; Notable for the following components:  ? Glucose-Capillary 112 (*)   ? All other components within normal limits  ? ? ?EKG ?EKG Interpretation ? ?Date/Time:  Thursday November 12 2021 20:00:22 EDT ?Ventricular Rate:  99 ?PR Interval:  108 ?QRS Duration: 92 ?QT Interval:  364 ?QTC Calculation: 468 ?R Axis:   80 ?Text Interpretation: Sinus rhythm Minimal ST depression, inferior leads since last tracing no significant change Confirmed by Mancel Bale [phone]) on 11/12/2021 11:16:44 PM ? ?Radiology ?No results found. ? ?Procedures ?Procedures  ? ? ?Medications Ordered in ED ?Medications - No data to display ? ?ED Course/ Medical Decision Making/ A&P ?  ?                        ?Medical Decision Making ?Elderly male, here with his partner, they live in a hotel.  [place] when he had a seizure after some episodes of vomiting.  He has not been feeling well the last few days and having abdominal pain. ? ?Problems Addressed: ?AKI (acute kidney injury) [facility]): acute illness or injury ?Generalized abdominal pain: acute illness or injury ?Vomiting, unspecified vomiting type, unspecified whether nausea present: acute illness or injury ? ?Amount and/or Complexity of Data Reviewed ?Independent Historian:  ?   Details: He is a cogent historian.  Partner gives  additional history at the bedside. ?Labs: ordered. ?   Details: CBC, metabolic panel, lipase-normal except sodium low, chloride low, CO2 low, creatinine high, GFR low ?Radiology: ordered and independent interpretation performed. ?   Details: Chest x-ray, CT abdomen pelvis-pending at time of transition of  care ?ECG/medicine tests: ordered and independent interpretation performed. ?   Details: Cardiac monitor-normal sinus rhythm ? ?Risk ?OTC drugs. ?Prescription drug management. ?Decision regarding hospitalization. ?Risk Details: Patient presenting with vomiting and seizure.  He also has abdominal pain.  He apparently drinks a fair amount of alcohol, but denies being alcoholic.  He has previously been treated with Librium for alcohol withdrawal symptoms.  Patient seizure today was isolated single event.  He recovered his normal sensorium quickly.  Screening evaluation undertaken to evaluate for causes of abdominal pain and vomiting.  Care transferred to Dr. Oletta Cohn, at 11:45 PM. ? ? ? ? ? ? ? ? ? ? ?Final Clinical Impression(s) / ED Diagnoses ?Final diagnoses:  ?Vomiting, unspecified vomiting type, unspecified whether nausea present  ?Generalized abdominal pain  ?AKI (acute kidney injury) (HCC)  ?Seizure (HCC)  ?COVID-19  ? ? ?Rx / DC Orders ?ED Discharge Orders   ? ? None  ? ?  ? ? ?  ?Mancel Bale, MD ?11/14/21 1321 ? ?

## 2021-11-12 NOTE — ED Notes (Signed)
Patient taken to imaging at this time.

## 2021-11-13 DIAGNOSIS — R569 Unspecified convulsions: Secondary | ICD-10-CM | POA: Diagnosis not present

## 2021-11-13 LAB — RESP PANEL BY RT-PCR (FLU A&B, COVID) ARPGX2
Influenza A by PCR: NEGATIVE
Influenza B by PCR: NEGATIVE
SARS Coronavirus 2 by RT PCR: POSITIVE — AB

## 2021-11-13 MED ORDER — NIRMATRELVIR/RITONAVIR (PAXLOVID) TABLET (RENAL DOSING)
2.0000 | ORAL_TABLET | Freq: Two times a day (BID) | ORAL | Status: DC
  Administered 2021-11-13: 2 via ORAL
  Filled 2021-11-13: qty 20

## 2021-11-13 MED ORDER — ACETAMINOPHEN 325 MG PO TABS
650.0000 mg | ORAL_TABLET | Freq: Once | ORAL | Status: AC
  Administered 2021-11-13: 650 mg via ORAL
  Filled 2021-11-13: qty 2

## 2021-11-13 MED ORDER — IOHEXOL 300 MG/ML  SOLN
80.0000 mL | Freq: Once | INTRAMUSCULAR | Status: AC | PRN
  Administered 2021-11-13: 80 mL via INTRAVENOUS

## 2021-11-13 NOTE — ED Notes (Signed)
Pt has returned from CT.  

## 2021-11-13 NOTE — ED Provider Notes (Signed)
Patient signed out to me by Dr. Eulis Foster.  Patient seen with possible seizure.  Patient noted to have abdominal pain with nausea and vomiting upon arrival.  At time of signout to me he had CT abdomen and pelvis pending.  This was read as negative by radiology. ? ?Patient did develop a low-grade fever here.  Fever was explained by his positive COVID test.  Due to his age and comorbidities, will treat with Paxlovid. ? ?Patient appears well on reexamination.  No signs of significant withdrawal at this time.  We will arrange for discharge. ?  ?Orpah Greek, MD ?11/13/21 0259 ? ?
